# Patient Record
Sex: Male | Born: 1960 | Race: White | Hispanic: No | Marital: Single | Smoking: Never smoker
Health system: Southern US, Community
[De-identification: ages and names within clinical notes are randomized; demographics above are authoritative.]

## PROBLEM LIST (undated history)

## (undated) DIAGNOSIS — F79 Unspecified intellectual disabilities: Secondary | ICD-10-CM

## (undated) DIAGNOSIS — K219 Gastro-esophageal reflux disease without esophagitis: Secondary | ICD-10-CM

## (undated) DIAGNOSIS — Q909 Down syndrome, unspecified: Secondary | ICD-10-CM

---

## 2006-08-20 ENCOUNTER — Observation Stay (HOSPITAL_COMMUNITY): Admission: EM | Admit: 2006-08-20 | Discharge: 2006-08-21 | Payer: Self-pay | Admitting: Emergency Medicine

## 2009-01-17 ENCOUNTER — Encounter: Admission: RE | Admit: 2009-01-17 | Discharge: 2009-01-17 | Payer: Self-pay | Admitting: Family Medicine

## 2009-07-19 ENCOUNTER — Emergency Department (HOSPITAL_COMMUNITY): Admission: EM | Admit: 2009-07-19 | Discharge: 2009-07-19 | Payer: Self-pay | Admitting: Emergency Medicine

## 2010-11-03 ENCOUNTER — Encounter: Payer: Self-pay | Admitting: Family Medicine

## 2011-01-16 LAB — LIPASE, BLOOD: Lipase: 18 U/L (ref 11–59)

## 2011-01-16 LAB — DIFFERENTIAL
Lymphocytes Relative: 5 % — ABNORMAL LOW (ref 12–46)
Monocytes Absolute: 0.6 10*3/uL (ref 0.1–1.0)
Neutrophils Relative %: 91 % — ABNORMAL HIGH (ref 43–77)

## 2011-01-16 LAB — CBC
MCHC: 34.4 g/dL (ref 30.0–36.0)
MCV: 97.1 fL (ref 78.0–100.0)
Platelets: 271 10*3/uL (ref 150–400)
WBC: 17.3 10*3/uL — ABNORMAL HIGH (ref 4.0–10.5)

## 2011-01-16 LAB — COMPREHENSIVE METABOLIC PANEL
AST: 24 U/L (ref 0–37)
BUN: 14 mg/dL (ref 6–23)
CO2: 27 mEq/L (ref 19–32)
Chloride: 107 mEq/L (ref 96–112)
GFR calc Af Amer: >60 mL/min (ref >60)
GFR calc non Af Amer: >60 mL/min (ref >60)
Glucose, Bld: 118 mg/dL — ABNORMAL HIGH (ref 70–99)
Sodium: 142 mEq/L (ref 135–145)

## 2011-02-28 NOTE — H&P (Signed)
NAME:  Brian Stanton, Brian Stanton NO.:  [id number]   MEDICAL RECORD NO.:  [id number]          PATIENT TYPE:  EMS   LOCATION:  MAJO                         FACILITY:  [facility]   PHYSICIAN:  Marcellus [name], MD     DATE OF BIRTH:  [birth year]   DATE OF ADMISSION:  08/20/2006  DATE OF DISCHARGE:                                HISTORY & PHYSICAL   PRIMARY CARE PHYSICIAN:  Dr. Arlyce Dice   The patient is a resident of [facility].   Historians: patient's mother and the supervisor of the day program, first  shift.   CHIEF COMPLAINT:  Intractable Vomiting and clenching of the chest.   HISTORY OF PRESENT ILLNESS:  Brian Stanton, a 50 year old patient with history of Down syndrome, resident of  the [facility], is unable to provide any history because of his Down  syndrome and history of speaking minimally.  History obtained from the  historians. Patient was apparently in usual state of health until 6 pm last  night when multiple episodes of vomiting bilious material which went on  until this am. However there was no history of blood or coffee-ground  material. Unsure if the patient had any history of nausea, but there was no  history of any fevers, chills, or rigors.  No history of diarrhea or  constipation and nothing to indicate abdominal pain.  It is not clear  exactly how many times the patient vomited and the timing of the last  vomitus.  However, per the historians, the patient had breakfast after 7  a.m. this morning and has not vomited since.  However, about 9 a.m. the  patient is said to have been clenching his chest in discomfort of unclear  duration, but no associated dyspnea.  The patient was taken to the primary  medical doctor's office and then subsequently transferred to the emergency  room for evaluation.  Currently, the patient is quite comfortable and in no  obvious pain and has not vomited since being in the emergency room.  There  is no history that patient  consumed any food that is unusual to him, and  there is no history of any similar symptoms in the other residents of the  group home.   PAST MEDICAL HISTORY:  1. Down syndrome.  2. Upper GI bleed more than 20 years ago and admitted to [facility].   PAST SURGICAL HISTORY:  Herniorrhaphy as a child.   ALLERGIES:  No known allergies.   MEDICATIONS:  Nil.   FAMILY HISTORY:  Mom with history of hypertension and hyperlipidemia.  Dad  demised 10 years ago with history of congestive heart failure, diabetes, and  kidney failure.   SOCIAL HISTORY:  The patient is a resident of a group home, speaks  minimally, 1-2 words, but is said to understand what is spoken to him and  follows commands.  He is ambulatory without any assistance.  There is no  history of smoking, alcohol intake, or drugs.   REVIEW OF SYSTEMS:  Difficult to obtain from the patient  because of the  history indicated above.  GENERAL:  No history of fever, chills, rigors, and  pains anywhere apart from the episode clenching of the chest indicated  above.  HEENT:  There is nothing to suggest headache or sore throat.  RESPIRATORY SYSTEM:  No history of dyspnea, cough.  CARDIOVASCULAR:  Per  history of presenting illness, history of clenching of the chest suggestive  of chest discomfort.  We are unable to elicit history of palpitations.  GI:  Per the history of presenting illness.  GENITOURINARY:  Unable to obtain any  details of dysuria, frequency, urgency, but there is no incontinence.  MUSCULOSKELETAL:  No history of pains anywhere else or history of joint  swelling or pain.  SKIN:  There are no rashes.  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   PHYSICAL EXAMINATION:  Overall slightly difficult exam because patient not  fully cooperative.  GENERAL:  The patient is a young, moderately built and nourished male  patient and quite comfortable with no obvious painful or  cardiopulmonary  distress.  VITAL SIGNS:  Temperature 98.1 degrees F.  Pulse 90/min regular,  respirations 22 per minute.  Blood pressure 137/87 and saturation of 97%.  HEENT:  The patient with Down's facies, missing multiple teeth with  questionable caries.  The patient uncooperative to examination of the  throat.  Head is atraumatic and questionable microcephalic.  Pupils round  and equally reactive to light and accommodation.  Extraocular muscle  movements intact. Anicteric. Mucosa mildly dry.  RESPIRATORY SYSTEM:  Poor effort but normal vesicular breath sounds  bilaterally and clear to auscultation.  CARDIOVASCULAR:  First and second heart sounds heard, soft, regular rate and  rhythm.  There are no murmurs, rubs, gallops, or clicks.  ABDOMEN:  It is nondistended, soft, nontender, with no obvious organomegaly  or mass, and bowel sounds are present, questionable hypoactive.  CNS:  The patient awake, alert, makes a few incomprehensible sounds but  otherwise nonverbal, does not obey any commands.  There is no obvious focal  neurological deficits.  EXTREMITIES:  There is no cyanosis, clubbing, or edema.  Peripheral pulses  are present.  External genitalia: examination deferred because of lack of  patient cooperation.  RECTAL EXAMINATION:  Deferred because of lack of patient cooperation.   LABORATORY DATA:  EKG with normal sinus rhythm at 82 beats per minute with  left axis deviation, incomplete right bundle-branch block, no obvious  ischemic changes noted.  Blood work and other lab data are requested and  pending.   ASSESSMENT/PLAN:  Brian Stanton, a 50 year old pleasant patient with history of  Down syndrome now presenting with:   1. Intractable vomiting, possibly acute gastroenteritis, but to rule out      ileus/bowel obstruction.  Plan is admit the patient to telemetry, IV      fluids, diet as tolerated after review of the abdominal x-rays, anti-     nausea and anti-emetic  medications.  We will check urinalysis to rule      out occult urinary tract infection.  2. Chest discomfort - may be due to 1).  Admit to telemetry.  We will      check cardiac enzymes, D-dimer, and monitor on telemetry.  Rule out      acute coronary syndrome.  3. Gastrointestinal prophylaxis.  Protonix.  4. Deep venous thrombosis prophylaxis.  Early ambulation.   Addendum:  CBC, CMET, DDimer, POC Cardiac Markers, Urine analysis were normal.  CXR:  mild bibasilar atelectasis.  Abdominal XRay: Minimal nonspecific ileus.  Patient downgraded to Medical floor. Diet changed to clear liquids and to  monitor. Followup clinically and with repeat Xray Abdomen in am and if OK  for discharge home. Admitted for observation.      Marcellus [name], MD  Electronically Signed     AH/MEDQ  D:  08/20/2006  T:  08/20/2006  Job:  [id number]   cc:   Teena Irani. Arlyce Dice, M.D.

## 2011-11-08 ENCOUNTER — Encounter (HOSPITAL_COMMUNITY): Payer: Self-pay

## 2011-11-08 ENCOUNTER — Emergency Department (HOSPITAL_COMMUNITY): Payer: Medicare Other

## 2011-11-08 ENCOUNTER — Emergency Department (HOSPITAL_COMMUNITY)
Admission: EM | Admit: 2011-11-08 | Discharge: 2011-11-08 | Disposition: A | Discharge status: Home / Self Care | Payer: Medicare Other | Attending: Emergency Medicine | Admitting: Emergency Medicine

## 2011-11-08 DIAGNOSIS — F79 Unspecified intellectual disabilities: Secondary | ICD-10-CM | POA: Insufficient documentation

## 2011-11-08 DIAGNOSIS — L03019 Cellulitis of unspecified finger: Secondary | ICD-10-CM | POA: Insufficient documentation

## 2011-11-08 DIAGNOSIS — Z79899 Other long term (current) drug therapy: Secondary | ICD-10-CM | POA: Insufficient documentation

## 2011-11-08 DIAGNOSIS — IMO0002 Reserved for concepts with insufficient information to code with codable children: Secondary | ICD-10-CM

## 2011-11-08 DIAGNOSIS — L02519 Cutaneous abscess of unspecified hand: Secondary | ICD-10-CM | POA: Insufficient documentation

## 2011-11-08 DIAGNOSIS — L03113 Cellulitis of right upper limb: Secondary | ICD-10-CM

## 2011-11-08 DIAGNOSIS — R609 Edema, unspecified: Secondary | ICD-10-CM | POA: Insufficient documentation

## 2011-11-08 DIAGNOSIS — M7989 Other specified soft tissue disorders: Secondary | ICD-10-CM | POA: Insufficient documentation

## 2011-11-08 HISTORY — DX: Unspecified intellectual disabilities: F79

## 2011-11-08 HISTORY — DX: Down syndrome, unspecified: Q90.9

## 2011-11-08 HISTORY — DX: Gastro-esophageal reflux disease without esophagitis: K21.9

## 2011-11-08 MED ORDER — ACETAMINOPHEN 325 MG PO TABS
650.0000 mg | ORAL_TABLET | Freq: Once | ORAL | Status: AC
  Administered 2011-11-08: 650 mg via ORAL
  Filled 2011-11-08: qty 2

## 2011-11-08 MED ORDER — LIDOCAINE HCL (PF) 1 % IJ SOLN
INTRAMUSCULAR | Status: AC
  Administered 2011-11-08: 13:00:00
  Filled 2011-11-08: qty 5

## 2011-11-08 MED ORDER — CEFTRIAXONE SODIUM 1 G IJ SOLR
1.0000 g | Freq: Once | INTRAMUSCULAR | Status: AC
  Administered 2011-11-08: 1 g via INTRAMUSCULAR
  Filled 2011-11-08: qty 10

## 2011-11-08 MED ORDER — AMOXICILLIN-POT CLAVULANATE 875-125 MG PO TABS: 1.0000 | ORAL_TABLET | Freq: Two times a day (BID) | ORAL | Status: AC

## 2011-11-08 NOTE — ED Provider Notes (Signed)
History     CSN: [id number]  Arrival date & time 11/08/11  1009   First MD Initiated Contact with Patient 11/08/11 1020      Chief Complaint  Patient presents with  . Finger Injury    (Consider location/radiation/quality/duration/timing/severity/associated sxs/prior treatment) HPI History provided by a caretaker.  Pt has h/o mental retardation and comes from a residential living facility.  He is non-verbal.  Nursing staff noticed edema and erythema of right middle finger yesterday evening.  There was drainage from the nail this morning.  No associated fever.  No known trauma.  Pt is behaving normally and does not appear to be uncomfortable.    No past medical history on file.  No past surgical history on file.  No family history on file.  History  Substance Use Topics  . Smoking status: Not on file  . Smokeless tobacco: Not on file  . Alcohol Use: Not on file      Review of Systems  All other systems reviewed and are negative.    Allergies  Review of patient's allergies indicates no known allergies.  Home Medications   Current Outpatient Rx  Name Route Sig Dispense Refill  . HYDROCORTISONE ACETATE 25 MG RE SUPP Rectal Place 25 mg rectally 2 (two) times daily as needed. For hemorrhoids    . OMEPRAZOLE 20 MG PO CPDR Oral Take 20 mg by mouth daily.    Marland Kitchen ONDANSETRON HCL 8 MG PO TABS Oral Take 8 mg by mouth every 6 (six) hours as needed. For nausea    . ZINC OXIDE 11.3 % EX CREA Topical Apply 1 application topically 3 (three) times daily.      BP 150/85  Pulse 80  Temp(Src) 98.3 F (36.8 C) (Oral)  Resp 16  SpO2 96%  Physical Exam  Nursing note and vitals reviewed. Constitutional: He is oriented to person, place, and time. He appears well-developed and well-nourished. No distress.  HENT:  Head: Normocephalic and atraumatic.  Eyes:       Normal appearance  Neck: Normal range of motion.  Musculoskeletal:       Right middle finger uniformly edematous and  diffuse erythema on dorsal surface that spreads into hand. Dried blood and increased edema (appears to be a hematoma) at cuticle. Guarding w/ palpation though pt is still using his right hand.  Full ROM but pt pulls away w/ full flexion.    Neurological: He is alert and oriented to person, place, and time.  Psychiatric: He has a normal mood and affect. His behavior is normal.    ED Course  Procedures (including critical care time)  Labs Reviewed - No data to display Dg Hand Complete Right  11/08/2011  *RADIOLOGY REPORT*  Clinical Data: Right middle finger swelling  RIGHT HAND - COMPLETE 3+ VIEW  Comparison: None  Findings: There is diffuse soft tissue swelling involving the middle finger.  No fractures or subluxations identified.   No radiopaque foreign bodies or soft tissue calcification.  No significant arthropathy.  IMPRESSION:  1.  Soft tissue swelling. 2.  No bony abnormality noted.  Original Report Authenticated By: Rosealee Albee, M.D.     1. Paronychia   2. Cellulitis of right hand       MDM  50yo non-verbal M w/ MR presents w/ edema/erythema and pain of right middle finger.  Appears to have a paronychia w/ cellulitis; had drainage from finger this morning. No known trauma but d/t communication barrier, xray ordered and is pending.  Tylenol ordered for pain.  10:42 AM   Xray shows soft-tissue swelling; no fx.  Paronychia suspected but cuticle lifted and no drainage.  Dr. Orlan Leavens consulted and says that we can remove the nail or part of the nail that affected but is otherwise in agreement w/ plan to d/c home w/ abx and f/u.  Dr. Denton Lank agrees that there is no reason to remove nail.  Pt received 1g IM rocephin in ED and d/c'd home w/ augmentin and referral to Hand.  Margins of cellulitis drawn by nursing staff.  Return precautions discussed.         Arie Sabina [place], [place] 11/08/11 1551

## 2011-11-08 NOTE — ED Notes (Signed)
Pt here for injury to right middle finger. Unknown what occurred, first noticed at group home this am.swelling and redness noted.

## 2011-11-09 NOTE — ED Provider Notes (Signed)
Medical screening examination/treatment/procedure(s) were performed by non-physician practitioner and as supervising physician I was immediately available for consultation/collaboration.   Laray Anger, DO 11/09/11 [phone]

## 2012-01-01 ENCOUNTER — Other Ambulatory Visit: Payer: Self-pay | Admitting: *Deleted

## 2012-01-01 DIAGNOSIS — R3129 Other microscopic hematuria: Secondary | ICD-10-CM

## 2012-01-05 ENCOUNTER — Ambulatory Visit
Admission: RE | Admit: 2012-01-05 | Discharge: 2012-01-05 | Disposition: A | Discharge status: Home / Self Care | Payer: Medicare Other | Source: Ambulatory Visit | Attending: *Deleted | Admitting: *Deleted

## 2012-01-05 DIAGNOSIS — R3129 Other microscopic hematuria: Secondary | ICD-10-CM

## 2012-01-07 ENCOUNTER — Other Ambulatory Visit (HOSPITAL_COMMUNITY): Payer: Self-pay | Admitting: Urology

## 2012-01-07 DIAGNOSIS — R310 Gross hematuria: Secondary | ICD-10-CM

## 2012-01-16 ENCOUNTER — Ambulatory Visit (HOSPITAL_COMMUNITY)
Admission: RE | Admit: 2012-01-16 | Discharge: 2012-01-16 | Disposition: A | Discharge status: Home / Self Care | Payer: Medicare Other | Source: Ambulatory Visit | Attending: Urology | Admitting: Urology

## 2012-01-16 ENCOUNTER — Encounter (HOSPITAL_COMMUNITY): Payer: Self-pay

## 2012-01-16 DIAGNOSIS — N2889 Other specified disorders of kidney and ureter: Secondary | ICD-10-CM | POA: Insufficient documentation

## 2012-01-16 DIAGNOSIS — Q539 Undescended testicle, unspecified: Secondary | ICD-10-CM | POA: Insufficient documentation

## 2012-01-16 DIAGNOSIS — R310 Gross hematuria: Secondary | ICD-10-CM

## 2012-01-16 MED ORDER — IOHEXOL 300 MG/ML  SOLN
125.0000 mL | Freq: Once | INTRAMUSCULAR | Status: AC | PRN
  Administered 2012-01-16: 125 mL via INTRAVENOUS

## 2015-07-23 ENCOUNTER — Other Ambulatory Visit (HOSPITAL_COMMUNITY): Payer: Self-pay | Admitting: Family Medicine

## 2015-07-23 DIAGNOSIS — K922 Gastrointestinal hemorrhage, unspecified: Secondary | ICD-10-CM

## 2015-07-23 DIAGNOSIS — Q909 Down syndrome, unspecified: Secondary | ICD-10-CM

## 2015-07-23 DIAGNOSIS — R1310 Dysphagia, unspecified: Secondary | ICD-10-CM

## 2015-07-27 ENCOUNTER — Ambulatory Visit (HOSPITAL_COMMUNITY)
Admission: RE | Admit: 2015-07-27 | Discharge: 2015-07-27 | Disposition: A | Discharge status: Home / Self Care | Payer: Medicare Other | Source: Ambulatory Visit | Attending: Family Medicine | Admitting: Family Medicine

## 2015-07-27 DIAGNOSIS — R05 Cough: Secondary | ICD-10-CM | POA: Diagnosis not present

## 2015-07-27 DIAGNOSIS — R1310 Dysphagia, unspecified: Secondary | ICD-10-CM

## 2015-07-27 DIAGNOSIS — Q909 Down syndrome, unspecified: Secondary | ICD-10-CM

## 2015-07-27 DIAGNOSIS — K922 Gastrointestinal hemorrhage, unspecified: Secondary | ICD-10-CM

## 2019-03-29 ENCOUNTER — Emergency Department (HOSPITAL_COMMUNITY)
Admission: EM | Admit: 2019-03-29 | Discharge: 2019-03-29 | Disposition: A | Discharge status: Skilled Nursing Facility | Payer: Medicare Other | Attending: Emergency Medicine | Admitting: Emergency Medicine

## 2019-03-29 ENCOUNTER — Encounter (HOSPITAL_COMMUNITY): Payer: Self-pay

## 2019-03-29 DIAGNOSIS — F0390 Unspecified dementia without behavioral disturbance: Secondary | ICD-10-CM | POA: Diagnosis not present

## 2019-03-29 DIAGNOSIS — Q909 Down syndrome, unspecified: Secondary | ICD-10-CM | POA: Insufficient documentation

## 2019-03-29 DIAGNOSIS — Z20828 Contact with and (suspected) exposure to other viral communicable diseases: Secondary | ICD-10-CM | POA: Diagnosis not present

## 2019-03-29 DIAGNOSIS — Z79899 Other long term (current) drug therapy: Secondary | ICD-10-CM | POA: Insufficient documentation

## 2019-03-29 DIAGNOSIS — E860 Dehydration: Secondary | ICD-10-CM | POA: Diagnosis not present

## 2019-03-29 DIAGNOSIS — R6251 Failure to thrive (child): Secondary | ICD-10-CM

## 2019-03-29 DIAGNOSIS — R627 Adult failure to thrive: Secondary | ICD-10-CM | POA: Diagnosis not present

## 2019-03-29 DIAGNOSIS — R630 Anorexia: Secondary | ICD-10-CM | POA: Diagnosis present

## 2019-03-29 LAB — CBC WITH DIFFERENTIAL/PLATELET
Abs Immature Granulocytes: 0.02 10*3/uL (ref 0.00–0.07)
Basophils Absolute: 0.1 10*3/uL (ref 0.0–0.1)
Basophils Relative: 1 %
Eosinophils Absolute: 0.1 10*3/uL (ref 0.0–0.5)
Eosinophils Relative: 1 %
HCT: 50.1 % (ref 39.0–52.0)
Hemoglobin: 16.4 g/dL (ref 13.0–17.0)
Immature Granulocytes: 0 %
Lymphocytes Relative: 15 %
Lymphs Abs: 1.1 10*3/uL (ref 0.7–4.0)
MCH: 33.3 pg (ref 26.0–34.0)
MCHC: 32.7 g/dL (ref 30.0–36.0)
MCV: 101.6 fL — ABNORMAL HIGH (ref 80.0–100.0)
Monocytes Absolute: 0.9 10*3/uL (ref 0.1–1.0)
Monocytes Relative: 12 %
Neutro Abs: 5.3 10*3/uL (ref 1.7–7.7)
Neutrophils Relative %: 71 %
Platelets: 323 10*3/uL (ref 150–400)
RBC: 4.93 MIL/uL (ref 4.22–5.81)
RDW: 13.2 % (ref 11.5–15.5)
WBC: 7.4 10*3/uL (ref 4.0–10.5)
nRBC: 0 % (ref 0.0–0.2)

## 2019-03-29 LAB — COMPREHENSIVE METABOLIC PANEL
ALT: 14 U/L (ref 0–44)
AST: 18 U/L (ref 15–41)
Albumin: 3.3 g/dL — ABNORMAL LOW (ref 3.5–5.0)
Alkaline Phosphatase: 68 U/L (ref 38–126)
Anion gap: 11 (ref 5–15)
BUN: 17 mg/dL (ref 6–20)
CO2: 33 mmol/L — ABNORMAL HIGH (ref 22–32)
Calcium: 9.3 mg/dL (ref 8.9–10.3)
Chloride: 99 mmol/L (ref 98–111)
Creatinine, Ser: 1.05 mg/dL (ref 0.61–1.24)
GFR calc Af Amer: >60 mL/min (ref >60)
GFR calc non Af Amer: >60 mL/min (ref >60)
Glucose, Bld: 130 mg/dL — ABNORMAL HIGH (ref 70–99)
Potassium: 3.8 mmol/L (ref 3.5–5.1)
Sodium: 143 mmol/L (ref 135–145)
Total Bilirubin: 0.4 mg/dL (ref 0.3–1.2)
Total Protein: 7.8 g/dL (ref 6.5–8.1)

## 2019-03-29 LAB — URINALYSIS, ROUTINE W REFLEX MICROSCOPIC
Bilirubin Urine: NEGATIVE
Glucose, UA: NEGATIVE mg/dL
Hgb urine dipstick: NEGATIVE
Ketones, ur: NEGATIVE mg/dL
Leukocytes,Ua: NEGATIVE
Nitrite: NEGATIVE
Protein, ur: NEGATIVE mg/dL
Specific Gravity, Urine: 1.012 (ref 1.005–1.030)
pH: 7 (ref 5.0–8.0)

## 2019-03-29 LAB — SARS CORONAVIRUS 2 BY RT PCR (HOSPITAL ORDER, PERFORMED IN ~~LOC~~ HOSPITAL LAB): SARS Coronavirus 2: NEGATIVE

## 2019-03-29 MED ORDER — SODIUM CHLORIDE 0.9 % IV BOLUS
1000.0000 mL | Freq: Once | INTRAVENOUS | Status: AC
  Administered 2019-03-29: 21:00:00 1000 mL via INTRAVENOUS

## 2019-03-29 MED ORDER — LORAZEPAM 2 MG/ML IJ SOLN
1.0000 mg | Freq: Once | INTRAMUSCULAR | Status: AC
  Administered 2019-03-29: 1 mg via INTRAVENOUS
  Filled 2019-03-29: qty 1

## 2019-03-29 MED ORDER — SODIUM CHLORIDE 0.9 % IV BOLUS
1000.0000 mL | Freq: Once | INTRAVENOUS | Status: AC
  Administered 2019-03-29: 18:00:00 1000 mL via INTRAVENOUS

## 2019-03-29 MED ORDER — SODIUM CHLORIDE 0.9 % IV SOLN
INTRAVENOUS | Status: DC
Start: 2019-03-29 — End: 2019-03-30
  Administered 2019-03-29: 21:00:00 via INTRAVENOUS

## 2019-03-29 NOTE — ED Notes (Addendum)
Limited assessment d/t pt not cooperative with assessment. Pt not allowing this nurse to put bp cuff on arm, pt grabs cuff off his arm, pt grabbing at this nurses hands. Pt non verbal

## 2019-03-29 NOTE — ED Notes (Signed)
Bed: WA09 Expected date:  Expected time:  Means of arrival:  Comments: EMS-downsyndrome

## 2019-03-29 NOTE — ED Triage Notes (Signed)
Pt to ED via EMS from [facility]. Pt has hx of Downs Syndrome and Dementia, facility states pt is not eating over the past 3 days. Hx of aggression. Pt orientation at baseline per EMS.

## 2019-03-29 NOTE — Discharge Instructions (Signed)
Patient may be suffering from depression or worsening dementia related to the quarantine.  Does not appear to have any infectious findings today.  No sign of urinary tract infection and oxygen saturation is within normal limits.

## 2019-03-29 NOTE — ED Provider Notes (Addendum)
[facility] DEPT Provider Note   CSN: [id number] Arrival date & time: 03/29/19  1701     History   Chief Complaint Chief Complaint  Patient presents with   not eating    HPI Brian Stanton is a 58 y.o. male.     Patient is a 58 year old male with a history of Down syndrome, dementia and GERD who is presenting from his group home because for the last 3 days he is refused to eat or drink.  Speaking with the nurse who takes care of him she states that he normally is up throughout the day and reading his books.  At baseline he is nonverbal but he usually is a good eater.  For the last 3 days he is refused to eat or drink.  He is also laying in bed constantly.  He has had no nausea, vomiting, cough or fever that they are aware of.  He has had no recent medication changes.  He does have a history of aggression but has not had overt aggression in the last 3 days.  The history is provided by a caregiver and the EMS personnel. The history is limited by a developmental delay.    Past Medical History:  Diagnosis Date   Down's syndrome    GERD (gastroesophageal reflux disease)    Mental retardation     There are no active problems to display for this patient.   History reviewed. No pertinent surgical history.      Home Medications    Prior to Admission medications   Medication Sig Start Date End Date Taking? Authorizing Provider  hydrocortisone (ANUSOL-HC) 25 MG suppository Place 25 mg rectally 2 (two) times daily as needed. For hemorrhoids    [provider]  omeprazole (PRILOSEC) 20 MG capsule Take 20 mg by mouth daily.    [provider]  ondansetron (ZOFRAN) 8 MG tablet Take 8 mg by mouth every 6 (six) hours as needed. For nausea    [provider]  zinc oxide (BALMEX) 11.3 % CREA cream Apply 1 application topically 3 (three) times daily.    [provider]    Family History History reviewed. No pertinent  family history.  Social History Social History   Tobacco Use   Smoking status: Never Smoker  Substance Use Topics   Alcohol use: No   Drug use: No     Allergies   Patient has no known allergies.   Review of Systems Review of Systems  Unable to perform ROS: Dementia     Physical Exam Updated Vital Signs BP (!) 124/100 (BP Location: Right Arm)    Pulse 87    Temp 97.6 F (36.4 C) (Oral)    Resp 20    SpO2 97%   Physical Exam Vitals signs and nursing note reviewed.  Constitutional:      General: He is not in acute distress.    Appearance: He is well-developed.  HENT:     Head: Normocephalic and atraumatic.     Nose: Nose normal.     Mouth/Throat:     Mouth: Mucous membranes are dry.  Eyes:     Conjunctiva/sclera: Conjunctivae normal.     Pupils: Pupils are equal, round, and reactive to light.  Neck:     Musculoskeletal: Normal range of motion and neck supple.  Cardiovascular:     Rate and Rhythm: Normal rate and regular rhythm.     Heart sounds: No murmur.  Pulmonary:  Effort: Pulmonary effort is normal. No respiratory distress.     Breath sounds: Normal breath sounds. No wheezing or rales.  Abdominal:     General: There is no distension.     Palpations: Abdomen is soft.     Tenderness: There is no abdominal tenderness. There is no guarding or rebound.  Musculoskeletal: Normal range of motion.        General: No tenderness.     Comments: Spoon shaped nails  Skin:    General: Skin is warm and dry.     Capillary Refill: Capillary refill takes 2 to 3 seconds.     Findings: No erythema or rash.     Comments: Poor skin turgor  Neurological:     Mental Status: He is alert.     Comments: Pt is not cooperative and trying to get out of bed.  Will not speak or follow commands.  Moving all ext and seems to have normal strength and coordination.  Psychiatric:     Comments: Will grab my hand but will not squeeze.  Not combative at this time but does not what to  be touched.  Non-verbal      ED Treatments / Results  Labs (all labs ordered are listed, but only abnormal results are displayed) Labs Reviewed  CBC WITH DIFFERENTIAL/PLATELET - Abnormal; Notable for the following components:      Result Value   MCV 101.6 (*)    All other components within normal limits  COMPREHENSIVE METABOLIC PANEL - Abnormal; Notable for the following components:   CO2 33 (*)    Glucose, Bld 130 (*)    Albumin 3.3 (*)    All other components within normal limits  URINALYSIS, ROUTINE W REFLEX MICROSCOPIC - Abnormal; Notable for the following components:   APPearance HAZY (*)    All other components within normal limits  SARS CORONAVIRUS 2 (HOSPITAL ORDER, PERFORMED IN [facility])    EKG    Radiology No results found.  Procedures Procedures (including critical care time)  Medications Ordered in ED Medications  sodium chloride 0.9 % bolus 1,000 mL (has no administration in time range)  0.9 %  sodium chloride infusion (has no administration in time range)  LORazepam (ATIVAN) injection 1 mg (has no administration in time range)     Initial Impression / Assessment and Plan / ED Course  I have reviewed the triage vital signs and the nursing notes.  Pertinent labs & imaging results that were available during my care of the patient were reviewed by me and considered in my medical decision making (see chart for details).        58 year old male presenting today with refusing to eat and drink for the last 3 days.  Patient does have a history of Down syndrome and dementia and is nonverbal at baseline.  On exam patient appears dehydrated with significant dry mouth but has a normal blood pressure and heart rate.  Patient has not had fever per group home but states he normally eats without difficulty.  There is been no recent medication changes and he is still sleeping at night.  Concern for possible underlying infection or cause for his symptoms.   Facility gave permission to get patient's blood and give IV fluids as he does not want us to touch him.  9:01 PM Labs without significant findings.  No sign of AKI or leukocytosis.  Patient is receiving IV fluids.  He is calm her after having Ativan.  He  is still refusing to drink.  Patient has had no people at his facility positive for COVID and will test to make sure this is not an underlying source.  10:17 PM Urine testing is within normal limits.  Patient's vital signs are otherwise normal.  Spoke with the facility nurse and reported patient's results.  There is someone to take the patient home.  May be related to his dementia and may be possible depression as he has been quarantined for quite some time.  Brian Stanton was evaluated in Emergency Department on 03/29/2019 for the symptoms described in the history of present illness. He was evaluated in the context of the global COVID-19 pandemic, which necessitated consideration that the patient might be at risk for infection with the SARS-CoV-2 virus that causes COVID-19. Institutional protocols and algorithms that pertain to the evaluation of patients at risk for COVID-19 are in a state of rapid change based on information released by regulatory bodies including the CDC and federal and state organizations. These policies and algorithms were followed during the patient's care in the ED.   Final Clinical Impressions(s) / ED Diagnoses   Final diagnoses:  Failure to thrive (0-17)  Dehydration    ED Discharge Orders    None       Gwyneth SproutPlunkett, Willia Genrich, MD 03/29/19 2220    Gwyneth SproutPlunkett, Rudy Luhmann, MD 03/29/19 2227

## 2019-03-29 NOTE — ED Notes (Addendum)
Pt continues to try and pull out IV site, safety mitts applied, EDP Plunkett aware.

## 2019-03-29 NOTE — ED Notes (Signed)
Safety sitter at bedside 

## 2019-04-08 ENCOUNTER — Other Ambulatory Visit: Payer: Self-pay | Admitting: Internal Medicine

## 2019-04-08 ENCOUNTER — Other Ambulatory Visit: Payer: Medicare Other

## 2019-04-08 DIAGNOSIS — Z20822 Contact with and (suspected) exposure to covid-19: Secondary | ICD-10-CM

## 2019-04-08 DIAGNOSIS — Z20828 Contact with and (suspected) exposure to other viral communicable diseases: Secondary | ICD-10-CM

## 2019-04-14 ENCOUNTER — Telehealth: Payer: Self-pay | Admitting: *Deleted

## 2019-04-14 LAB — NOVEL CORONAVIRUS, NAA: SARS-CoV-2, NAA: NOT DETECTED

## 2019-04-14 NOTE — Telephone Encounter (Signed)
Brian Stanton, program manager with [facility] notified of negative COVID-19 results. Understanding verbalized.  

## 2019-07-05 ENCOUNTER — Encounter (HOSPITAL_COMMUNITY): Payer: Self-pay

## 2019-07-05 ENCOUNTER — Inpatient Hospital Stay (HOSPITAL_COMMUNITY)
Admission: EM | Admit: 2019-07-05 | Discharge: 2019-07-13 | DRG: 469 | Disposition: A | Discharge status: Skilled Nursing Facility | Payer: Medicare Other | Attending: Internal Medicine | Admitting: Internal Medicine

## 2019-07-05 ENCOUNTER — Emergency Department (HOSPITAL_COMMUNITY): Payer: Medicare Other

## 2019-07-05 ENCOUNTER — Other Ambulatory Visit: Payer: Self-pay

## 2019-07-05 DIAGNOSIS — Z79899 Other long term (current) drug therapy: Secondary | ICD-10-CM

## 2019-07-05 DIAGNOSIS — D62 Acute posthemorrhagic anemia: Secondary | ICD-10-CM

## 2019-07-05 DIAGNOSIS — R740 Nonspecific elevation of levels of transaminase and lactic acid dehydrogenase [LDH]: Secondary | ICD-10-CM | POA: Diagnosis present

## 2019-07-05 DIAGNOSIS — Q909 Down syndrome, unspecified: Secondary | ICD-10-CM

## 2019-07-05 DIAGNOSIS — N131 Hydronephrosis with ureteral stricture, not elsewhere classified: Secondary | ICD-10-CM | POA: Diagnosis present

## 2019-07-05 DIAGNOSIS — Z419 Encounter for procedure for purposes other than remedying health state, unspecified: Secondary | ICD-10-CM

## 2019-07-05 DIAGNOSIS — D696 Thrombocytopenia, unspecified: Secondary | ICD-10-CM | POA: Diagnosis not present

## 2019-07-05 DIAGNOSIS — S72001A Fracture of unspecified part of neck of right femur, initial encounter for closed fracture: Secondary | ICD-10-CM | POA: Diagnosis not present

## 2019-07-05 DIAGNOSIS — M8580 Other specified disorders of bone density and structure, unspecified site: Secondary | ICD-10-CM | POA: Diagnosis present

## 2019-07-05 DIAGNOSIS — Z20828 Contact with and (suspected) exposure to other viral communicable diseases: Secondary | ICD-10-CM | POA: Diagnosis present

## 2019-07-05 DIAGNOSIS — S72145A Nondisplaced intertrochanteric fracture of left femur, initial encounter for closed fracture: Principal | ICD-10-CM

## 2019-07-05 DIAGNOSIS — E870 Hyperosmolality and hypernatremia: Secondary | ICD-10-CM | POA: Diagnosis not present

## 2019-07-05 DIAGNOSIS — S72009A Fracture of unspecified part of neck of unspecified femur, initial encounter for closed fracture: Secondary | ICD-10-CM | POA: Diagnosis present

## 2019-07-05 DIAGNOSIS — E878 Other disorders of electrolyte and fluid balance, not elsewhere classified: Secondary | ICD-10-CM | POA: Diagnosis not present

## 2019-07-05 DIAGNOSIS — R338 Other retention of urine: Secondary | ICD-10-CM

## 2019-07-05 DIAGNOSIS — F0390 Unspecified dementia without behavioral disturbance: Secondary | ICD-10-CM | POA: Diagnosis present

## 2019-07-05 DIAGNOSIS — Z79890 Hormone replacement therapy (postmenopausal): Secondary | ICD-10-CM | POA: Diagnosis not present

## 2019-07-05 DIAGNOSIS — E876 Hypokalemia: Secondary | ICD-10-CM | POA: Diagnosis not present

## 2019-07-05 DIAGNOSIS — S72011A Unspecified intracapsular fracture of right femur, initial encounter for closed fracture: Secondary | ICD-10-CM | POA: Diagnosis present

## 2019-07-05 DIAGNOSIS — E039 Hypothyroidism, unspecified: Secondary | ICD-10-CM | POA: Diagnosis present

## 2019-07-05 DIAGNOSIS — D72829 Elevated white blood cell count, unspecified: Secondary | ICD-10-CM | POA: Diagnosis not present

## 2019-07-05 DIAGNOSIS — K219 Gastro-esophageal reflux disease without esophagitis: Secondary | ICD-10-CM | POA: Diagnosis present

## 2019-07-05 DIAGNOSIS — R0602 Shortness of breath: Secondary | ICD-10-CM

## 2019-07-05 DIAGNOSIS — Z9889 Other specified postprocedural states: Secondary | ICD-10-CM

## 2019-07-05 DIAGNOSIS — R52 Pain, unspecified: Secondary | ICD-10-CM

## 2019-07-05 DIAGNOSIS — R339 Retention of urine, unspecified: Secondary | ICD-10-CM | POA: Diagnosis present

## 2019-07-05 DIAGNOSIS — S72002A Fracture of unspecified part of neck of left femur, initial encounter for closed fracture: Secondary | ICD-10-CM | POA: Diagnosis not present

## 2019-07-05 DIAGNOSIS — J189 Pneumonia, unspecified organism: Secondary | ICD-10-CM | POA: Diagnosis not present

## 2019-07-05 DIAGNOSIS — S72142A Displaced intertrochanteric fracture of left femur, initial encounter for closed fracture: Secondary | ICD-10-CM | POA: Diagnosis present

## 2019-07-05 DIAGNOSIS — X58XXXA Exposure to other specified factors, initial encounter: Secondary | ICD-10-CM | POA: Diagnosis present

## 2019-07-05 LAB — TROPONIN I (HIGH SENSITIVITY)
Troponin I (High Sensitivity): 17 ng/L (ref <18)
Troponin I (High Sensitivity): 19 ng/L — ABNORMAL HIGH (ref <18)

## 2019-07-05 LAB — CBC WITH DIFFERENTIAL/PLATELET
Abs Immature Granulocytes: 0.07 10*3/uL (ref 0.00–0.07)
Basophils Absolute: 0.1 10*3/uL (ref 0.0–0.1)
Basophils Relative: 0 %
Eosinophils Absolute: 0 10*3/uL (ref 0.0–0.5)
Eosinophils Relative: 0 %
HCT: 46 % (ref 39.0–52.0)
Hemoglobin: 15.2 g/dL (ref 13.0–17.0)
Immature Granulocytes: 1 %
Lymphocytes Relative: 4 %
Lymphs Abs: 0.5 10*3/uL — ABNORMAL LOW (ref 0.7–4.0)
MCH: 34.3 pg — ABNORMAL HIGH (ref 26.0–34.0)
MCHC: 33 g/dL (ref 30.0–36.0)
MCV: 103.8 fL — ABNORMAL HIGH (ref 80.0–100.0)
Monocytes Absolute: 0.7 10*3/uL (ref 0.1–1.0)
Monocytes Relative: 5 %
Neutro Abs: 12.5 10*3/uL — ABNORMAL HIGH (ref 1.7–7.7)
Neutrophils Relative %: 90 %
Platelets: 270 10*3/uL (ref 150–400)
RBC: 4.43 MIL/uL (ref 4.22–5.81)
RDW: 13.9 % (ref 11.5–15.5)
WBC: 13.8 10*3/uL — ABNORMAL HIGH (ref 4.0–10.5)
nRBC: 0 % (ref 0.0–0.2)

## 2019-07-05 LAB — URINALYSIS, ROUTINE W REFLEX MICROSCOPIC
Bilirubin Urine: NEGATIVE
Glucose, UA: NEGATIVE mg/dL
Hgb urine dipstick: NEGATIVE
Ketones, ur: NEGATIVE mg/dL
Leukocytes,Ua: NEGATIVE
Nitrite: NEGATIVE
Protein, ur: NEGATIVE mg/dL
Specific Gravity, Urine: 1.014 (ref 1.005–1.030)
pH: 7 (ref 5.0–8.0)

## 2019-07-05 LAB — CBG MONITORING, ED: Glucose-Capillary: 119 mg/dL — ABNORMAL HIGH (ref 70–99)

## 2019-07-05 LAB — COMPREHENSIVE METABOLIC PANEL
ALT: 26 U/L (ref 0–44)
AST: 45 U/L — ABNORMAL HIGH (ref 15–41)
Albumin: 3.6 g/dL (ref 3.5–5.0)
Alkaline Phosphatase: 62 U/L (ref 38–126)
Anion gap: 12 (ref 5–15)
BUN: 22 mg/dL — ABNORMAL HIGH (ref 6–20)
CO2: 25 mmol/L (ref 22–32)
Calcium: 8.9 mg/dL (ref 8.9–10.3)
Chloride: 103 mmol/L (ref 98–111)
Creatinine, Ser: 1.18 mg/dL (ref 0.61–1.24)
GFR calc Af Amer: >60 mL/min (ref >60)
GFR calc non Af Amer: >60 mL/min (ref >60)
Glucose, Bld: 146 mg/dL — ABNORMAL HIGH (ref 70–99)
Potassium: 3.6 mmol/L (ref 3.5–5.1)
Sodium: 140 mmol/L (ref 135–145)
Total Bilirubin: 1 mg/dL (ref 0.3–1.2)
Total Protein: 7.7 g/dL (ref 6.5–8.1)

## 2019-07-05 LAB — LACTIC ACID, PLASMA
Lactic Acid, Venous: 4.3 mmol/L (ref 0.5–1.9)
Lactic Acid, Venous: 3.2 mmol/L (ref 0.5–1.9)

## 2019-07-05 LAB — T4, FREE: Free T4: 1.1 ng/dL (ref 0.61–1.12)

## 2019-07-05 LAB — PROCALCITONIN: Procalcitonin: 5.18 ng/mL

## 2019-07-05 LAB — SARS CORONAVIRUS 2 BY RT PCR (HOSPITAL ORDER, PERFORMED IN ~~LOC~~ HOSPITAL LAB): SARS Coronavirus 2: NEGATIVE

## 2019-07-05 LAB — CK: Total CK: 791 U/L — ABNORMAL HIGH (ref 49–397)

## 2019-07-05 LAB — TSH: TSH: 4.592 u[IU]/mL — ABNORMAL HIGH (ref 0.350–4.500)

## 2019-07-05 MED ORDER — MELATONIN 3 MG PO TABS
3.0000 mg | ORAL_TABLET | Freq: Every day | ORAL | Status: DC
  Administered 2019-07-07 – 2019-07-12 (×6): 3 mg via ORAL
  Filled 2019-07-05 (×8): qty 1

## 2019-07-05 MED ORDER — MORPHINE SULFATE (PF) 2 MG/ML IV SOLN
0.5000 mg | INTRAVENOUS | Status: DC | PRN
  Administered 2019-07-06: 0.5 mg via INTRAVENOUS
  Filled 2019-07-05: qty 1

## 2019-07-05 MED ORDER — TRAZODONE HCL 50 MG PO TABS
25.0000 mg | ORAL_TABLET | Freq: Every day | ORAL | Status: DC
  Administered 2019-07-05 – 2019-07-12 (×7): 25 mg via ORAL
  Filled 2019-07-05 (×7): qty 1

## 2019-07-05 MED ORDER — SODIUM CHLORIDE 0.9 % IV SOLN
500.0000 mg | INTRAVENOUS | Status: DC
  Administered 2019-07-06 – 2019-07-07 (×2): 500 mg via INTRAVENOUS
  Filled 2019-07-05 (×4): qty 500

## 2019-07-05 MED ORDER — LUBIPROSTONE 24 MCG PO CAPS
24.0000 ug | ORAL_CAPSULE | Freq: Two times a day (BID) | ORAL | Status: DC
  Administered 2019-07-06 – 2019-07-12 (×14): 24 ug via ORAL
  Filled 2019-07-05 (×16): qty 1

## 2019-07-05 MED ORDER — IOHEXOL 300 MG/ML  SOLN
100.0000 mL | Freq: Once | INTRAMUSCULAR | Status: AC | PRN
  Administered 2019-07-05: 100 mL via INTRAVENOUS

## 2019-07-05 MED ORDER — DONEPEZIL HCL 5 MG PO TABS
5.0000 mg | ORAL_TABLET | Freq: Every day | ORAL | Status: DC
  Administered 2019-07-06 – 2019-07-12 (×7): 5 mg via ORAL
  Filled 2019-07-05 (×8): qty 1

## 2019-07-05 MED ORDER — LORAZEPAM 2 MG/ML IJ SOLN
2.0000 mg | Freq: Once | INTRAMUSCULAR | Status: DC
  Filled 2019-07-05: qty 1

## 2019-07-05 MED ORDER — SODIUM CHLORIDE (PF) 0.9 % IJ SOLN
INTRAMUSCULAR | Status: AC
  Filled 2019-07-05: qty 50

## 2019-07-05 MED ORDER — MEMANTINE HCL ER 28 MG PO CP24
28.0000 mg | ORAL_CAPSULE | Freq: Every day | ORAL | Status: DC
  Administered 2019-07-06 – 2019-07-12 (×7): 28 mg via ORAL
  Filled 2019-07-05 (×8): qty 1

## 2019-07-05 MED ORDER — SODIUM CHLORIDE 0.9 % IV SOLN
2.0000 g | INTRAVENOUS | Status: DC
  Administered 2019-07-05 – 2019-07-07 (×2): 2 g via INTRAVENOUS
  Filled 2019-07-05 (×2): qty 20
  Filled 2019-07-05: qty 2
  Filled 2019-07-05: qty 20

## 2019-07-05 MED ORDER — GUAIFENESIN ER 600 MG PO TB12
600.0000 mg | ORAL_TABLET | Freq: Two times a day (BID) | ORAL | Status: DC
  Administered 2019-07-07 – 2019-07-12 (×11): 600 mg via ORAL
  Filled 2019-07-05 (×14): qty 1

## 2019-07-05 MED ORDER — DOCUSATE SODIUM 100 MG PO CAPS
100.0000 mg | ORAL_CAPSULE | Freq: Two times a day (BID) | ORAL | Status: DC
  Filled 2019-07-05: qty 1

## 2019-07-05 MED ORDER — LEVOTHYROXINE SODIUM 88 MCG PO TABS
88.0000 ug | ORAL_TABLET | Freq: Every day | ORAL | Status: DC
  Filled 2019-07-05: qty 1

## 2019-07-05 MED ORDER — HALOPERIDOL LACTATE 5 MG/ML IJ SOLN
5.0000 mg | Freq: Four times a day (QID) | INTRAMUSCULAR | Status: DC | PRN
  Administered 2019-07-05 (×2): 5 mg via INTRAMUSCULAR
  Filled 2019-07-05 (×2): qty 1

## 2019-07-05 MED ORDER — MORPHINE SULFATE (PF) 4 MG/ML IV SOLN
4.0000 mg | Freq: Once | INTRAVENOUS | Status: AC
  Administered 2019-07-05: 4 mg via INTRAVENOUS

## 2019-07-05 MED ORDER — MIRABEGRON ER 25 MG PO TB24
25.0000 mg | ORAL_TABLET | ORAL | Status: DC
  Administered 2019-07-06 – 2019-07-12 (×7): 25 mg via ORAL
  Filled 2019-07-05 (×8): qty 1

## 2019-07-05 MED ORDER — MORPHINE SULFATE (PF) 4 MG/ML IV SOLN
4.0000 mg | Freq: Once | INTRAVENOUS | Status: DC
  Filled 2019-07-05: qty 1

## 2019-07-05 NOTE — ED Notes (Addendum)
While RN Ali Lowe, RN Patty and Nurse Daphine Deutscher have cleaned patient. Patient had dry feces stuck to scrotum and buttocks. Patient also appears to have either dirt or most likely what appears to be dried feces on and under fingernails. Patient dental hygiene is extremely poor and appears to be not well maintained along with physical hygiene. RN has informed ED Provider.

## 2019-07-05 NOTE — ED Notes (Signed)
Pt fighting staff and will not allow staff to place EKG leads or touch him.

## 2019-07-05 NOTE — ED Notes (Signed)
Patient's care giver has been updated-please let facility know if he is to be admitted

## 2019-07-05 NOTE — Progress Notes (Signed)
CSW called pt's mother Shmiel Morton at ph: [phone] and it is apparent that pt's mother is very, very hard of hearing.  [name] called her son Nicki Reaper and repeatedly asked what was wrong with, "[name]" and could not seem to understand anything the CSW was saying even though the CSW resorted to yelling into the phone using the, "speaker" mode.  [name] stated she was unaware of a relative being in the hospital.  CSW was finally able to, after much effort, obtain pt's mother's permission to speak to pt's daughter Vaughan Basta regarding pt's case.  CSW called pt's daughter Sevon Rotert at ph: [phone] who stated she is co-guardian with the pt's mother and stated that she was called by the pt's group home who  Per pt's sister stated her mother was probably without her hearing aides  Per pt's sister, pt has been with [facility] in a facility across from [employer] in what is usually an [place] facility and then went to the facility he is at now.  [name] stated they were suspicious for some time that the facility would not let them see the pt or vica versa and that she feels that the facility heavily sedates pt's and that she has signed papers allowing the facility to utilize interventions to be able to provide dentistry services to the pt.  [name] states the group stated pt has stated pt has dementia and has stated that the pt has been given meds for that.  [name] stated she has no plans if allegations are true and that she hasn't seen the pt since the pt's group home was shut down and that this is something she can't see preventing or finding a solution to due to the pandemic.  [name] stated she would like [facility]PS to follow up and investigate these allegations looked into and that the right, caring individual can keep the pt calm and that if they are not able to do that then this "is a problem"  CSW will continue to follow for D/C needs.  Alphonse Guild. Pavneet Markwood,  LCSW, LCAS, CSI Transitions of Care Clinical Social Worker Care Coordination Department Ph: [phone]

## 2019-07-05 NOTE — ED Notes (Signed)
I&O urine specimen per MD order. Assistance received from Patty/Royce RN to complete procedure. Prior to performing procedure, pt provided peri care (hospital approved skin cleanser, skin barrier cream, water) d/t soiled diaper and dried feces in/around penis, hips and buttocks area. Pt tolerated well. [employer]

## 2019-07-05 NOTE — Progress Notes (Addendum)
APS report filed.    APS worker Ernest Pine asked that CSW confirm mother's guardianship status and ask that CSW find out who handles the pt's money and also what the mother's reaction to the allegations and what mother's plan" is as a result of the suspicions/allegations/report.  8:04 PM CSW called pt's sister who is the court-appointed co-legal guardian with the pt's mother, but who states pt's mother is older (63) somewhat confused easily and usually does not have her hearing aids in and should not be called and updated her.  [name] would like the group home to be investigated and the workers interviewed and states that group home has not allowed anyone in or out since the [place] pandemic has started, was seemingly heavily sedating pt's fellow group home members prior to that and that she was told by the group home pt "has dementia and needs dementia meds as a result and also to perform dental treatment" although pt has never officially received a dementia diagnosis.  Per picture provided by the EDP, apparently pt has not received dental [place] either.  8:15 PM CSW spoke again to [place] worker Ernest Pine, updated her and emailed her pictures in a [place] email of pt's teeth which present as neglected and with cavities, pt's eyes with evidence of conjunctivitis and fingers,  and fingernails which have been uploaded to show the evidence of desiccated feces on fingers and underneath the fingernails.  CSW will continue to follow for D/C needs.  Alphonse Guild. Susette Seminara, LCSW, LCAS, CSI Transitions of Care Clinical Social Worker Care Coordination Department Ph: [phone]

## 2019-07-05 NOTE — ED Notes (Signed)
Called report to [facility] on 4W

## 2019-07-05 NOTE — ED Notes (Signed)
Spoke with Diamantina Monks pts mother and obtained telephone consent for surgery tomorrow, second witness Dola Argyle RN.

## 2019-07-05 NOTE — TOC Initial Note (Signed)
Transition of Care [facility]) - Initial/Assessment Note    Patient Details  Name: Brian Stanton MRN: [id number] Date of Birth: 1961-05-06  Transition of Care [facility]) CM/SW Contact:    Claudine Mouton, LCSW Phone Number: 07/05/2019, 10:01 PM  Clinical Narrative:             CSW called pt's mother Brian Stanton at ph: [phone] and it is apparent that pt's mother is very, very hard of hearing.  [name] called her son Brian Stanton and repeatedly asked what was wrong with, "Brian Stanton" and could not seem to understand anything the CSW was saying even though the CSW resorted to yelling into the phone using the, "speaker" mode.  [name] stated she was unaware of a relative being in the hospital.  CSW was finally able to, after much effort, obtain pt's mother's permission to speak to pt's daughter Brian Stanton regarding pt's case.  CSW called pt's daughter Brian Stanton at ph: [phone] who stated she is co-guardian with the pt's mother and stated that she was called by the pt's group home who  Per pt's sister stated her mother was probably without her hearing aides  Per pt's sister, pt has been with [facility] in a facility across from [employer] in what is usually an [place] facility and then went to the facility he is at now.  [name] stated they were suspicious for some time that the facility would not let them see the pt or vica versa and that she feels that the facility heavily sedates pt's and that she has signed papers allowing the facility to utilize interventions to be able to provide dentistry services to the pt.  [name] states the group stated pt has stated pt has dementia and has stated that the pt has been given meds for that.  [name] stated she has no plans if allegations are true and that she hasn't seen the pt since the pt's group home was shut down and that this is something she can't see preventing or finding a solution to due to the pandemic.  [name]  stated she would like APS to follow up and investigate these allegations looked into and that the right, caring individual can keep the pt calm and that if they are not able to do that then this "is a problem"     APS report filed.    APS worker Brian Stanton asked that CSW confirm mother's guardianship status and ask that CSW find out who handles the pt's money and also what the mother's reaction to the allegations and what mother's plan" is as a result of the suspicions/allegations/report.  8:04 PM CSW called pt's sister who is the court-appointed co-legal guardian with the pt's mother, but who states pt's mother is older (87) somewhat confused easily and usually does not have her hearing aids in and should not be called and updated her.  [name] would like the group home to be investigated and the workers interviewed and states that group home has not allowed anyone in or out since the [place] pandemic has started, was seemingly heavily sedating pt's fellow group home members prior to that and that she was told by the group home pt "has dementia and needs dementia meds as a result and also to perform dental treatment" although pt has never officially received a dementia diagnosis.  Per picture provided by the EDP, apparently pt has not received dental [place] either.  8:15 PM CSW spoke again to APS  worker Brian Stanton, updated her and emailed her pictures in a [employer] email of pt's teethwhich present as neglected and with cavities,pt'seyeswith evidence of conjunctivitisand fingers,andfingernailswhichhave been uploadedto show the evidence of desiccated feces on fingers and underneath the fingernails.   Expected Discharge Plan: Skilled Nursing Facility Barriers to Discharge: Continued Medical Work up   Patient Goals and CMS Choice Patient states their goals for this hospitalization and ongoing recovery are:: Per sister who is primary contact and Legal Guardian pt is to get better  and to return to a group home situation      Expected Discharge Plan and Services Expected Discharge Plan: Skilled Nursing Facility                                            Prior Living Arrangements/Services   Lives with:: Facility Resident(Group Home) Patient language and need for interpreter reviewed:: No Do you feel safe going back to the place where you live?: No      Need for Family Participation in Patient Care: Yes (Comment) Care giver support system in place?: No (comment)      Activities of Daily Living      Permission Sought/Granted Permission sought to share information with : Facility Industrial/product designer granted to share information with : No              Emotional Assessment       Orientation: : Fluctuating Orientation (Suspected and/or reported [employer])      Admission diagnosis:  knee pain Patient Active Problem List   Diagnosis Date Noted  . Closed intertrochanteric fracture of hip, left, initial encounter (HCC) 07/05/2019  . Closed displaced fracture of right femoral neck (HCC) 07/05/2019  . Hip fracture (HCC) 07/05/2019  . Hypothyroidism 07/05/2019  . Closed left hip fracture, initial encounter (HCC) 07/05/2019   PCP:  Default, Provider, MD Pharmacy:  No Pharmacies Listed    Social Determinants of Health (SDOH) Interventions    Readmission Risk Interventions No flowsheet data found.

## 2019-07-05 NOTE — Plan of Care (Signed)
Pt arrived to floor in stable condition though remained confused and combative with staff at times. He did allow staff to placed heart monitor on, get vs, and change into gown. He still pulls at tubes and wires and gets fearful with touch of staff. He will also not allow staff to straighten out his legs that are in a folded position bilaterally.

## 2019-07-05 NOTE — ED Provider Notes (Signed)
I received this patient in signout from Dr. Billy Fischer.  At time of signout, he had been diagnosed with bilateral hip fractures, awaiting CT of head through pelvis to rule out any other acute injuries.  CTs were reassuring for acute injury.  He has an area of lungs that appears infectious or inflammatory.  He has had no hypoxia here therefore held off on any antibiotics.  Discussed admission with [facility] hospitalist, Dr. Hal Hope, and pt admitted for further care. Dr. Lorin Mercy, orthopedics, has been consulted and is involved in patient's care.   Yoshito Gaza, Wenda Overland, MD 07/05/19 2118

## 2019-07-05 NOTE — Consult Note (Signed)
Reason for Consult:left closed IT hip Fx, right femoral neck Fx Referring Physician: Rachel Little MD  Brian Stanton is an 58 y.o. male.  HPI: 58-year-old 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, dementia lives in a group home [facility] in a been in bed unable to walk had a bowel movement in the bed.  He was brought and were x-rays demonstrated bilateral hip fractures with left intertrochanteric hip fracture that looks acute and right femoral neck fracture which is likely subacute.  Patient does not talk his mother has power of attorney.  Patient had been in the ER in June with GERD not able to eat or drink for 3 days and admit laying in bed constantly which may be appropriate timing for his right femoral neck fracture possibly.  Past Medical History:  Diagnosis Date  . Down's syndrome   . GERD (gastroesophageal reflux disease)   . Mental retardation     History reviewed. No pertinent surgical history.  No family history on file.  Social History:  reports that he has never smoked. He does not have any smokeless tobacco history on file. He reports that he does not drink alcohol or use drugs.  Allergies: No Known Allergies  Medications: I have reviewed the patient's current medications.  Results for orders placed or performed during the hospital encounter of 07/05/19 (from the past 48 hour(s))  Urinalysis, Routine w reflex microscopic     Status: Abnormal   Collection Time: 07/05/19  1:38 PM  Result Value Ref Range   Color, Urine YELLOW YELLOW   APPearance HAZY (A) CLEAR   Specific Gravity, Urine 1.014 1.005 - 1.030   pH 7.0 5.0 - 8.0   Glucose, UA NEGATIVE NEGATIVE mg/dL   Hgb urine dipstick NEGATIVE NEGATIVE   Bilirubin Urine NEGATIVE NEGATIVE   Ketones, ur NEGATIVE NEGATIVE mg/dL   Protein, ur NEGATIVE NEGATIVE mg/dL   Nitrite NEGATIVE NEGATIVE   Leukocytes,Ua NEGATIVE NEGATIVE    Comment: Performed at [facility], [address] [place], [place] [address]  SARS  Coronavirus 2 (Hospital order, Performed in [place] hospital lab) Nasopharyngeal Urine, Catheterized     Status: None   Collection Time: 07/05/19  1:41 PM   Specimen: Urine, Catheterized; Nasopharyngeal  Result Value Ref Range   SARS Coronavirus 2 NEGATIVE NEGATIVE    Comment: (NOTE) If result is NEGATIVE SARS-CoV-2 target nucleic acids are NOT DETECTED. The SARS-CoV-2 RNA is generally detectable in upper and lower  respiratory specimens during the acute phase of infection. The lowest  concentration of SARS-CoV-2 viral copies this assay can detect is 250  copies / mL. A negative result does not preclude SARS-CoV-2 infection  and should not be used as the sole basis for treatment or other  patient management decisions.  A negative result may occur with  improper specimen collection / handling, submission of specimen other  than nasopharyngeal swab, presence of viral mutation(s) within the  areas targeted by this assay, and inadequate number of viral copies  (<250 copies / mL). A negative result must be combined with clinical  observations, patient history, and epidemiological information. If result is POSITIVE SARS-CoV-2 target nucleic acids are DETECTED. The SARS-CoV-2 RNA is generally detectable in upper and lower  respiratory specimens dur ing the acute phase of infection.  Positive  results are indicative of active infection with SARS-CoV-2.  Clinical  correlation with patient history and other diagnostic information is  necessary to determine patient infection status.  Positive results do  not   rule out bacterial infection or co-infection with other viruses. If result is PRESUMPTIVE POSTIVE SARS-CoV-2 nucleic acids MAY BE PRESENT.   A presumptive positive result was obtained on the submitted specimen  and confirmed on repeat testing.  While 2019 novel coronavirus  (SARS-CoV-2) nucleic acids may be present in the submitted sample  additional confirmatory testing may be  necessary for epidemiological  and / or clinical management purposes  to differentiate between  SARS-CoV-2 and other Sarbecovirus currently known to infect humans.  If clinically indicated additional testing with an alternate test  methodology ([id number]) is advised. The SARS-CoV-2 RNA is generally  detectable in upper and lower respiratory sp ecimens during the acute  phase of infection. The expected result is Negative. Fact Sheet for Patients:  https://www.fda.gov/media/136312/download Fact Sheet for Healthcare Providers: https://www.fda.gov/media/136313/download This test is not yet approved or cleared by the [place] FDA and has been authorized for detection and/or diagnosis of SARS-CoV-2 by FDA under an Emergency Use Authorization (EUA).  This EUA will remain in effect (meaning this test can be used) for the duration of the COVID-19 declaration under Section 564(b)(1) of the Act, 21 U.S.C. section 360bbb-3(b)(1), unless the authorization is terminated or revoked sooner. Performed at [facility], [address] [place], [place] [address]   Lactic acid, plasma     Status: Abnormal   Collection Time: 07/05/19  1:42 PM  Result Value Ref Range   Lactic Acid, Venous 4.3 (HH) 0.5 - 1.9 mmol/L    Comment: CRITICAL RESULT CALLED TO, READ BACK BY AND VERIFIED WITH: A.BULLOCK AT 1710 ON 07/05/19 BY N.THOMPSON Performed at [facility], [address] [place], [place] [address]   TSH     Status: Abnormal   Collection Time: 07/05/19  1:42 PM  Result Value Ref Range   TSH 4.592 (H) 0.350 - 4.500 uIU/mL    Comment: Performed by a 3rd Generation assay with a functional sensitivity of <=0.01 uIU/mL. Performed at [facility], [address] [place], [place] [address]   CBC with Differential     Status: Abnormal   Collection Time: 07/05/19  1:53 PM  Result Value Ref Range   WBC 13.8 (H) 4.0 - 10.5 K/uL   RBC 4.43 4.22 - 5.81  MIL/uL   Hemoglobin 15.2 13.0 - 17.0 g/dL   HCT 46.0 39.0 - 52.0 %   MCV 103.8 (H) 80.0 - 100.0 fL   MCH 34.3 (H) 26.0 - 34.0 pg   MCHC 33.0 30.0 - 36.0 g/dL   RDW 13.9 11.5 - 15.5 %   Platelets 270 150 - 400 K/uL   nRBC 0.0 0.0 - 0.2 %   Neutrophils Relative % 90 %   Neutro Abs 12.5 (H) 1.7 - 7.7 K/uL   Lymphocytes Relative 4 %   Lymphs Abs 0.5 (L) 0.7 - 4.0 K/uL   Monocytes Relative 5 %   Monocytes Absolute 0.7 0.1 - 1.0 K/uL   Eosinophils Relative 0 %   Eosinophils Absolute 0.0 0.0 - 0.5 K/uL   Basophils Relative 0 %   Basophils Absolute 0.1 0.0 - 0.1 K/uL   Immature Granulocytes 1 %   Abs Immature Granulocytes 0.07 0.00 - 0.07 K/uL    Comment: Performed at [facility], [address] [place], [place] [address]  Comprehensive metabolic panel     Status: Abnormal   Collection Time: 07/05/19  1:53 PM  Result Value Ref Range   Sodium 140 135 - 145 mmol/L     Potassium 3.6 3.5 - 5.1 mmol/L   Chloride 103 98 - 111 mmol/L   CO2 25 22 - 32 mmol/L   Glucose, Bld 146 (H) 70 - 99 mg/dL   BUN 22 (H) 6 - 20 mg/dL   Creatinine, Ser 1.18 0.61 - 1.24 mg/dL   Calcium 8.9 8.9 - 10.3 mg/dL   Total Protein 7.7 6.5 - 8.1 g/dL   Albumin 3.6 3.5 - 5.0 g/dL   AST 45 (H) 15 - 41 U/L   ALT 26 0 - 44 U/L   Alkaline Phosphatase 62 38 - 126 U/L   Total Bilirubin 1.0 0.3 - 1.2 mg/dL   GFR calc non Af Amer >60 >60 mL/min   GFR calc Af Amer >60 >60 mL/min   Anion gap 12 5 - 15    Comment: Performed at [facility], [address] [place], [place] [address]  Troponin I (High Sensitivity)     Status: None   Collection Time: 07/05/19  1:53 PM  Result Value Ref Range   Troponin I (High Sensitivity) 17 <18 ng/L    Comment: (NOTE) Elevated high sensitivity troponin I (hsTnI) values and significant  changes across serial measurements may suggest ACS but many other  chronic and acute conditions are known to elevate hsTnI results.  Refer to the "Links" section for  chest pain algorithms and additional  guidance. Performed at [facility], [address] [place], [place] [address]   CK     Status: Abnormal   Collection Time: 07/05/19  1:53 PM  Result Value Ref Range   Total CK 791 (H) 49 - 397 U/L    Comment: Performed at [facility], [address] , [place] [address]    Dg Chest 1 View  Result Date: 07/05/2019 CLINICAL DATA:  58-year-old male with dementia and altered level of consciousness/activity level today. EXAM: CHEST  1 VIEW COMPARISON:  Prior chest x-ray 07/19/2009 FINDINGS: The lungs are clear and negative for focal airspace consolidation, pulmonary edema or suspicious pulmonary nodule. No pleural effusion or pneumothorax. Cardiac and mediastinal contours are within normal limits. Atherosclerotic calcifications visualized along the aorta. No acute fracture or lytic or blastic osseous lesions. The visualized upper abdominal bowel gas pattern is unremarkable. IMPRESSION: No active disease. Electronically Signed   By: Heath  McCullough M.D.   On: 07/05/2019 15:28   Dg Knee 2 Views Left  Result Date: 07/05/2019 CLINICAL DATA:  Pain and redness. EXAM: LEFT KNEE - 1-2 VIEW COMPARISON:  No recent. FINDINGS: No acute bony or joint abnormality identified. No evidence of fracture dislocation. IMPRESSION: No acute abnormality. Electronically Signed   By: [name]  Register   On: 07/05/2019 13:19   Ct Abdomen Pelvis W Contrast  Result Date: 07/05/2019 CLINICAL DATA:  had a BM in bed. According to nursing home personnel he usually walks and moves around as he wants to but today is not wanting to walk or stand. He is guarding his knee (side is questionable). No reported mechanism of injury, fall, or trauma. Patient has a history of down syndrome and is non verbal and combative. EXAM: CT ABDOMEN AND PELVIS WITH CONTRAST TECHNIQUE: Multidetector CT imaging of the abdomen and pelvis was performed using the standard  protocol following bolus administration of intravenous contrast. CONTRAST:  100mL OMNIPAQUE IOHEXOL 300 MG/ML  SOLN COMPARISON:  01/16/2012 FINDINGS: Lower chest: No pleural or pericardial effusion. Patchy airspace opacities in the medial basal segment right lower lobe, new since previous.   Hepatobiliary: No focal liver abnormality is seen. No gallstones, gallbladder wall thickening, or biliary dilatation. Pancreas: Unremarkable. No pancreatic ductal dilatation or surrounding inflammatory changes. Spleen: Normal in size. Scattered punctate calcified granulomas. Adrenals/Urinary Tract: Normal adrenals. Right kidney unremarkable. Chronic left UPJ obstruction. No renal mass. Urinary bladder incompletely distended, thick-walled. Stomach/Bowel: Small hiatal hernia. Stomach is nondilated. Small bowel decompressed. Normal appendix. The colon is nondilated. Scattered sigmoid diverticula without significant adjacent inflammatory/edematous change or abscess. Moderate rectal fecal material. Vascular/Lymphatic: No significant vascular findings are present. No enlarged abdominal or pelvic lymph nodes. Reproductive: Prostate normal in size with central coarse calcifications. Other: No ascites. No free air. Musculoskeletal: Comminuted left intertrochanteric femur fracture with mild varus deformity. Regional soft tissue swelling. Chronic appearing subcapital or midcervical right femur fracture, new since 01/16/2012, with resorption along the fracture lines. Bony pelvis intact. Degenerative disc disease L1-2. 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 IMPRESSION: 1. Comminuted left intertrochanteric femur fracture with mild varus deformity. 2. 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 3. Patchy airspace opacities in the medial basal segment right lower lobe, new since previous study, possibly infectious/inflammatory. 4.  Sigmoid diverticulosis. 5. Small hiatal hernia. 6. Chronic  left UPJ obstruction. 7. Chronic diffuse thickening of the wall of the urinary bladder. Electronically Signed   By: D  Hassell M.D.   On: 07/05/2019 18:31   Dg Knee Complete 4 Views Right  Result Date: 07/05/2019 CLINICAL DATA:  Right knee pain EXAM: RIGHT KNEE - COMPLETE 4+ VIEW COMPARISON:  None. FINDINGS: No evidence of fracture, dislocation, or joint effusion. No evidence of arthropathy or other focal bone abnormality. Soft tissues are unremarkable. IMPRESSION: Negative. Electronically Signed   By: Hetal  Patel   On: 07/05/2019 15:24   Dg Hips Bilat W Or Wo Pelvis 3-4 Views  Result Date: 07/05/2019 CLINICAL DATA:  Patient unable to walk today. EXAM: DG HIP (WITH OR WITHOUT PELVIS) 3-4V BILAT COMPARISON:  None. FINDINGS: Displaced right subcapital femoral neck fracture. Slight sclerosis along the fracture cleft which may reflect a subacute injury. Comminuted left intertrochanteric fracture with apex lateral angulation. No other acute fracture or dislocation. No aggressive osseous lesion. IMPRESSION: Displaced right subcapital femoral neck fracture. Slight sclerosis along the fracture cleft which may reflect a subacute injury. Comminuted left intertrochanteric fracture with apex lateral angulation. Electronically Signed   By: Hetal  Patel   On: 07/05/2019 15:26    ROS review of systems unobtainable due to patient's dementia, Down syndrome and nonverbal.  Patient likes to look at books per chart.  His mother relates that he will verbalize to her but not others that he does not know. Blood pressure 109/68, pulse 89, temperature 99.5 F (37.5 C), temperature source Rectal, resp. rate 18, SpO2 97 %. Physical Exam  HENT:  Head: Atraumatic.  Eyes: EOM are normal.  Neck: No tracheal deviation present. No thyromegaly present.  Cardiovascular: Normal rate.  Respiratory: Effort normal.  Musculoskeletal:     Comments: Both hips shortened and maximal external rotation with his ankles crossed.   Neurological:  Patient nonverbal.  Skin: Skin is warm.  Scrotum and buttocks covered with stool.  Poor hygiene.    Assessment/Plan: Patient with bilateral hip fractures.  Left intertrochanteric hip fracture is comminuted and appears acute.  Opposite right hip femoral neck appears chronic and may be weeks versus months old.  Discussed with patient's mother by telephone outline plan for treatment with trochanteric nail fixation of his comminuted intertrochanteric hip fracture   on the left.  P stable for the first after the procedure then we will proceed with hemiarthroplasty for his opposite right hip which is more chronic and there is less swelling around the right hip.  Surgery scheduled at around 5:30 PM on Wednesday. My cell [phone]  Everlene Cunning C Chibuikem Thang 07/05/2019, 6:35 PM     

## 2019-07-05 NOTE — ED Notes (Addendum)
ED TO INPATIENT HANDOFF REPORT  ED Nurse Name and Phone #:  Shirl Harris [phone]  S Name/Age/Gender Brian Stanton 58 y.o. male Room/Bed: WA17/WA17  Code Status   Code Status: Not on file  Home/SNF/Other Boarding Home Patient oriented to: self Is this baseline? Yes   Triage Complete: Triage complete  Chief Complaint knee pain  Triage Note Pt BIBA from group home. Pt is refusing to walk this morning, which may be knee pain. Pt combative and refused vitals with EMS.   Pt also refused vital signs with staff here.   Allergies No Known Allergies  Level of Care/Admitting Diagnosis ED Disposition    ED Disposition Condition Comment   Admit  Hospital Area: [facility] [[id number]]  Level of Care: Telemetry [5]  Admit to tele based on following criteria: Monitor for Ischemic changes  Covid Evaluation: N/A  Diagnosis: Closed left hip fracture, initial encounter [facility]) [[id number]]  Admitting Physician: Eduard Clos [phone]  Attending Physician: Eduard Clos [phone]  Estimated length of stay: past midnight tomorrow  Certification:: I certify this patient will need inpatient services for at least 2 midnights  PT Class (Do Not Modify): Inpatient [101]  PT Acc Code (Do Not Modify): Private [1]       B Medical/Surgery History Past Medical History:  Diagnosis Date  . Down's syndrome   . GERD (gastroesophageal reflux disease)   . Mental retardation    History reviewed. No pertinent surgical history.   A IV Location/Drains/Wounds Patient Lines/Drains/Airways Status   Active Line/Drains/Airways    Name:   Placement date:   Placement time:   Site:   Days:   Peripheral IV 07/05/19 Right;Distal Forearm   07/05/19    1627    Forearm   less than 1   Peripheral IV 07/05/19 Right Antecubital   07/05/19    2120    Antecubital   less than 1          Intake/Output Last 24 hours No intake or output data in the 24 hours ending 07/05/19  2138  Labs/Imaging Results for orders placed or performed during the hospital encounter of 07/05/19 (from the past 48 hour(s))  Urinalysis, Routine w reflex microscopic     Status: Abnormal   Collection Time: 07/05/19  1:38 PM  Result Value Ref Range   Color, Urine YELLOW YELLOW   APPearance HAZY (A) CLEAR   Specific Gravity, Urine 1.014 1.005 - 1.030   pH 7.0 5.0 - 8.0   Glucose, UA NEGATIVE NEGATIVE mg/dL   Hgb urine dipstick NEGATIVE NEGATIVE   Bilirubin Urine NEGATIVE NEGATIVE   Ketones, ur NEGATIVE NEGATIVE mg/dL   Protein, ur NEGATIVE NEGATIVE mg/dL   Nitrite NEGATIVE NEGATIVE   Leukocytes,Ua NEGATIVE NEGATIVE    Comment: Performed at [facility], [address] [place], [place] [address]  SARS Coronavirus 2 [facility] order, Performed in [facility] hospital lab) Nasopharyngeal Urine, Catheterized     Status: None   Collection Time: 07/05/19  1:41 PM   Specimen: Urine, Catheterized; Nasopharyngeal  Result Value Ref Range   SARS Coronavirus 2 NEGATIVE NEGATIVE    Comment: (NOTE) If result is NEGATIVE SARS-CoV-2 target nucleic acids are NOT DETECTED. The SARS-CoV-2 RNA is generally detectable in upper and lower  respiratory specimens during the acute phase of infection. The lowest  concentration of SARS-CoV-2 viral copies this assay can detect is 250  copies / mL. A negative result does not preclude SARS-CoV-2 infection  and should not  be used as the sole basis for treatment or other  patient management decisions.  A negative result may occur with  improper specimen collection / handling, submission of specimen other  than nasopharyngeal swab, presence of viral mutation(s) within the  areas targeted by this assay, and inadequate number of viral copies  (<250 copies / mL). A negative result must be combined with clinical  observations, patient history, and epidemiological information. If result is POSITIVE SARS-CoV-2 target nucleic acids are  DETECTED. The SARS-CoV-2 RNA is generally detectable in upper and lower  respiratory specimens dur ing the acute phase of infection.  Positive  results are indicative of active infection with SARS-CoV-2.  Clinical  correlation with patient history and other diagnostic information is  necessary to determine patient infection status.  Positive results do  not rule out bacterial infection or co-infection with other viruses. If result is PRESUMPTIVE POSTIVE SARS-CoV-2 nucleic acids MAY BE PRESENT.   A presumptive positive result was obtained on the submitted specimen  and confirmed on repeat testing.  While 2019 novel coronavirus  (SARS-CoV-2) nucleic acids may be present in the submitted sample  additional confirmatory testing may be necessary for epidemiological  and / or clinical management purposes  to differentiate between  SARS-CoV-2 and other Sarbecovirus currently known to infect humans.  If clinically indicated additional testing with an alternate test  methodology [phone]) is advised. The SARS-CoV-2 RNA is generally  detectable in upper and lower respiratory sp ecimens during the acute  phase of infection. The expected result is Negative. Fact Sheet for Patients:  BoilerBrush.com.cy Fact Sheet for Healthcare Providers: https://pope.com/ This test is not yet approved or cleared by the [place] FDA and has been authorized for detection and/or diagnosis of SARS-CoV-2 by FDA under an Emergency Use Authorization (EUA).  This EUA will remain in effect (meaning this test can be used) for the duration of the COVID-19 declaration under Section 564(b)(1) of the Act, 21 U.S.C. section 360bbb-3(b)(1), unless the authorization is terminated or revoked sooner. Performed at [facility] [place], [address] [place], [place] [address]   Lactic acid, plasma     Status: Abnormal   Collection Time: 07/05/19  1:42 PM  Result  Value Ref Range   Lactic Acid, Venous 4.3 (HH) 0.5 - 1.9 mmol/L    Comment: CRITICAL RESULT CALLED TO, READ BACK BY AND VERIFIED WITH: A.BULLOCK AT 1710 ON 07/05/19 BY N.THOMPSON Performed at [facility], [address] [place], [place] [address]   TSH     Status: Abnormal   Collection Time: 07/05/19  1:42 PM  Result Value Ref Range   TSH 4.592 (H) 0.350 - 4.500 uIU/mL    Comment: Performed by a 3rd Generation assay with a functional sensitivity of <=0.01 uIU/mL. Performed at [facility], [address], [place], [place] [address]   T4, free     Status: None   Collection Time: 07/05/19  1:42 PM  Result Value Ref Range   Free T4 1.10 0.61 - 1.12 ng/dL    Comment: (NOTE) Biotin ingestion may interfere with free T4 tests. If the results are inconsistent with the TSH level, previous test results, or the clinical presentation, then consider biotin interference. If needed, order repeat testing after stopping biotin. Performed at [facility], [address] [place], [place] [address]   CBC with Differential     Status: Abnormal   Collection Time: 07/05/19  1:53 PM  Result Value Ref Range  WBC 13.8 (H) 4.0 - 10.5 K/uL   RBC 4.43 4.22 - 5.81 MIL/uL   Hemoglobin 15.2 13.0 - 17.0 g/dL   HCT 24.5 80.9 - 98.3 %   MCV 103.8 (H) 80.0 - 100.0 fL   MCH 34.3 (H) 26.0 - 34.0 pg   MCHC 33.0 30.0 - 36.0 g/dL   RDW 38.2 50.5 - 39.7 %   Platelets 270 150 - 400 K/uL   nRBC 0.0 0.0 - 0.2 %   Neutrophils Relative % 90 %   Neutro Abs 12.5 (H) 1.7 - 7.7 K/uL   Lymphocytes Relative 4 %   Lymphs Abs 0.5 (L) 0.7 - 4.0 K/uL   Monocytes Relative 5 %   Monocytes Absolute 0.7 0.1 - 1.0 K/uL   Eosinophils Relative 0 %   Eosinophils Absolute 0.0 0.0 - 0.5 K/uL   Basophils Relative 0 %   Basophils Absolute 0.1 0.0 - 0.1 K/uL   Immature Granulocytes 1 %   Abs Immature Granulocytes 0.07 0.00 - 0.07 K/uL    Comment: Performed at [facility], [address], [place], [place] [address]  Comprehensive metabolic panel     Status: Abnormal   Collection Time: 07/05/19  1:53 PM  Result Value Ref Range   Sodium 140 135 - 145 mmol/L   Potassium 3.6 3.5 - 5.1 mmol/L   Chloride 103 98 - 111 mmol/L   CO2 25 22 - 32 mmol/L   Glucose, Bld 146 (H) 70 - 99 mg/dL   BUN 22 (H) 6 - 20 mg/dL   Creatinine, Ser 9.37 0.61 - 1.24 mg/dL   Calcium 8.9 8.9 - 90.2 mg/dL   Total Protein 7.7 6.5 - 8.1 g/dL   Albumin 3.6 3.5 - 5.0 g/dL   AST 45 (H) 15 - 41 U/L   ALT 26 0 - 44 U/L   Alkaline Phosphatase 62 38 - 126 U/L   Total Bilirubin 1.0 0.3 - 1.2 mg/dL   GFR calc non Af Amer >60 >60 mL/min   GFR calc Af Amer >60 >60 mL/min   Anion gap 12 5 - 15    Comment: Performed at [facility], [address] [place], [place] [address]  Troponin I (High Sensitivity)     Status: None   Collection Time: 07/05/19  1:53 PM  Result Value Ref Range   Troponin I (High Sensitivity) 17 <18 ng/L    Comment: (NOTE) Elevated high sensitivity troponin I (hsTnI) values and significant  changes across serial measurements may suggest ACS but many other  chronic and acute conditions are known to elevate hsTnI results.  Refer to the "Links" section for chest pain algorithms and additional  guidance. Performed at [facility], [address] [place], [place] [address]   CK     Status: Abnormal   Collection Time: 07/05/19  1:53 PM  Result Value Ref Range   Total CK 791 (H) 49 - 397 U/L    Comment: Performed at [facility], [address] [place], [place] [address]  CBG monitoring, ED     Status: Abnormal   Collection Time: 07/05/19  9:33 PM  Result Value Ref Range   Glucose-Capillary 119 (H) 70 - 99 mg/dL   Dg Chest 1 View  Result Date: 07/05/2019 CLINICAL DATA:  58 year old male with dementia and altered level of consciousness/activity level today. EXAM: CHEST  1 VIEW COMPARISON:  Prior chest  x-ray 07/19/2009 FINDINGS: The lungs are clear and negative  for focal airspace consolidation, pulmonary edema or suspicious pulmonary nodule. No pleural effusion or pneumothorax. Cardiac and mediastinal contours are within normal limits. Atherosclerotic calcifications visualized along the aorta. No acute fracture or lytic or blastic osseous lesions. The visualized upper abdominal bowel gas pattern is unremarkable. IMPRESSION: No active disease. Electronically Signed   By: Malachy MoanHeath  McCullough M.D.   On: 07/05/2019 15:28   Dg Knee 2 Views Left  Result Date: 07/05/2019 CLINICAL DATA:  Pain and redness. EXAM: LEFT KNEE - 1-2 VIEW COMPARISON:  No recent. FINDINGS: No acute bony or joint abnormality identified. No evidence of fracture dislocation. IMPRESSION: No acute abnormality. Electronically Signed   By: Maisie Fushomas  Register   On: 07/05/2019 13:19   Ct Head Wo Contrast  Result Date: 07/05/2019 CLINICAL DATA:  Altered mental status.  No known injury EXAM: CT HEAD WITHOUT CONTRAST CT CERVICAL SPINE WITHOUT CONTRAST TECHNIQUE: Multidetector CT imaging of the head and cervical spine was performed following the standard protocol without intravenous contrast. Multiplanar CT image reconstructions of the cervical spine were also generated. COMPARISON:  None. FINDINGS: CT HEAD FINDINGS Brain: Motion degraded study.  Multiple repeats Moderate atrophy without hydrocephalus. No acute infarct. Negative for acute hemorrhage or mass. Benign-appearing calcification basal ganglia bilaterally. Vascular: Negative for hyperdense vessel Skull: Negative Sinuses/Orbits: Mild mucosal edema left maxillary sinus likely odontogenic with periapical abscess left upper teeth. Other: None CT CERVICAL SPINE FINDINGS Alignment: 2 mm anterolisthesis C3-4 and C7-T1. Mild retrolisthesis C4-5 and C5-6. Mild anterolisthesis C6-7. Skull base and vertebrae: Negative for cervical spine fracture Soft tissues and spinal canal: Fat containing mass in the  subcutaneous tissues of the posterior neck measures approximately 6.6 x 3.1 x 8.3 cm. There is stranding within the fatty mass as well as stranding in the surrounding tissues. Disc levels: Multilevel disc and facet degeneration throughout the cervical spine. Multilevel foraminal stenosis due to spurring. Upper chest: Negative Other: Motion degraded study.  Multiple repeat images. IMPRESSION: 1. Motion degraded CT head and cervical spine 2. No acute intracranial abnormality.  Moderate atrophy. 3. Cervical spondylosis.  Negative for fracture. 4. Large fat containing mass in the subcutaneous tissues posterior neck. This may represent a lipoma or liposarcoma. Physical examination and close follow-up recommended. Electronically Signed   By: Marlan Palauharles  Clark M.D.   On: 07/05/2019 18:44   Ct Cervical Spine Wo Contrast  Result Date: 07/05/2019 CLINICAL DATA:  Altered mental status.  No known injury EXAM: CT HEAD WITHOUT CONTRAST CT CERVICAL SPINE WITHOUT CONTRAST TECHNIQUE: Multidetector CT imaging of the head and cervical spine was performed following the standard protocol without intravenous contrast. Multiplanar CT image reconstructions of the cervical spine were also generated. COMPARISON:  None. FINDINGS: CT HEAD FINDINGS Brain: Motion degraded study.  Multiple repeats Moderate atrophy without hydrocephalus. No acute infarct. Negative for acute hemorrhage or mass. Benign-appearing calcification basal ganglia bilaterally. Vascular: Negative for hyperdense vessel Skull: Negative Sinuses/Orbits: Mild mucosal edema left maxillary sinus likely odontogenic with periapical abscess left upper teeth. Other: None CT CERVICAL SPINE FINDINGS Alignment: 2 mm anterolisthesis C3-4 and C7-T1. Mild retrolisthesis C4-5 and C5-6. Mild anterolisthesis C6-7. Skull base and vertebrae: Negative for cervical spine fracture Soft tissues and spinal canal: Fat containing mass in the subcutaneous tissues of the posterior neck measures  approximately 6.6 x 3.1 x 8.3 cm. There is stranding within the fatty mass as well as stranding in the surrounding tissues. Disc levels: Multilevel disc and facet degeneration throughout the cervical spine. Multilevel foraminal stenosis due to spurring. Upper chest:  Negative Other: Motion degraded study.  Multiple repeat images. IMPRESSION: 1. Motion degraded CT head and cervical spine 2. No acute intracranial abnormality.  Moderate atrophy. 3. Cervical spondylosis.  Negative for fracture. 4. Large fat containing mass in the subcutaneous tissues posterior neck. This may represent a lipoma or liposarcoma. Physical examination and close follow-up recommended. Electronically Signed   By: Franchot Gallo M.D.   On: 07/05/2019 18:44   Ct Abdomen Pelvis W Contrast  Result Date: 07/05/2019 CLINICAL DATA:  had a BM in bed. According to nursing home personnel he usually walks and moves around as he wants to but today is not wanting to walk or stand. He is guarding his knee (side is questionable). No reported mechanism of injury, fall, or trauma. Patient has a history of down syndrome and is non verbal and combative. EXAM: CT ABDOMEN AND PELVIS WITH CONTRAST TECHNIQUE: Multidetector CT imaging of the abdomen and pelvis was performed using the standard protocol following bolus administration of intravenous contrast. CONTRAST:  135mL OMNIPAQUE IOHEXOL 300 MG/ML  SOLN COMPARISON:  01/16/2012 FINDINGS: Lower chest: No pleural or pericardial effusion. Patchy airspace opacities in the medial basal segment right lower lobe, new since previous. Hepatobiliary: No focal liver abnormality is seen. No gallstones, gallbladder wall thickening, or biliary dilatation. Pancreas: Unremarkable. No pancreatic ductal dilatation or surrounding inflammatory changes. Spleen: Normal in size. Scattered punctate calcified granulomas. Adrenals/Urinary Tract: Normal adrenals. Right kidney unremarkable. Chronic left UPJ obstruction. No renal mass.  Urinary bladder incompletely distended, thick-walled. Stomach/Bowel: Small hiatal hernia. Stomach is nondilated. Small bowel decompressed. Normal appendix. The colon is nondilated. Scattered sigmoid diverticula without significant adjacent inflammatory/edematous change or abscess. Moderate rectal fecal material. Vascular/Lymphatic: No significant vascular findings are present. No enlarged abdominal or pelvic lymph nodes. Reproductive: Prostate normal in size with central coarse calcifications. Other: No ascites. No free air. Musculoskeletal: Comminuted left intertrochanteric femur fracture with mild varus deformity. Regional soft tissue swelling. Chronic appearing subcapital or midcervical right femur fracture, new since 01/16/2012, with resorption along the fracture lines. Bony pelvis intact. Degenerative disc disease L1-2. 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 IMPRESSION: 1. Comminuted left intertrochanteric femur fracture with mild varus deformity. 2. 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 3. Patchy airspace opacities in the medial basal segment right lower lobe, new since previous study, possibly infectious/inflammatory. 4.  Sigmoid diverticulosis. 5. Small hiatal hernia. 6. Chronic left UPJ obstruction. 7. Chronic diffuse thickening of the wall of the urinary bladder. Electronically Signed   By: Lucrezia Europe M.D.   On: 07/05/2019 18:31   Dg Knee Complete 4 Views Right  Result Date: 07/05/2019 CLINICAL DATA:  Right knee pain EXAM: RIGHT KNEE - COMPLETE 4+ VIEW COMPARISON:  None. FINDINGS: No evidence of fracture, dislocation, or joint effusion. No evidence of arthropathy or other focal bone abnormality. Soft tissues are unremarkable. IMPRESSION: Negative. Electronically Signed   By: Kathreen Devoid   On: 07/05/2019 15:24   Dg Hips Bilat W Or Wo Pelvis 3-4 Views  Result Date: 07/05/2019 CLINICAL DATA:  Patient unable to walk today. EXAM: DG HIP  (WITH OR WITHOUT PELVIS) 3-4V BILAT COMPARISON:  None. FINDINGS: Displaced right subcapital femoral neck fracture. Slight sclerosis along the fracture cleft which may reflect a subacute injury. Comminuted left intertrochanteric fracture with apex lateral angulation. No other acute fracture or dislocation. No aggressive osseous lesion. IMPRESSION: Displaced right subcapital femoral neck fracture. Slight sclerosis along the fracture cleft which may reflect a subacute injury.  Comminuted left intertrochanteric fracture with apex lateral angulation. Electronically Signed   By: Elige Ko   On: 07/05/2019 15:26    Pending Labs Unresulted Labs (From admission, onward)    Start     Ordered   07/05/19 2101  HIV antibody (Routine Screening)  Once,   STAT     07/05/19 2100   07/05/19 2100  Culture, blood (routine x 2)  BLOOD CULTURE X 2,   R (with STAT occurrences)     07/05/19 2059   07/05/19 2100  Procalcitonin - Baseline  ONCE - STAT,   STAT     07/05/19 2059   07/05/19 2100  Lactic acid, plasma  STAT Now then every 3 hours,   R (with STAT occurrences)     07/05/19 2059   07/05/19 1342  Lactic acid, plasma  Now then every 2 hours,   STAT     07/05/19 1342   07/05/19 1338  Urine culture  ONCE - STAT,   STAT     07/05/19 1342   Signed and Held  CBC  Tomorrow morning,   R     Signed and Held   Signed and Held  Basic metabolic panel  Tomorrow morning,   R     Signed and Held          Vitals/Pain Today's Vitals   07/05/19 1610 07/05/19 1900 07/05/19 1934 07/05/19 2022  BP: 109/68   140/72  Pulse: 89   84  Resp: 18   20  Temp: 99.5 F (37.5 C)     TempSrc: Rectal     SpO2: 97%   98%  PainSc:  Asleep Asleep     Isolation Precautions No active isolations  Medications Medications  haloperidol lactate (HALDOL) injection 5 mg (5 mg Intramuscular Given 07/05/19 2119)  LORazepam (ATIVAN) injection 2 mg (0 mg Intravenous Hold 07/05/19 1631)  sodium chloride (PF) 0.9 % injection (0 mLs  Hold  07/05/19 1858)  cefTRIAXone (ROCEPHIN) 2 g in sodium chloride 0.9 % 100 mL IVPB (2 g Intravenous New Bag/Given 07/05/19 2136)  azithromycin (ZITHROMAX) 500 mg in sodium chloride 0.9 % 250 mL IVPB (has no administration in time range)  traZODone (DESYREL) tablet 25 mg (has no administration in time range)  lubiprostone (AMITIZA) capsule 24 mcg (has no administration in time range)  Melatonin TABS 3 mg (has no administration in time range)  morphine 2 MG/ML injection 0.5 mg (has no administration in time range)  morphine 4 MG/ML injection 4 mg (4 mg Intravenous Given 07/05/19 1625)  iohexol (OMNIPAQUE) 300 MG/ML solution 100 mL (100 mLs Intravenous Contrast Given 07/05/19 1658)    Mobility non-ambulatory High fall risk    R Recommendations: See Admitting Provider Note  Report given to: Lupita Leash

## 2019-07-05 NOTE — Progress Notes (Addendum)
CSW received a call from pt's RN stating pictures of the pt of the pt's teeth which present as neglected and with cavities, pt's eyes with evidence of conjunctivitis and fingers,  And fingernails which have been uploaded to show the evidence of desiccated feces on fingers and underneath the fingernails to pt's chart.  Per pt's RN,  there is also confirmation of bi-lateral fractured hips of which there is no good explanation for.   CSW also spoke to Dr. Rex Kras at ph: [phone]   Per EPD, one of the hip fractures is old, with evidence that pt has been "walking on it" for quite some time and one of which is new and acute which is of itself concerning due to two possible separate incidents.   Per EPD, more imaging is underway to assess whether there is more to be seen with the hip fractures and to seek out and assess any more injuries previously unseen or assessed.  Per notes pt is from [place] at:  [facility] [id number]  Per online review pt's group home is AKA:   [facility]  There is no known legal guardian listed under FYI's in the chart.  CSW will continue to follow for D/C needs.  Alphonse Guild. Zehra Rucci, LCSW, LCAS, CSI Transitions of Care Clinical Social Worker Care Coordination Department Ph: [phone]

## 2019-07-05 NOTE — ED Provider Notes (Signed)
[facility]-EMERGENCY DEPT Provider Note   CSN: [id number] Arrival date & time: 07/05/19  1050   History   Chief Complaint Chief Complaint  Patient presents with  . Knee Pain    HPI Brian Stanton is a 58 y.o. male with history of Down Syndrome and Dementia presenting from his group home ([facility]) this morning. He was found in his bed where he had had a BM in bed. According to nursing home personnel he usually walks and moves around as he wants to but today is not wanting to walk or stand. He is guarding his knee (side is questionable).  No reported mechanism of injury, fall, or trauma.  No other associated symptoms such as fevers, shortness of breath, cough, vomiting, stool changes, or rashes.  The facility wanted him to be evaluated for pain and have Xrays done.  Nurse on call #[phone]   Past Medical History:  Diagnosis Date  . Down's syndrome   . GERD (gastroesophageal reflux disease)   . Mental retardation     There are no active problems to display for this patient.  History reviewed. No pertinent surgical history.    Home Medications    Prior to Admission medications   Medication Sig Start Date End Date Taking? Authorizing Provider  cholecalciferol (VITAMIN D3) 25 MCG (1000 UT) tablet Take 1,000 Units by mouth daily.    [provider]  donepezil (ARICEPT) 5 MG tablet Take 5 mg by mouth daily.    [provider]  hydrocortisone (ANUSOL-HC) 25 MG suppository Place 25 mg rectally 3 (three) times daily as needed for hemorrhoids.     [provider]  Infant Care Products (BABY SHAMPOO EX) Apply 1 application topically daily. Use to wash eyes and face daily    [provider]  levothyroxine (SYNTHROID) 88 MCG tablet Take 88 mcg by mouth daily before breakfast.    [provider]  lubiprostone (AMITIZA) 24 MCG capsule Take 24 mcg by mouth 2 (two) times a day.    [provider]  memantine  (NAMENDA XR) 28 MG CP24 24 hr capsule Take 28 mg by mouth daily.    [provider]  mirabegron ER (MYRBETRIQ) 25 MG TB24 tablet Take 25 mg by mouth daily. At 4 pm    [provider]  Probiotic Product (ALIGN PO) Take 1 capsule by mouth every evening.    [provider]  promethazine (PHENERGAN) 25 MG suppository Place 25 mg rectally every 4 (four) hours as needed for nausea or vomiting.    [provider]  Sunscreens (BLISTEX LIP BALM EX) Apply 1 application topically 3 (three) times daily.    [provider]   Family History No family history on file.  Social History Social History   Tobacco Use  . Smoking status: Never Smoker  Substance Use Topics  . Alcohol use: No  . Drug use: No    Allergies   Patient has no known allergies.   Review of Systems Review of Systems - see HPI   Physical Exam Updated Vital Signs BP 120/82 (BP Location: Left Arm)   Pulse 83   Temp 98 F (36.7 C) (Axillary)   Resp 17   SpO2 95%   Physical Exam Constitutional:      Appearance: He is not ill-appearing.  Neck:     Musculoskeletal: Normal range of motion.  Cardiovascular:     Rate and Rhythm: Normal rate and regular rhythm.  Pulses: Normal pulses.     Heart sounds: Normal heart sounds. No murmur.  Pulmonary:     Effort: Pulmonary effort is normal. No respiratory distress.     Breath sounds: Normal breath sounds.  Abdominal:     Palpations: Abdomen is soft.  Musculoskeletal:        General: Tenderness (TTP everywhere, nonspecific) present. No swelling, deformity or signs of injury.     Right lower leg: No edema.     Left lower leg: No edema.  Lymphadenopathy:     Cervical: No cervical adenopathy.  Skin:    General: Skin is warm and dry.     Capillary Refill: Capillary refill takes less than 2 seconds.     Findings: No bruising, lesion or rash.  Neurological:     General: No focal deficit present.     Mental Status: He is alert.      Comments: Patient nonverbal with dementia; unable to assess strength, sensation, and coordination; patient refusing to walk today    ED Treatments / Results  Labs (all labs ordered are listed, but only abnormal results are displayed) Labs Reviewed - No data to display  EKG None  Radiology No results found.  Procedures Procedures (including critical care time)  Medications Ordered in ED Medications - No data to display   Initial Impression / Assessment and Plan / ED Course  I have reviewed the triage vital signs and the nursing notes.  Pertinent labs & imaging results that were available during my care of the patient were reviewed by me and considered in my medical decision making (see chart for details).  Difficulty with ambulation: No concerning physical exam findings appreciated including injury or deformity to extremities, no swelling or particular erythema appreciated.  History does not align with mechanism of injury or trauma preventing patient from walking. Unable to assess patient mentation, whether or not he is altered. -Ordered urinalysis with urine culture -COVID swab  -Lactic acid, TSH, T4, CK -CBC, CMP -Troponin -Imaging: CT head, cervical spine, abdomen and pelvis, x-ray chest, hips and bilateral knees  Final Clinical Impressions(s) / ED Diagnoses   Final diagnoses:  None    ED Discharge Orders    None     Milus Banister, [place], PGY-2 07/05/2019 12:36 PM    Daisy Floro, DO 07/05/19 1410    Gareth Morgan, MD 07/07/19 1209

## 2019-07-05 NOTE — H&P (Addendum)
History and Physical    Brian Stanton CWC:[id number] DOB: [birth year] DOA: 07/05/2019  PCP: Default, Provider, MD  Patient coming from: Group home.  Chief Complaint: Knee pain.  History obtained from ER physician patient's mother previous records.  Patient cannot provide history as patient has his intellectual disability Down syndrome and dementia.  HPI: Brian Stanton is a 58 y.o. male with history of Down syndrome, intellectual disability, dementia, hypothyroidism who as per the report he is usually ambulatory was found to be not wanting to walk this morning and was complaining of knee pain.  The exact site of the right knee pain was not sure.  Given the symptoms patient was brought to the ER.  Patient's mother was unable to provide any more history at this time.  ED Course: In the ER patient had CT scan of the head and neck and abdomen.  Shows bilateral hip fracture the right one looks chronic in the left on those acute.  On-call orthopedic surgeon Dr. Lorin Mercy was consulted and is planning to have surgery tomorrow.  CAT scan of the abdomen also shows features concerning for pneumonia.  And also shows chronic left UPJ obstruction and bladder wall thickening.  UA was unremarkable.  CT of the head and C-spine shows large fat-containing soft tissue in the posterior part of the neck which will need further work-up differentials include lipoma versus liposarcoma.  Patient admitted for further management of hip fracture.  Review of Systems: As per HPI, rest all negative.   Past Medical History:  Diagnosis Date  . Down's syndrome   . GERD (gastroesophageal reflux disease)   . Mental retardation     History reviewed. No pertinent surgical history.   reports that he has never smoked. He has never used smokeless tobacco. He reports that he does not drink alcohol or use drugs.  No Known Allergies  Family History  Problem Relation Age of Onset  . Diabetes Mellitus II Neg Hx     Prior to Admission  medications   Medication Sig Start Date End Date Taking? Authorizing Provider  cholecalciferol (VITAMIN D3) 25 MCG (1000 UT) tablet Take 1,000 Units by mouth daily.   Yes [provider]  docusate sodium (COLACE) 100 MG capsule Take 100 mg by mouth 2 (two) times daily.   Yes [provider]  donepezil (ARICEPT) 5 MG tablet Take 5 mg by mouth daily.   Yes [provider]  guaiFENesin (MUCINEX) 600 MG 12 hr tablet Take 600 mg by mouth 2 (two) times daily.   Yes [provider]  hydrocortisone (ANUSOL-HC) 25 MG suppository Place 25 mg rectally 3 (three) times daily as needed for hemorrhoids.    Yes [provider]  Infant Care Products (BABY SHAMPOO EX) Apply 1 application topically daily. Use to wash eyes and face daily   Yes [provider]  levothyroxine (SYNTHROID) 88 MCG tablet Take 88 mcg by mouth daily before breakfast.   Yes [provider]  lubiprostone (AMITIZA) 24 MCG capsule Take 24 mcg by mouth 2 (two) times a day.   Yes [provider]  Melatonin 3 MG TABS Take 3 mg by mouth at bedtime.   Yes [provider]  memantine (NAMENDA XR) 28 MG CP24 24 hr capsule Take 28 mg by mouth daily.   Yes [provider]  mirabegron ER (MYRBETRIQ) 25 MG TB24 tablet Take 25 mg by mouth daily. At 4 pm   Yes [provider]  Probiotic Product (ALIGN PO)  Take 1 capsule by mouth every evening.   Yes [provider]  promethazine (PHENERGAN) 25 MG suppository Place 25 mg rectally every 4 (four) hours as needed for nausea or vomiting.   Yes [provider]  Sunscreens (BLISTEX LIP BALM EX) Apply 1 application topically 3 (three) times daily.   Yes [provider]  traZODone (DESYREL) 50 MG tablet Take 25 mg by mouth at bedtime.   Yes [provider]    Physical Exam: Constitutional: Moderately built and nourished. Vitals:   07/05/19 1157 07/05/19 1610 07/05/19 2022  BP:  120/82 109/68 140/72  Pulse: 83 89 84  Resp: 17 18 20   Temp: 98 F (36.7 C) 99.5 F (37.5 C)   TempSrc: Axillary Rectal   SpO2: 95% 97% 98%   Eyes: Anicteric no pallor. ENMT: No discharge from the ears eyes nose or mouth. Neck: Soft tissue mass palpable in the posterior upper aspect of the neck. Respiratory: No rhonchi or crepitations. Cardiovascular: S1-S2 heard. Abdomen: Soft nontender bowel sounds present. Musculoskeletal: No edema.  Some contractures seen. Skin: No rash. Neurologic: At the time of my exam patient was drowsy. Psychiatric: The time of my exam patient was drowsy.   Labs on Admission: I have personally reviewed following labs and imaging studies  CBC: Recent Labs  Lab 07/05/19 1353  WBC 13.8*  NEUTROABS 12.5*  HGB 15.2  HCT 46.0  MCV 103.8*  PLT 270   Basic Metabolic Panel: Recent Labs  Lab 07/05/19 1353  NA 140  K 3.6  CL 103  CO2 25  GLUCOSE 146*  BUN 22*  CREATININE 1.18  CALCIUM 8.9   GFR: CrCl cannot be calculated (Unknown ideal weight.). Liver Function Tests: Recent Labs  Lab 07/05/19 1353  AST 45*  ALT 26  ALKPHOS 62  BILITOT 1.0  PROT 7.7  ALBUMIN 3.6   No results for input(s): LIPASE, AMYLASE in the last 168 hours. No results for input(s): AMMONIA in the last 168 hours. Coagulation Profile: No results for input(s): INR, PROTIME in the last 168 hours. Cardiac Enzymes: Recent Labs  Lab 07/05/19 1353  CKTOTAL 791*   BNP (last 3 results) No results for input(s): PROBNP in the last 8760 hours. HbA1C: No results for input(s): HGBA1C in the last 72 hours. CBG: No results for input(s): GLUCAP in the last 168 hours. Lipid Profile: No results for input(s): CHOL, HDL, LDLCALC, TRIG, CHOLHDL, LDLDIRECT in the last 72 hours. Thyroid Function Tests: Recent Labs    07/05/19 1342  TSH 4.592*  FREET4 1.10   Anemia Panel: No results for input(s): VITAMINB12, FOLATE, FERRITIN, TIBC, IRON, RETICCTPCT in the last 72 hours.  Urine analysis:    Component Value Date/Time   COLORURINE YELLOW 07/05/2019 1338   APPEARANCEUR HAZY (A) 07/05/2019 1338   LABSPEC 1.014 07/05/2019 1338   PHURINE 7.0 07/05/2019 1338   GLUCOSEU NEGATIVE 07/05/2019 1338   HGBUR NEGATIVE 07/05/2019 1338   BILIRUBINUR NEGATIVE 07/05/2019 1338   KETONESUR NEGATIVE 07/05/2019 1338   PROTEINUR NEGATIVE 07/05/2019 1338   NITRITE NEGATIVE 07/05/2019 1338   LEUKOCYTESUR NEGATIVE 07/05/2019 1338   Sepsis Labs: @LABRCNTIP (procalcitonin:4,lacticidven:4) ) Recent Results (from the past 240 hour(s))  SARS Coronavirus 2 [facility] order, Performed in [facility] hospital lab) Nasopharyngeal Urine, Catheterized     Status: None   Collection Time: 07/05/19  1:41 PM   Specimen: Urine, Catheterized; Nasopharyngeal  Result Value Ref Range Status   SARS Coronavirus 2 NEGATIVE NEGATIVE Final    Comment: (NOTE) If result  is NEGATIVE SARS-CoV-2 target nucleic acids are NOT DETECTED. The SARS-CoV-2 RNA is generally detectable in upper and lower  respiratory specimens during the acute phase of infection. The lowest  concentration of SARS-CoV-2 viral copies this assay can detect is 250  copies / mL. A negative result does not preclude SARS-CoV-2 infection  and should not be used as the sole basis for treatment or other  patient management decisions.  A negative result may occur with  improper specimen collection / handling, submission of specimen other  than nasopharyngeal swab, presence of viral mutation(s) within the  areas targeted by this assay, and inadequate number of viral copies  (<250 copies / mL). A negative result must be combined with clinical  observations, patient history, and epidemiological information. If result is POSITIVE SARS-CoV-2 target nucleic acids are DETECTED. The SARS-CoV-2 RNA is generally detectable in upper and lower  respiratory specimens dur ing the acute phase of infection.  Positive  results are indicative of active  infection with SARS-CoV-2.  Clinical  correlation with patient history and other diagnostic information is  necessary to determine patient infection status.  Positive results do  not rule out bacterial infection or co-infection with other viruses. If result is PRESUMPTIVE POSTIVE SARS-CoV-2 nucleic acids MAY BE PRESENT.   A presumptive positive result was obtained on the submitted specimen  and confirmed on repeat testing.  While 2019 novel coronavirus  (SARS-CoV-2) nucleic acids may be present in the submitted sample  additional confirmatory testing may be necessary for epidemiological  and / or clinical management purposes  to differentiate between  SARS-CoV-2 and other Sarbecovirus currently known to infect humans.  If clinically indicated additional testing with an alternate test  methodology [phone]) is advised. The SARS-CoV-2 RNA is generally  detectable in upper and lower respiratory sp ecimens during the acute  phase of infection. The expected result is Negative. Fact Sheet for Patients:  BoilerBrush.com.cy Fact Sheet for Healthcare Providers: https://pope.com/ This test is not yet approved or cleared by the [place] FDA and has been authorized for detection and/or diagnosis of SARS-CoV-2 by FDA under an Emergency Use Authorization (EUA).  This EUA will remain in effect (meaning this test can be used) for the duration of the COVID-19 declaration under Section 564(b)(1) of the Act, 21 U.S.C. section 360bbb-3(b)(1), unless the authorization is terminated or revoked sooner. Performed at [facility], [address] [place], [place] [address]      Radiological Exams on Admission: Dg Chest 1 View  Result Date: 07/05/2019 CLINICAL DATA:  58 year old male with dementia and altered level of consciousness/activity level today. EXAM: CHEST  1 VIEW COMPARISON:  Prior chest x-ray 07/19/2009 FINDINGS: The lungs  are clear and negative for focal airspace consolidation, pulmonary edema or suspicious pulmonary nodule. No pleural effusion or pneumothorax. Cardiac and mediastinal contours are within normal limits. Atherosclerotic calcifications visualized along the aorta. No acute fracture or lytic or blastic osseous lesions. The visualized upper abdominal bowel gas pattern is unremarkable. IMPRESSION: No active disease. Electronically Signed   By: Malachy Moan M.D.   On: 07/05/2019 15:28   Dg Knee 2 Views Left  Result Date: 07/05/2019 CLINICAL DATA:  Pain and redness. EXAM: LEFT KNEE - 1-2 VIEW COMPARISON:  No recent. FINDINGS: No acute bony or joint abnormality identified. No evidence of fracture dislocation. IMPRESSION: No acute abnormality. Electronically Signed   By: Maisie Fus  Register   On: 07/05/2019 13:19   Ct Head Wo Contrast  Result Date: 07/05/2019 CLINICAL  DATA:  Altered mental status.  No known injury EXAM: CT HEAD WITHOUT CONTRAST CT CERVICAL SPINE WITHOUT CONTRAST TECHNIQUE: Multidetector CT imaging of the head and cervical spine was performed following the standard protocol without intravenous contrast. Multiplanar CT image reconstructions of the cervical spine were also generated. COMPARISON:  None. FINDINGS: CT HEAD FINDINGS Brain: Motion degraded study.  Multiple repeats Moderate atrophy without hydrocephalus. No acute infarct. Negative for acute hemorrhage or mass. Benign-appearing calcification basal ganglia bilaterally. Vascular: Negative for hyperdense vessel Skull: Negative Sinuses/Orbits: Mild mucosal edema left maxillary sinus likely odontogenic with periapical abscess left upper teeth. Other: None CT CERVICAL SPINE FINDINGS Alignment: 2 mm anterolisthesis C3-4 and C7-T1. Mild retrolisthesis C4-5 and C5-6. Mild anterolisthesis C6-7. Skull base and vertebrae: Negative for cervical spine fracture Soft tissues and spinal canal: Fat containing mass in the subcutaneous tissues of the posterior  neck measures approximately 6.6 x 3.1 x 8.3 cm. There is stranding within the fatty mass as well as stranding in the surrounding tissues. Disc levels: Multilevel disc and facet degeneration throughout the cervical spine. Multilevel foraminal stenosis due to spurring. Upper chest: Negative Other: Motion degraded study.  Multiple repeat images. IMPRESSION: 1. Motion degraded CT head and cervical spine 2. No acute intracranial abnormality.  Moderate atrophy. 3. Cervical spondylosis.  Negative for fracture. 4. Large fat containing mass in the subcutaneous tissues posterior neck. This may represent a lipoma or liposarcoma. Physical examination and close follow-up recommended. Electronically Signed   By: Marlan Palau M.D.   On: 07/05/2019 18:44   Ct Cervical Spine Wo Contrast  Result Date: 07/05/2019 CLINICAL DATA:  Altered mental status.  No known injury EXAM: CT HEAD WITHOUT CONTRAST CT CERVICAL SPINE WITHOUT CONTRAST TECHNIQUE: Multidetector CT imaging of the head and cervical spine was performed following the standard protocol without intravenous contrast. Multiplanar CT image reconstructions of the cervical spine were also generated. COMPARISON:  None. FINDINGS: CT HEAD FINDINGS Brain: Motion degraded study.  Multiple repeats Moderate atrophy without hydrocephalus. No acute infarct. Negative for acute hemorrhage or mass. Benign-appearing calcification basal ganglia bilaterally. Vascular: Negative for hyperdense vessel Skull: Negative Sinuses/Orbits: Mild mucosal edema left maxillary sinus likely odontogenic with periapical abscess left upper teeth. Other: None CT CERVICAL SPINE FINDINGS Alignment: 2 mm anterolisthesis C3-4 and C7-T1. Mild retrolisthesis C4-5 and C5-6. Mild anterolisthesis C6-7. Skull base and vertebrae: Negative for cervical spine fracture Soft tissues and spinal canal: Fat containing mass in the subcutaneous tissues of the posterior neck measures approximately 6.6 x 3.1 x 8.3 cm. There is  stranding within the fatty mass as well as stranding in the surrounding tissues. Disc levels: Multilevel disc and facet degeneration throughout the cervical spine. Multilevel foraminal stenosis due to spurring. Upper chest: Negative Other: Motion degraded study.  Multiple repeat images. IMPRESSION: 1. Motion degraded CT head and cervical spine 2. No acute intracranial abnormality.  Moderate atrophy. 3. Cervical spondylosis.  Negative for fracture. 4. Large fat containing mass in the subcutaneous tissues posterior neck. This may represent a lipoma or liposarcoma. Physical examination and close follow-up recommended. Electronically Signed   By: Marlan Palau M.D.   On: 07/05/2019 18:44   Ct Abdomen Pelvis W Contrast  Result Date: 07/05/2019 CLINICAL DATA:  had a BM in bed. According to nursing home personnel he usually walks and moves around as he wants to but today is not wanting to walk or stand. He is guarding his knee (side is questionable). No reported mechanism of injury, fall, or trauma. Patient has a  history of down syndrome and is non verbal and combative. EXAM: CT ABDOMEN AND PELVIS WITH CONTRAST TECHNIQUE: Multidetector CT imaging of the abdomen and pelvis was performed using the standard protocol following bolus administration of intravenous contrast. CONTRAST:  OMNIPAQUE IOHEXOL 300 MG/ML  SOLN COMPARISON:  01/16/2012 FINDINGS: Lower chest: No pleural or pericardial effusion. Patchy airspace opacities in the medial basal segment right lower lobe, new since previous. Hepatobiliary: No focal liver abnormality is seen. No gallstones, gallbladder wall thickening, or biliary dilatation. Pancreas: Unremarkable. No pancreatic ductal dilatation or surrounding inflammatory changes. Spleen: Normal in size. Scattered punctate calcified granulomas. Adrenals/Urinary Tract: Normal adrenals. Right kidney unremarkable. Chronic left UPJ obstruction. No renal mass. Urinary bladder incompletely distended,  thick-walled. Stomach/Bowel: Small hiatal hernia. Stomach is nondilated. Small bowel decompressed. Normal appendix. The colon is nondilated. Scattered sigmoid diverticula without significant adjacent inflammatory/edematous change or abscess. Moderate rectal fecal material. Vascular/Lymphatic: No significant vascular findings are present. No enlarged abdominal or pelvic lymph nodes. Reproductive: Prostate normal in size with central coarse calcifications. Other: No ascites. No free air. Musculoskeletal: Comminuted left intertrochanteric femur fracture with mild varus deformity. Regional soft tissue swelling. Chronic appearing subcapital or midcervical right femur fracture, new since 01/16/2012, with resorption along the fracture lines. Bony pelvis intact. Degenerative disc disease L1-2. 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 IMPRESSION: 1. Comminuted left intertrochanteric femur fracture with mild varus deformity. 2. 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 3. Patchy airspace opacities in the medial basal segment right lower lobe, new since previous study, possibly infectious/inflammatory. 4.  Sigmoid diverticulosis. 5. Small hiatal hernia. 6. Chronic left UPJ obstruction. 7. Chronic diffuse thickening of the wall of the urinary bladder. Electronically Signed   By: Corlis Leak M.D.   On: 07/05/2019 18:31   Dg Knee Complete 4 Views Right  Result Date: 07/05/2019 CLINICAL DATA:  Right knee pain EXAM: RIGHT KNEE - COMPLETE 4+ VIEW COMPARISON:  None. FINDINGS: No evidence of fracture, dislocation, or joint effusion. No evidence of arthropathy or other focal bone abnormality. Soft tissues are unremarkable. IMPRESSION: Negative. Electronically Signed   By: Elige Ko   On: 07/05/2019 15:24   Dg Hips Bilat W Or Wo Pelvis 3-4 Views  Result Date: 07/05/2019 CLINICAL DATA:  Patient unable to walk today. EXAM: DG HIP (WITH OR WITHOUT PELVIS) 3-4V BILAT  COMPARISON:  None. FINDINGS: Displaced right subcapital femoral neck fracture. Slight sclerosis along the fracture cleft which may reflect a subacute injury. Comminuted left intertrochanteric fracture with apex lateral angulation. No other acute fracture or dislocation. No aggressive osseous lesion. IMPRESSION: Displaced right subcapital femoral neck fracture. Slight sclerosis along the fracture cleft which may reflect a subacute injury. Comminuted left intertrochanteric fracture with apex lateral angulation. Electronically Signed   By: Elige Ko   On: 07/05/2019 15:26    EKG: Independently reviewed.  Normal sinus rhythm though computer is reading as A. fib.  Assessment/Plan Active Problems:   Closed intertrochanteric fracture of hip, left, initial encounter (HCC)   Closed displaced fracture of right femoral neck (HCC)   Hip fracture (HCC)   Hypothyroidism   Closed left hip fracture, initial encounter (HCC)    1. Bilateral hip fracture the left one appears to be acute as per the orthopedic surgeon and the right one looks chronic plan is to get surgery done on the left hip at this time.  Patient will be kept n.p.o. in the morning and anticipation of surgery.  IV  fluids pain relief medications.  Patient will be at moderate risk for intermediate risk procedure. 2. Possible pneumonia with possible sepsis given the elevated lactate though patient is presently hemodynamically stable.  I have kept patient on IV fluids antibiotics will get blood cultures procalcitonin.  If procalcitonin is negative may discontinue antibiotics.  Follow lactate. 3. Hypothyroidism we will dose IV Synthroid for now until patient can take orally. 4. History of dementia and intellect disability with history of Down syndrome on Aricept trazodone and Namenda. 5. Soft tissue swelling in the posterior aspect of the neck concerning for lipoma versus liposarcoma will need further work-up as outpatient. 6. Chronic left UPJ  obstruction UA unremarkable.  Closely observe.  Given the patient will need surgery and will also need close follow-up for any further deterioration given the possibility of pneumonia patient will need more than 2 night stay and inpatient status.   DVT prophylaxis: SCDs in anticipation of possible surgery. Code Status: Full code confirmed with patient's mother. Family Communication: Patient's mother. Disposition Plan: May need rehab. Consults called: Orthopedics. Admission status: Inpatient.   Eduard ClosArshad N Joanna Hall MD [facility] Pager [phone].  If 7PM-7AM, please contact night-coverage [email or web address] Password [email or web address]  07/05/2019, 9:09 PM

## 2019-07-05 NOTE — ED Notes (Signed)
Brian Stanton [phone] healthcare nurse at his facility. She needs nurse to call back to give update.
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
## 2019-07-05 NOTE — ED Notes (Signed)
Date and time results received: 07/05/19 5:12 PM  (use smartphrase ".now" to insert current time)  Test: Lactic Acid Critical Value: 4.3  Name of Provider Notified: Milus Banister  Orders Received? Or Actions Taken?: notified MD

## 2019-07-05 NOTE — ED Notes (Signed)
Pt presents non verbally, has cognitive deficits, is alert and responsive to verbal stimulation. Pt has left knee  Pain and redness, pt yells "ouch" when area is touched and is guarding of area.

## 2019-07-05 NOTE — ED Notes (Addendum)
Nita from group home called to get update on pt. Stated she can not leave until he is officially admitted to the hospital. Would like a call back so she can leave.  Call back number [phone]
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
## 2019-07-05 NOTE — ED Notes (Signed)
Delay on EKG.

## 2019-07-05 NOTE — H&P (View-Only) (Signed)
Reason for Consult:left closed IT hip Fx, right femoral neck Fx Referring Physician: Theotis Burrow MD  Brian Stanton is an 58 y.o. male.  HPI: 58 year old 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, dementia lives in a group home [place] in a been in bed unable to walk had a bowel movement in the bed.  He was brought and were x-rays demonstrated bilateral hip fractures with left intertrochanteric hip fracture that looks acute and right femoral neck fracture which is likely subacute.  Patient does not talk his mother has power of attorney.  Patient had been in the ER in June with GERD not able to eat or drink for 3 days and admit laying in bed constantly which may be appropriate timing for his right femoral neck fracture possibly.  Past Medical History:  Diagnosis Date  . Down's syndrome   . GERD (gastroesophageal reflux disease)   . Mental retardation     History reviewed. No pertinent surgical history.  No family history on file.  Social History:  reports that he has never smoked. He does not have any smokeless tobacco history on file. He reports that he does not drink alcohol or use drugs.  Allergies: No Known Allergies  Medications: I have reviewed the patient's current medications.  Results for orders placed or performed during the hospital encounter of 07/05/19 (from the past 48 hour(s))  Urinalysis, Routine w reflex microscopic     Status: Abnormal   Collection Time: 07/05/19  1:38 PM  Result Value Ref Range   Color, Urine YELLOW YELLOW   APPearance HAZY (A) CLEAR   Specific Gravity, Urine 1.014 1.005 - 1.030   pH 7.0 5.0 - 8.0   Glucose, UA NEGATIVE NEGATIVE mg/dL   Hgb urine dipstick NEGATIVE NEGATIVE   Bilirubin Urine NEGATIVE NEGATIVE   Ketones, ur NEGATIVE NEGATIVE mg/dL   Protein, ur NEGATIVE NEGATIVE mg/dL   Nitrite NEGATIVE NEGATIVE   Leukocytes,Ua NEGATIVE NEGATIVE    Comment: Performed at [facility] [address], [place], [place] [address]  SARS  Coronavirus 2 [facility] order, Performed in [facility] hospital lab) Nasopharyngeal Urine, Catheterized     Status: None   Collection Time: 07/05/19  1:41 PM   Specimen: Urine, Catheterized; Nasopharyngeal  Result Value Ref Range   SARS Coronavirus 2 NEGATIVE NEGATIVE    Comment: (NOTE) If result is NEGATIVE SARS-CoV-2 target nucleic acids are NOT DETECTED. The SARS-CoV-2 RNA is generally detectable in upper and lower  respiratory specimens during the acute phase of infection. The lowest  concentration of SARS-CoV-2 viral copies this assay can detect is 250  copies / mL. A negative result does not preclude SARS-CoV-2 infection  and should not be used as the sole basis for treatment or other  patient management decisions.  A negative result may occur with  improper specimen collection / handling, submission of specimen other  than nasopharyngeal swab, presence of viral mutation(s) within the  areas targeted by this assay, and inadequate number of viral copies  (<250 copies / mL). A negative result must be combined with clinical  observations, patient history, and epidemiological information. If result is POSITIVE SARS-CoV-2 target nucleic acids are DETECTED. The SARS-CoV-2 RNA is generally detectable in upper and lower  respiratory specimens dur ing the acute phase of infection.  Positive  results are indicative of active infection with SARS-CoV-2.  Clinical  correlation with patient history and other diagnostic information is  necessary to determine patient infection status.  Positive results do  not  rule out bacterial infection or co-infection with other viruses. If result is PRESUMPTIVE POSTIVE SARS-CoV-2 nucleic acids MAY BE PRESENT.   A presumptive positive result was obtained on the submitted specimen  and confirmed on repeat testing.  While 2019 novel coronavirus  (SARS-CoV-2) nucleic acids may be present in the submitted sample  additional confirmatory testing may be  necessary for epidemiological  and / or clinical management purposes  to differentiate between  SARS-CoV-2 and other Sarbecovirus currently known to infect humans.  If clinically indicated additional testing with an alternate test  methodology [phone] is advised. The SARS-CoV-2 RNA is generally  detectable in upper and lower respiratory sp ecimens during the acute  phase of infection. The expected result is Negative. Fact Sheet for Patients:  BoilerBrush.com.cyhttps://www.fda.gov/media/136312/download Fact Sheet for Healthcare Providers: https://pope.com/https://www.fda.gov/media/136313/download This test is not yet approved or cleared by the [place] FDA and has been authorized for detection and/or diagnosis of SARS-CoV-2 by FDA under an Emergency Use Authorization (EUA).  This EUA will remain in effect (meaning this test can be used) for the duration of the COVID-19 declaration under Section 564(b)(1) of the Act, 21 U.S.C. section 360bbb-3(b)(1), unless the authorization is terminated or revoked sooner. Performed at [facility], [address] [place], [place] [address]   Lactic acid, plasma     Status: Abnormal   Collection Time: 07/05/19  1:42 PM  Result Value Ref Range   Lactic Acid, Venous 4.3 (HH) 0.5 - 1.9 mmol/L    Comment: CRITICAL RESULT CALLED TO, READ BACK BY AND VERIFIED WITH: A.BULLOCK AT 1710 ON 07/05/19 BY N.THOMPSON Performed at [facility], [address] [place], [place] [address]   TSH     Status: Abnormal   Collection Time: 07/05/19  1:42 PM  Result Value Ref Range   TSH 4.592 (H) 0.350 - 4.500 uIU/mL    Comment: Performed by a 3rd Generation assay with a functional sensitivity of <=0.01 uIU/mL. Performed at [facility], [address] [place], [place] [address]   CBC with Differential     Status: Abnormal   Collection Time: 07/05/19  1:53 PM  Result Value Ref Range   WBC 13.8 (H) 4.0 - 10.5 K/uL   RBC 4.43 4.22 - 5.81  MIL/uL   Hemoglobin 15.2 13.0 - 17.0 g/dL   HCT 08.646.0 57.839.0 - 46.952.0 %   MCV 103.8 (H) 80.0 - 100.0 fL   MCH 34.3 (H) 26.0 - 34.0 pg   MCHC 33.0 30.0 - 36.0 g/dL   RDW 62.913.9 52.811.5 - 41.315.5 %   Platelets 270 150 - 400 K/uL   nRBC 0.0 0.0 - 0.2 %   Neutrophils Relative % 90 %   Neutro Abs 12.5 (H) 1.7 - 7.7 K/uL   Lymphocytes Relative 4 %   Lymphs Abs 0.5 (L) 0.7 - 4.0 K/uL   Monocytes Relative 5 %   Monocytes Absolute 0.7 0.1 - 1.0 K/uL   Eosinophils Relative 0 %   Eosinophils Absolute 0.0 0.0 - 0.5 K/uL   Basophils Relative 0 %   Basophils Absolute 0.1 0.0 - 0.1 K/uL   Immature Granulocytes 1 %   Abs Immature Granulocytes 0.07 0.00 - 0.07 K/uL    Comment: Performed at [facility], [address] [place], [place] [address]  Comprehensive metabolic panel     Status: Abnormal   Collection Time: 07/05/19  1:53 PM  Result Value Ref Range   Sodium 140 135 - 145 mmol/L  Potassium 3.6 3.5 - 5.1 mmol/L   Chloride 103 98 - 111 mmol/L   CO2 25 22 - 32 mmol/L   Glucose, Bld 146 (H) 70 - 99 mg/dL   BUN 22 (H) 6 - 20 mg/dL   Creatinine, Ser 6.96 0.61 - 1.24 mg/dL   Calcium 8.9 8.9 - 29.5 mg/dL   Total Protein 7.7 6.5 - 8.1 g/dL   Albumin 3.6 3.5 - 5.0 g/dL   AST 45 (H) 15 - 41 U/L   ALT 26 0 - 44 U/L   Alkaline Phosphatase 62 38 - 126 U/L   Total Bilirubin 1.0 0.3 - 1.2 mg/dL   GFR calc non Af Amer >60 >60 mL/min   GFR calc Af Amer >60 >60 mL/min   Anion gap 12 5 - 15    Comment: Performed at [facility], [address] [place], [place] [address]  Troponin I (High Sensitivity)     Status: None   Collection Time: 07/05/19  1:53 PM  Result Value Ref Range   Troponin I (High Sensitivity) 17 <18 ng/L    Comment: (NOTE) Elevated high sensitivity troponin I (hsTnI) values and significant  changes across serial measurements may suggest ACS but many other  chronic and acute conditions are known to elevate hsTnI results.  Refer to the "Links" section for  chest pain algorithms and additional  guidance. Performed at [facility], [address] [place], [place] [address]   CK     Status: Abnormal   Collection Time: 07/05/19  1:53 PM  Result Value Ref Range   Total CK 791 (H) 49 - 397 U/L    Comment: Performed at [facility], [address], [place], [place] [address]    Dg Chest 1 View  Result Date: 07/05/2019 CLINICAL DATA:  58 year old male with dementia and altered level of consciousness/activity level today. EXAM: CHEST  1 VIEW COMPARISON:  Prior chest x-ray 07/19/2009 FINDINGS: The lungs are clear and negative for focal airspace consolidation, pulmonary edema or suspicious pulmonary nodule. No pleural effusion or pneumothorax. Cardiac and mediastinal contours are within normal limits. Atherosclerotic calcifications visualized along the aorta. No acute fracture or lytic or blastic osseous lesions. The visualized upper abdominal bowel gas pattern is unremarkable. IMPRESSION: No active disease. Electronically Signed   By: Malachy Moan M.D.   On: 07/05/2019 15:28   Dg Knee 2 Views Left  Result Date: 07/05/2019 CLINICAL DATA:  Pain and redness. EXAM: LEFT KNEE - 1-2 VIEW COMPARISON:  No recent. FINDINGS: No acute bony or joint abnormality identified. No evidence of fracture dislocation. IMPRESSION: No acute abnormality. Electronically Signed   By: Maisie Fus  Register   On: 07/05/2019 13:19   Ct Abdomen Pelvis W Contrast  Result Date: 07/05/2019 CLINICAL DATA:  had a BM in bed. According to nursing home personnel he usually walks and moves around as he wants to but today is not wanting to walk or stand. He is guarding his knee (side is questionable). No reported mechanism of injury, fall, or trauma. Patient has a history of down syndrome and is non verbal and combative. EXAM: CT ABDOMEN AND PELVIS WITH CONTRAST TECHNIQUE: Multidetector CT imaging of the abdomen and pelvis was performed using the standard  protocol following bolus administration of intravenous contrast. CONTRAST:  OMNIPAQUE IOHEXOL 300 MG/ML  SOLN COMPARISON:  01/16/2012 FINDINGS: Lower chest: No pleural or pericardial effusion. Patchy airspace opacities in the medial basal segment right lower lobe, new since previous.  Hepatobiliary: No focal liver abnormality is seen. No gallstones, gallbladder wall thickening, or biliary dilatation. Pancreas: Unremarkable. No pancreatic ductal dilatation or surrounding inflammatory changes. Spleen: Normal in size. Scattered punctate calcified granulomas. Adrenals/Urinary Tract: Normal adrenals. Right kidney unremarkable. Chronic left UPJ obstruction. No renal mass. Urinary bladder incompletely distended, thick-walled. Stomach/Bowel: Small hiatal hernia. Stomach is nondilated. Small bowel decompressed. Normal appendix. The colon is nondilated. Scattered sigmoid diverticula without significant adjacent inflammatory/edematous change or abscess. Moderate rectal fecal material. Vascular/Lymphatic: No significant vascular findings are present. No enlarged abdominal or pelvic lymph nodes. Reproductive: Prostate normal in size with central coarse calcifications. Other: No ascites. No free air. Musculoskeletal: Comminuted left intertrochanteric femur fracture with mild varus deformity. Regional soft tissue swelling. Chronic appearing subcapital or midcervical right femur fracture, new since 01/16/2012, with resorption along the fracture lines. Bony pelvis intact. Degenerative disc disease L1-2. 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 IMPRESSION: 1. Comminuted left intertrochanteric femur fracture with mild varus deformity. 2. 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 3. Patchy airspace opacities in the medial basal segment right lower lobe, new since previous study, possibly infectious/inflammatory. 4.  Sigmoid diverticulosis. 5. Small hiatal hernia. 6. Chronic  left UPJ obstruction. 7. Chronic diffuse thickening of the wall of the urinary bladder. Electronically Signed   By: Corlis Leak M.D.   On: 07/05/2019 18:31   Dg Knee Complete 4 Views Right  Result Date: 07/05/2019 CLINICAL DATA:  Right knee pain EXAM: RIGHT KNEE - COMPLETE 4+ VIEW COMPARISON:  None. FINDINGS: No evidence of fracture, dislocation, or joint effusion. No evidence of arthropathy or other focal bone abnormality. Soft tissues are unremarkable. IMPRESSION: Negative. Electronically Signed   By: Elige Ko   On: 07/05/2019 15:24   Dg Hips Bilat W Or Wo Pelvis 3-4 Views  Result Date: 07/05/2019 CLINICAL DATA:  Patient unable to walk today. EXAM: DG HIP (WITH OR WITHOUT PELVIS) 3-4V BILAT COMPARISON:  None. FINDINGS: Displaced right subcapital femoral neck fracture. Slight sclerosis along the fracture cleft which may reflect a subacute injury. Comminuted left intertrochanteric fracture with apex lateral angulation. No other acute fracture or dislocation. No aggressive osseous lesion. IMPRESSION: Displaced right subcapital femoral neck fracture. Slight sclerosis along the fracture cleft which may reflect a subacute injury. Comminuted left intertrochanteric fracture with apex lateral angulation. Electronically Signed   By: Elige Ko   On: 07/05/2019 15:26    ROS review of systems unobtainable due to patient's dementia, Down syndrome and nonverbal.  Patient likes to look at books per chart.  His mother relates that he will verbalize to her but not others that he does not know. Blood pressure 109/68, pulse 89, temperature 99.5 F (37.5 C), temperature source Rectal, resp. rate 18, SpO2 97 %. Physical Exam  HENT:  Head: Atraumatic.  Eyes: EOM are normal.  Neck: No tracheal deviation present. No thyromegaly present.  Cardiovascular: Normal rate.  Respiratory: Effort normal.  Musculoskeletal:     Comments: Both hips shortened and maximal external rotation with his ankles crossed.   Neurological:  Patient nonverbal.  Skin: Skin is warm.  Scrotum and buttocks covered with stool.  Poor hygiene.    Assessment/Plan: Patient with bilateral hip fractures.  Left intertrochanteric hip fracture is comminuted and appears acute.  Opposite right hip femoral neck appears chronic and may be weeks versus months old.  Discussed with patient's mother by telephone outline plan for treatment with trochanteric nail fixation of his comminuted intertrochanteric hip fracture  on the left.  P stable for the first after the procedure then we will proceed with hemiarthroplasty for his opposite right hip which is more chronic and there is less swelling around the right hip.  Surgery scheduled at around 5:30 PM on Wednesday. My cell [phone]  [name] 07/05/2019, 6:35 PM

## 2019-07-05 NOTE — ED Triage Notes (Signed)
Pt BIBA from group home. Pt is refusing to walk this morning, which may be knee pain. Pt combative and refused vitals with EMS.   Pt also refused vital signs with staff here.

## 2019-07-05 NOTE — ED Notes (Addendum)
Tamika Watson-RN called from facility that cares for pt, wondering when pt will be admitted and what his diagnosis is.

## 2019-07-06 ENCOUNTER — Inpatient Hospital Stay (HOSPITAL_COMMUNITY): Payer: Medicare Other

## 2019-07-06 ENCOUNTER — Encounter (HOSPITAL_COMMUNITY)
Admission: EM | Disposition: A | Discharge status: Skilled Nursing Facility | Payer: Self-pay | Source: Home / Self Care | Attending: Internal Medicine

## 2019-07-06 ENCOUNTER — Encounter (HOSPITAL_COMMUNITY): Payer: Self-pay | Admitting: General Practice

## 2019-07-06 ENCOUNTER — Inpatient Hospital Stay (HOSPITAL_COMMUNITY): Payer: Medicare Other | Admitting: Certified Registered Nurse Anesthetist

## 2019-07-06 DIAGNOSIS — D72829 Elevated white blood cell count, unspecified: Secondary | ICD-10-CM

## 2019-07-06 DIAGNOSIS — J189 Pneumonia, unspecified organism: Secondary | ICD-10-CM

## 2019-07-06 DIAGNOSIS — S72145A Nondisplaced intertrochanteric fracture of left femur, initial encounter for closed fracture: Principal | ICD-10-CM

## 2019-07-06 DIAGNOSIS — E039 Hypothyroidism, unspecified: Secondary | ICD-10-CM

## 2019-07-06 DIAGNOSIS — S72002A Fracture of unspecified part of neck of left femur, initial encounter for closed fracture: Secondary | ICD-10-CM

## 2019-07-06 HISTORY — PX: HIP ARTHROPLASTY: SHX981

## 2019-07-06 HISTORY — PX: INTRAMEDULLARY (IM) NAIL INTERTROCHANTERIC: SHX5875

## 2019-07-06 LAB — BASIC METABOLIC PANEL
Anion gap: 9 (ref 5–15)
BUN: 21 mg/dL — ABNORMAL HIGH (ref 6–20)
CO2: 26 mmol/L (ref 22–32)
Calcium: 8.6 mg/dL — ABNORMAL LOW (ref 8.9–10.3)
Chloride: 105 mmol/L (ref 98–111)
Creatinine, Ser: 1.21 mg/dL (ref 0.61–1.24)
GFR calc Af Amer: >60 mL/min (ref >60)
GFR calc non Af Amer: >60 mL/min (ref >60)
Glucose, Bld: 143 mg/dL — ABNORMAL HIGH (ref 70–99)
Potassium: 4.5 mmol/L (ref 3.5–5.1)
Sodium: 140 mmol/L (ref 135–145)

## 2019-07-06 LAB — URINALYSIS, ROUTINE W REFLEX MICROSCOPIC
Bilirubin Urine: NEGATIVE
Glucose, UA: NEGATIVE mg/dL
Hgb urine dipstick: NEGATIVE
Ketones, ur: NEGATIVE mg/dL
Leukocytes,Ua: NEGATIVE
Nitrite: NEGATIVE
Protein, ur: NEGATIVE mg/dL
Specific Gravity, Urine: >1.046 — ABNORMAL HIGH (ref 1.005–1.030)
pH: 6 (ref 5.0–8.0)

## 2019-07-06 LAB — CBC
HCT: 41.6 % (ref 39.0–52.0)
Hemoglobin: 13.7 g/dL (ref 13.0–17.0)
MCH: 34.3 pg — ABNORMAL HIGH (ref 26.0–34.0)
MCHC: 32.9 g/dL (ref 30.0–36.0)
MCV: 104 fL — ABNORMAL HIGH (ref 80.0–100.0)
Platelets: 232 10*3/uL (ref 150–400)
RBC: 4 MIL/uL — ABNORMAL LOW (ref 4.22–5.81)
RDW: 14 % (ref 11.5–15.5)
WBC: 11.8 10*3/uL — ABNORMAL HIGH (ref 4.0–10.5)
nRBC: 0 % (ref 0.0–0.2)

## 2019-07-06 LAB — GLUCOSE, CAPILLARY
Glucose-Capillary: 122 mg/dL — ABNORMAL HIGH (ref 70–99)
Glucose-Capillary: 132 mg/dL — ABNORMAL HIGH (ref 70–99)
Glucose-Capillary: 143 mg/dL — ABNORMAL HIGH (ref 70–99)

## 2019-07-06 LAB — POCT I-STAT EG7
Acid-Base Excess: 2 mmol/L (ref 0.0–2.0)
Bicarbonate: 26.5 mmol/L (ref 20.0–28.0)
Calcium, Ion: 1.13 mmol/L — ABNORMAL LOW (ref 1.15–1.40)
HCT: 26 % — ABNORMAL LOW (ref 39.0–52.0)
Hemoglobin: 8.8 g/dL — ABNORMAL LOW (ref 13.0–17.0)
O2 Saturation: 100 %
Potassium: 4.1 mmol/L (ref 3.5–5.1)
Sodium: 142 mmol/L (ref 135–145)
TCO2: 28 mmol/L (ref 22–32)
pCO2, Ven: 41.4 mmHg — ABNORMAL LOW (ref 44.0–60.0)
pH, Ven: 7.414 (ref 7.250–7.430)
pO2, Ven: 223 mmHg — ABNORMAL HIGH (ref 32.0–45.0)

## 2019-07-06 LAB — MRSA PCR SCREENING: MRSA by PCR: NEGATIVE

## 2019-07-06 LAB — URINE CULTURE: Culture: NO GROWTH

## 2019-07-06 LAB — TYPE AND SCREEN
ABO/RH(D): O POS
Antibody Screen: NEGATIVE

## 2019-07-06 LAB — HIV ANTIBODY (ROUTINE TESTING W REFLEX): HIV Screen 4th Generation wRfx: NONREACTIVE

## 2019-07-06 LAB — LACTIC ACID, PLASMA: Lactic Acid, Venous: 1.8 mmol/L (ref 0.5–1.9)

## 2019-07-06 LAB — ABO/RH: ABO/RH(D): O POS

## 2019-07-06 SURGERY — FIXATION, FRACTURE, INTERTROCHANTERIC, WITH INTRAMEDULLARY ROD
Anesthesia: General | Site: Hip | Laterality: Right

## 2019-07-06 MED ORDER — CEFAZOLIN SODIUM-DEXTROSE 2-4 GM/100ML-% IV SOLN
INTRAVENOUS | Status: AC
  Filled 2019-07-06: qty 100

## 2019-07-06 MED ORDER — LIDOCAINE 2% (20 MG/ML) 5 ML SYRINGE
INTRAMUSCULAR | Status: AC
  Filled 2019-07-06: qty 5

## 2019-07-06 MED ORDER — ONDANSETRON HCL 4 MG/2ML IJ SOLN: 4.0000 mg | Freq: Once | INTRAMUSCULAR | Status: DC | PRN

## 2019-07-06 MED ORDER — LIDOCAINE 2% (20 MG/ML) 5 ML SYRINGE
INTRAMUSCULAR | Status: DC | PRN
  Administered 2019-07-06: 75 mg via INTRAVENOUS

## 2019-07-06 MED ORDER — ALBUMIN HUMAN 5 % IV SOLN
INTRAVENOUS | Status: DC | PRN
  Administered 2019-07-06 (×2): via INTRAVENOUS

## 2019-07-06 MED ORDER — LEVOTHYROXINE SODIUM 100 MCG/5ML IV SOLN
44.0000 ug | Freq: Every day | INTRAVENOUS | Status: DC
  Administered 2019-07-06 – 2019-07-07 (×2): 44 ug via INTRAVENOUS
  Filled 2019-07-06 (×2): qty 5

## 2019-07-06 MED ORDER — STERILE WATER FOR IRRIGATION IR SOLN
Status: DC | PRN
  Administered 2019-07-06: 2000 mL

## 2019-07-06 MED ORDER — PHENOL 1.4 % MT LIQD
1.0000 | OROMUCOSAL | Status: DC | PRN
  Filled 2019-07-06: qty 177

## 2019-07-06 MED ORDER — METOCLOPRAMIDE HCL 5 MG/ML IJ SOLN: 5.0000 mg | Freq: Three times a day (TID) | INTRAMUSCULAR | Status: DC | PRN

## 2019-07-06 MED ORDER — MENTHOL 3 MG MT LOZG: 1.0000 | LOZENGE | OROMUCOSAL | Status: DC | PRN

## 2019-07-06 MED ORDER — DEXAMETHASONE SODIUM PHOSPHATE 10 MG/ML IJ SOLN
INTRAMUSCULAR | Status: DC | PRN
  Administered 2019-07-06: 10 mg via INTRAVENOUS

## 2019-07-06 MED ORDER — METOCLOPRAMIDE HCL 5 MG PO TABS: 5.0000 mg | ORAL_TABLET | Freq: Three times a day (TID) | ORAL | Status: DC | PRN

## 2019-07-06 MED ORDER — LACTATED RINGERS IV SOLN
INTRAVENOUS | Status: DC
  Administered 2019-07-06: 17:00:00 via INTRAVENOUS

## 2019-07-06 MED ORDER — FENTANYL CITRATE (PF) 250 MCG/5ML IJ SOLN
INTRAMUSCULAR | Status: AC
  Filled 2019-07-06: qty 5

## 2019-07-06 MED ORDER — BUPIVACAINE HCL (PF) 0.25 % IJ SOLN
INTRAMUSCULAR | Status: AC
  Filled 2019-07-06: qty 30

## 2019-07-06 MED ORDER — ASPIRIN EC 325 MG PO TBEC
325.0000 mg | DELAYED_RELEASE_TABLET | Freq: Every day | ORAL | Status: DC
  Administered 2019-07-07 – 2019-07-12 (×6): 325 mg via ORAL
  Filled 2019-07-06 (×7): qty 1

## 2019-07-06 MED ORDER — ENSURE PRE-SURGERY PO LIQD
296.0000 mL | Freq: Once | ORAL | Status: DC
  Filled 2019-07-06: qty 296

## 2019-07-06 MED ORDER — ONDANSETRON HCL 4 MG PO TABS: 4.0000 mg | ORAL_TABLET | Freq: Four times a day (QID) | ORAL | Status: DC | PRN

## 2019-07-06 MED ORDER — PROPOFOL 10 MG/ML IV BOLUS
INTRAVENOUS | Status: DC | PRN
  Administered 2019-07-06: 120 mg via INTRAVENOUS

## 2019-07-06 MED ORDER — 0.9 % SODIUM CHLORIDE (POUR BTL) OPTIME
TOPICAL | Status: DC | PRN
  Administered 2019-07-06 (×2): 1000 mL

## 2019-07-06 MED ORDER — LACTATED RINGERS IV SOLN
INTRAVENOUS | Status: DC | PRN
  Administered 2019-07-06 (×2): via INTRAVENOUS

## 2019-07-06 MED ORDER — CEFAZOLIN SODIUM-DEXTROSE 2-4 GM/100ML-% IV SOLN
2.0000 g | INTRAVENOUS | Status: AC
  Administered 2019-07-06: 18:00:00 2 g via INTRAVENOUS

## 2019-07-06 MED ORDER — BUPIVACAINE HCL (PF) 0.5 % IJ SOLN
INTRAMUSCULAR | Status: AC
Start: 2019-07-06 — End: ?
  Filled 2019-07-06: qty 30

## 2019-07-06 MED ORDER — FENTANYL CITRATE (PF) 100 MCG/2ML IJ SOLN
INTRAMUSCULAR | Status: AC
  Filled 2019-07-06: qty 2

## 2019-07-06 MED ORDER — FENTANYL CITRATE (PF) 250 MCG/5ML IJ SOLN
INTRAMUSCULAR | Status: DC | PRN
  Administered 2019-07-06 (×5): 50 ug via INTRAVENOUS

## 2019-07-06 MED ORDER — TRANEXAMIC ACID-NACL 1000-0.7 MG/100ML-% IV SOLN
INTRAVENOUS | Status: AC
Start: 2019-07-06 — End: 2019-07-07
  Filled 2019-07-06: qty 100

## 2019-07-06 MED ORDER — BUPIVACAINE HCL 0.25 % IJ SOLN
INTRAMUSCULAR | Status: DC | PRN
  Administered 2019-07-06: 12 mL
  Administered 2019-07-06: 10 mL

## 2019-07-06 MED ORDER — ONDANSETRON HCL 4 MG/2ML IJ SOLN: 4.0000 mg | Freq: Four times a day (QID) | INTRAMUSCULAR | Status: DC | PRN

## 2019-07-06 MED ORDER — SUCCINYLCHOLINE CHLORIDE 200 MG/10ML IV SOSY
PREFILLED_SYRINGE | INTRAVENOUS | Status: DC | PRN
  Administered 2019-07-06: 100 mg via INTRAVENOUS

## 2019-07-06 MED ORDER — TRANEXAMIC ACID-NACL 1000-0.7 MG/100ML-% IV SOLN
INTRAVENOUS | Status: AC
  Filled 2019-07-06: qty 100

## 2019-07-06 MED ORDER — FENTANYL CITRATE (PF) 100 MCG/2ML IJ SOLN: 25.0000 ug | INTRAMUSCULAR | Status: DC | PRN

## 2019-07-06 MED ORDER — DEXTROSE-NACL 5-0.9 % IV SOLN
INTRAVENOUS | Status: AC
  Administered 2019-07-06: 05:00:00 via INTRAVENOUS

## 2019-07-06 MED ORDER — TRANEXAMIC ACID-NACL 1000-0.7 MG/100ML-% IV SOLN
1000.0000 mg | INTRAVENOUS | Status: AC
  Administered 2019-07-06: 19:00:00 1000 mg via INTRAVENOUS

## 2019-07-06 MED ORDER — DOCUSATE SODIUM 100 MG PO CAPS
100.0000 mg | ORAL_CAPSULE | Freq: Two times a day (BID) | ORAL | Status: DC
  Administered 2019-07-07 – 2019-07-12 (×12): 100 mg via ORAL
  Filled 2019-07-06 (×14): qty 1

## 2019-07-06 MED ORDER — SODIUM CHLORIDE 0.9 % IR SOLN
Status: DC | PRN
  Administered 2019-07-06: 1000 mL

## 2019-07-06 MED ORDER — CHLORHEXIDINE GLUCONATE 4 % EX LIQD: 60.0000 mL | Freq: Once | CUTANEOUS | Status: DC

## 2019-07-06 MED ORDER — SODIUM CHLORIDE 0.9 % IV SOLN
INTRAVENOUS | Status: DC | PRN
  Administered 2019-07-06: 50 ug/min via INTRAVENOUS

## 2019-07-06 MED ORDER — POVIDONE-IODINE 10 % EX SWAB: 2.0000 "application " | Freq: Once | CUTANEOUS | Status: DC

## 2019-07-06 MED ORDER — PHENYLEPHRINE 40 MCG/ML (10ML) SYRINGE FOR IV PUSH (FOR BLOOD PRESSURE SUPPORT)
PREFILLED_SYRINGE | INTRAVENOUS | Status: DC | PRN
  Administered 2019-07-06: 120 ug via INTRAVENOUS
  Administered 2019-07-06: 200 ug via INTRAVENOUS
  Administered 2019-07-06: 80 ug via INTRAVENOUS
  Administered 2019-07-06: 200 ug via INTRAVENOUS

## 2019-07-06 MED ORDER — PHENYLEPHRINE HCL (PRESSORS) 10 MG/ML IV SOLN
INTRAVENOUS | Status: AC
  Filled 2019-07-06: qty 1

## 2019-07-06 SURGICAL SUPPLY — 94 items
APL PRP STRL LF DISP 70% ISPRP (MISCELLANEOUS) ×2
BIPOLAR PROS AML 41 (Hips) ×3 IMPLANT
BIPOLAR PROS AML 41MM (Hips) ×1 IMPLANT
BIT DRILL CANN LG 4.3MM (BIT) IMPLANT
BLADE SAW SAG 73X25 THK (BLADE) ×1
BLADE SAW SGTL 73X25 THK (BLADE) ×3 IMPLANT
BNDG COHESIVE 6X5 TAN STRL LF (GAUZE/BANDAGES/DRESSINGS) ×4 IMPLANT
BOWL SMART MIX CTS (DISPOSABLE) ×2 IMPLANT
BRUSH FEMORAL CANAL (MISCELLANEOUS) IMPLANT
CELLS DAT CNTRL 66122 CELL SVR (MISCELLANEOUS) ×2 IMPLANT
CEMENT BONE SIMPLEX SPEEDSET (Cement) ×2 IMPLANT
CHLORAPREP W/TINT 26 (MISCELLANEOUS) ×4 IMPLANT
COVER PERINEAL POST (MISCELLANEOUS) ×4 IMPLANT
COVER SURGICAL LIGHT HANDLE (MISCELLANEOUS) ×4 IMPLANT
COVER WAND RF STERILE (DRAPES) IMPLANT
DRAPE C-ARM 42X120 X-RAY (DRAPES) ×4 IMPLANT
DRAPE C-ARMOR (DRAPES) ×4 IMPLANT
DRAPE INCISE IOBAN 66X45 STRL (DRAPES) ×4 IMPLANT
DRAPE ORTHO SPLIT 77X108 STRL (DRAPES) ×8
DRAPE POUCH INSTRU U-SHP 10X18 (DRAPES) ×4 IMPLANT
DRAPE STERI IOBAN 125X83 (DRAPES) ×4 IMPLANT
DRAPE SURG ORHT 6 SPLT 77X108 (DRAPES) ×4 IMPLANT
DRAPE U-SHAPE 47X51 STRL (DRAPES) ×4 IMPLANT
DRAPE WARM FLUID 44X44 (DRAPES) ×4 IMPLANT
DRILL BIT CANN LG 4.3MM (BIT) ×4
DRSG PAD ABDOMINAL 8X10 ST (GAUZE/BANDAGES/DRESSINGS) ×4 IMPLANT
ELECT BLADE TIP CTD 4 INCH (ELECTRODE) ×4 IMPLANT
ELECT PENCIL ROCKER SW 15FT (MISCELLANEOUS) ×2 IMPLANT
ELECT REM PT RETURN 15FT ADLT (MISCELLANEOUS) ×4 IMPLANT
EVACUATOR 1/8 PVC DRAIN (DRAIN) ×4 IMPLANT
FACESHIELD WRAPAROUND (MASK) ×20 IMPLANT
FACESHIELD WRAPAROUND OR TEAM (MASK) ×10 IMPLANT
GAUZE SPONGE 4X4 12PLY STRL (GAUZE/BANDAGES/DRESSINGS) ×8 IMPLANT
GAUZE XEROFORM 5X9 LF (GAUZE/BANDAGES/DRESSINGS) ×4 IMPLANT
GLOVE BIOGEL PI IND STRL 8 (GLOVE) ×2 IMPLANT
GLOVE BIOGEL PI INDICATOR 8 (GLOVE) ×2
GLOVE INDICATOR 8.0 STRL GRN (GLOVE) ×4 IMPLANT
GLOVE ORTHO TXT STRL SZ7.5 (GLOVE) ×8 IMPLANT
GOWN STRL REUS W/TWL LRG LVL3 (GOWN DISPOSABLE) ×4 IMPLANT
GOWN STRL REUS W/TWL XL LVL3 (GOWN DISPOSABLE) ×10 IMPLANT
GUIDEPIN 3.2X17.5 THRD DISP (PIN) ×2 IMPLANT
GUIDEWIRE BALL NOSE 80CM (WIRE) ×2 IMPLANT
HANDPIECE INTERPULSE COAX TIP (DISPOSABLE) ×4
HEAD BIPOLAR PROS AML 41 (Hips) IMPLANT
HEAD FEM STD 28X+1.5 STRL (Hips) ×2 IMPLANT
HIP FRA NAIL LAG SCREW 10.5X90 (Orthopedic Implant) ×4 IMPLANT
IMMOBILIZER KNEE 20 (SOFTGOODS) ×4 IMPLANT
IMMOBILIZER KNEE 20 THIGH 36 (SOFTGOODS) ×2 IMPLANT
KIT BASIN OR (CUSTOM PROCEDURE TRAY) ×4 IMPLANT
KIT TURNOVER KIT A (KITS) IMPLANT
MANIFOLD NEPTUNE II (INSTRUMENTS) ×4 IMPLANT
NAIL HIP FRACT 130D 9X180 (Orthopedic Implant) ×2 IMPLANT
NDL MAYO CATGUT SZ4 TPR NDL (NEEDLE) ×2 IMPLANT
NEEDLE HYPO 22GX1.5 SAFETY (NEEDLE) ×4 IMPLANT
NEEDLE MAYO CATGUT SZ4 (NEEDLE) ×4 IMPLANT
NS IRRIG 1000ML POUR BTL (IV SOLUTION) ×8 IMPLANT
PACK GENERAL/GYN (CUSTOM PROCEDURE TRAY) ×4 IMPLANT
PACK TOTAL JOINT (CUSTOM PROCEDURE TRAY) ×4 IMPLANT
PAD CAST 4YDX4 CTTN HI CHSV (CAST SUPPLIES) ×2 IMPLANT
PADDING CAST COTTON 4X4 STRL (CAST SUPPLIES) ×4
PASSER SUT SWANSON 36MM LOOP (INSTRUMENTS) ×4 IMPLANT
PROTECTOR NERVE ULNAR (MISCELLANEOUS) ×8 IMPLANT
RETRACTOR WND ALEXIS 18 MED (MISCELLANEOUS) IMPLANT
RTRCTR WOUND ALEXIS 18CM MED (MISCELLANEOUS) ×4
SCREW BONE CORTICAL 5.0X32 (Screw) ×2 IMPLANT
SCREW LAG HIP FRA NAIL 10.5X90 (Orthopedic Implant) IMPLANT
SET HNDPC FAN SPRY TIP SCT (DISPOSABLE) IMPLANT
SPONGE LAP 18X18 RF (DISPOSABLE) ×2 IMPLANT
STAPLER VISISTAT 35W (STAPLE) ×4 IMPLANT
STEM CORAIL KA10 (Stem) ×2 IMPLANT
SUCTION FRAZIER HANDLE 12FR (TUBING) ×2
SUCTION TUBE FRAZIER 12FR DISP (TUBING) ×2 IMPLANT
SUT ETHIBOND NAB CT1 #1 30IN (SUTURE) ×16 IMPLANT
SUT VIC AB 0 CT1 27 (SUTURE) ×4
SUT VIC AB 0 CT1 27XBRD ANTBC (SUTURE) ×2 IMPLANT
SUT VIC AB 1 CT1 27 (SUTURE)
SUT VIC AB 1 CT1 27XBRD ANTBC (SUTURE) IMPLANT
SUT VIC AB 1 CT1 36 (SUTURE) ×8 IMPLANT
SUT VIC AB 1 CTX 36 (SUTURE)
SUT VIC AB 1 CTX36XBRD ANBCTR (SUTURE) IMPLANT
SUT VIC AB 2-0 CT1 27 (SUTURE) ×4
SUT VIC AB 2-0 CT1 36 (SUTURE) ×12 IMPLANT
SUT VIC AB 2-0 CT1 TAPERPNT 27 (SUTURE) ×2 IMPLANT
SUT VLOC 180 0 9IN  GS21 (SUTURE) ×2
SUT VLOC 180 0 9IN GS21 (SUTURE) IMPLANT
SYR 20ML LL LF (SYRINGE) ×4 IMPLANT
SYR 30ML LL (SYRINGE) IMPLANT
SYR BULB IRRIGATION 50ML (SYRINGE) ×2 IMPLANT
SYR CONTROL 10ML LL (SYRINGE) ×2 IMPLANT
TAPE CLOTH 4X10 WHT NS (GAUZE/BANDAGES/DRESSINGS) ×4 IMPLANT
TOWEL OR 17X26 10 PK STRL BLUE (TOWEL DISPOSABLE) ×8 IMPLANT
TOWER CARTRIDGE SMART MIX (DISPOSABLE) IMPLANT
TRAY FOLEY MTR SLVR 16FR STAT (SET/KITS/TRAYS/PACK) ×4 IMPLANT
WATER STERILE IRR 1000ML POUR (IV SOLUTION) ×4 IMPLANT

## 2019-07-06 NOTE — Anesthesia Procedure Notes (Signed)
Procedure Name: Intubation Date/Time: 07/06/2019 6:14 PM Performed by: Lollie Sails, CRNA Pre-anesthesia Checklist: Patient identified, Emergency Drugs available, Suction available, Patient being monitored and Timeout performed Patient Re-evaluated:Patient Re-evaluated prior to induction Oxygen Delivery Method: Circle system utilized Preoxygenation: Pre-oxygenation with 100% oxygen Induction Type: IV induction Ventilation: Mask ventilation without difficulty Laryngoscope Size: Glidescope and 3 Grade View: Grade I Tube type: Oral Tube size: 7.5 mm Number of attempts: 1 Airway Equipment and Method: Stylet Placement Confirmation: ETT inserted through vocal cords under direct vision,  positive ETCO2 and breath sounds checked- equal and bilateral Secured at: 23 cm Tube secured with: Tape Dental Injury: Teeth and Oropharynx as per pre-operative assessment

## 2019-07-06 NOTE — Progress Notes (Signed)
Md notified via amion of pt urinary retention. rn awaiting a call back or order to be placed.

## 2019-07-06 NOTE — Progress Notes (Signed)
Pt off the unit to OR for surgery

## 2019-07-06 NOTE — Anesthesia Procedure Notes (Signed)
Date/Time: 07/06/2019 9:33 PM Performed by: Cynda Familia, CRNA Oxygen Delivery Method: Simple face mask Placement Confirmation: positive ETCO2 and breath sounds checked- equal and bilateral Dental Injury: Teeth and Oropharynx as per pre-operative assessment

## 2019-07-06 NOTE — Interval H&P Note (Signed)
History and Physical Interval Note:  07/06/2019 6:03 PM  Brian Stanton  has presented today for surgery, with the diagnosis of LEFT INTERTROCHANTERIC FRACTURE RIGHT FEMORAL NECK FRACTURE.  The various methods of treatment have been discussed with the patient and family. After consideration of risks, benefits and other options for treatment, the patient has consented to  Procedure(s): INTRAMEDULLARY (IM) NAIL INTERTROCHANTRIC (Left) ARTHROPLASTY BIPOLAR HIP (HEMIARTHROPLASTY) (Right) as a surgical intervention.  The patient's history has been reviewed, patient examined, no change in status, stable for surgery.  I have reviewed the patient's chart and labs.  Questions were answered to the patient's satisfaction.     Marybelle Killings

## 2019-07-06 NOTE — Progress Notes (Signed)
PT WITH BLADDER SCAN SHOWING 542, PT TO ALSO HAVE SURGERY TODAY FOR BILAT HIP FRACTURES AND WILL BE IMMOBILIZED FOR SEVERAL DAYS.

## 2019-07-06 NOTE — Transfer of Care (Signed)
Immediate Anesthesia Transfer of Care Note  Patient: Brian Stanton  Procedure(s) Performed: INTRAMEDULLARY (IM) NAIL INTERTROCHANTRIC (Left ) ARTHROPLASTY BIPOLAR HIP (HEMIARTHROPLASTY) (Right Hip)  Patient Location: PACU  Anesthesia Type:General  Level of Consciousness: sedated  Airway & Oxygen Therapy: Patient Spontanous Breathing and Patient connected to face mask oxygen  Post-op Assessment: Report given to RN and Post -op Vital signs reviewed and stable  Post vital signs: Reviewed and stable  Last Vitals:  Vitals Value Taken Time  BP 102/53 07/06/19 2139  Temp    Pulse 88 07/06/19 2144  Resp 19 07/06/19 2144  SpO2 100 % 07/06/19 2144  Vitals shown include unvalidated device data.  Last Pain:  Vitals:   07/06/19 1331  TempSrc: Axillary  PainSc:          Complications: No apparent anesthesia complications

## 2019-07-06 NOTE — Progress Notes (Signed)
PROGRESS NOTE    Brian Stanton  ZOX:[id number] DOB: Feb 18, 1961 DOA: 07/05/2019 PCP: Default, Provider, MD   Brief Narrative:  HPI per Dr. Midge MiniumArshad Kakrakandy on 07/05/2019 Brian Stanton is a 58 y.o. male with history of Down syndrome, intellectual disability, dementia, hypothyroidism who as per the report he is usually ambulatory was found to be not wanting to walk this morning and was complaining of knee pain.  The exact site of the right knee pain was not sure.  Given the symptoms patient was brought to the ER.  Patient's mother was unable to provide any more history at this time.  ED Course: In the ER patient had CT scan of the head and neck and abdomen.  Shows bilateral hip fracture the right one looks chronic in the left on those acute.  On-call orthopedic surgeon Dr. Ophelia CharterYates was consulted and is planning to have surgery tomorrow.  CAT scan of the abdomen also shows features concerning for pneumonia.  And also shows chronic left UPJ obstruction and bladder wall thickening.  UA was unremarkable.  CT of the head and C-spine shows large fat-containing soft tissue in the posterior part of the neck which will need further work-up differentials include lipoma versus liposarcoma.  Patient admitted for further management of hip fracture.  **Interim History  Patient was little agitated and had to have a Foley catheter placed.  Still wearing safety mittens.  Patient is to go for surgical intervention later this afternoon.  Assessment & Plan:   Active Problems:   Closed intertrochanteric fracture of hip, left, initial encounter (HCC)   Closed displaced fracture of right femoral neck (HCC)   Hip fracture (HCC)   Hypothyroidism   Closed left hip fracture, initial encounter (HCC)  Bilateral hip fracture with Acute Left Inertrochanteric  -The left one appears to be acute as per the orthopedic surgeon and the right one looks chronic plan is to get surgery done on the left hip at this time. -Patient will be  kept n.p.o. this  the morning and anticipation of surgery.   -IV fluids as below with D5 normal saline at 50 mL's per hour  -Pain relief medications with IV morphine 0.5 mg every 2 as needed for severe pain and was given 1 dose of 4 mg once yesterday -Patient will be at moderate risk for intermediate risk procedure.  Suspected Pneumonia with Sepsis ruled out -Given the elevated lactate though patient is presently hemodynamically stable.  Did have an elevated procalcitonin level 5.18 -He was placed on the antibiotics with azithromycin and ceftriaxone as well as IV fluids with D5 normal saline at a rate of 50 mL's per hour for 24 hours -CT scan of the chest showed patchy airspace opacities in the medial basal segment of the right lower lobe which are new since prior study probably infectious versus inflammatory and questionable if patient has aspirated -Repeat chest x-ray in a.m. -Lactic acid level was elevated at 4.3 is now trended down to 1.8 -Procalcitonin level was 5.18 and not repeated this morning -WBC trending down from 13.8 and is now 11.8 -Does not appear to be septic as he is afebrile has no tachypnea or tachycardia,  and leukocytosis likely secondary to his hip fracture and trending down -Blood cultures x2 showed no growth to date less than 24 hours -Repeat CBC in the a.m. and repeat chest x-ray -Started the patient on guaifenesin 600 mg p.o. twice daily and will continue  Hypothyroidism  -TSH was 4.592 however free T4 was normal  at 1.10 -We will dose IV Synthroid 44 mcg for now until patient can take orally.  History of dementia and intellect disability with history of Down syndrome  -On Donepezil 5 mg po Daily, Trazodone 25 mg po qHS, and Memantine 28 mg po Daily.  Soft tissue swelling in the posterior aspect of the neck concerning for lipoma versus liposarcoma  -will need further work-up as outpatient.  Chronic left UPJ obstruction  -UA unremarkable.  -Urinalysis showed  clear appearance with yellow color urine and negative ketones negative leukocytes -CT Abdomen and Pelvis showed "Chronic left UPJ obstruction. Chronic diffuse thickening of the wall of the urinary bladder." -We will need to obtain a renal ultrasound today  Acute Urinary Retention  -Urinalysis was unremarkable and urine culture still pending -C/w Mirabergron ER 25 mg po q24h -Bladder scan showed 542 mL's and because patient was going for surgical intervention a Foley catheter was placed -We will need a renal ultrasound in 2 days and a trial of void once he is up and ambulatory after surgery  Elevated AST -Mild as AST was 45 -Continue to Monitor and Trend and repeat CMP in AM   DVT prophylaxis: SCDs with pharmacological prophylaxis per orthopedic surgery Code Status: FULL CODE Family Communication: No family present at bedside  Disposition Plan: Pending further surgical intervention and evaluation by PT and OT  Consultants:   Orthopedic Surgery Dr. [name]   Procedures: None   Antimicrobials:  Anti-infectives (From admission, onward)   Start     Dose/Rate Route Frequency Ordered Stop   07/05/19 2200  cefTRIAXone (ROCEPHIN) 2 g in sodium chloride 0.9 % 100 mL IVPB     2 g 200 mL/hr over 30 Minutes Intravenous Every 24 hours 07/05/19 2100 07/10/19 2159   07/05/19 2200  azithromycin (ZITHROMAX) 500 mg in sodium chloride 0.9 % 250 mL IVPB     500 mg 250 mL/hr over 60 Minutes Intravenous Every 24 hours 07/05/19 2100 07/10/19 2159     Subjective: Patient is a thin pleasantly demented Caucasian male with intellectual disability and Down syndrome in no acute distress who has wearing safety mittens and does not provide any subjective history.  Foley catheter has been placed by nursing staff and states that he was agitated this morning.  States the patient cries out in pain whenever his legs are attempted to be straightened.  Patient is to go for surgical intervention later this  afternoon.  Objective: Vitals:   07/05/19 1610 07/05/19 2022 07/05/19 2236 07/06/19 0625  BP: 109/68 140/72 122/73 104/66  Pulse: 89 84 87 87  Resp: 18 20 18 16   Temp: 99.5 F (37.5 C)  98 F (36.7 C) 98.3 F (36.8 C)  TempSrc: Rectal  Axillary Axillary  SpO2: 97% 98% 100% 97%    Intake/Output Summary (Last 24 hours) at 07/06/2019 0802 Last data filed at 07/06/2019 0610 Gross per 24 hour  Intake 860.71 ml  Output 1142 ml  Net -281.29 ml   There were no vitals filed for this visit.  Examination: Physical Exam:  Constitutional: Thin Caucasian Male with intellectual disability and Down syndrome inNAD and appears slightly agitated and uncomfortable Eyes:  Lids and conjunctivae normal, sclerae anicteric  ENMT: External Ears, Nose appear normal. Grossly normal hearing. Neck: Appears normal, supple, no cervical masses, normal ROM, no appreciable thyromegaly; no JVD Respiratory: Diminished to auscultation bilaterally, no wheezing, rales, rhonchi or crackles. Normal respiratory effort and patient is not tachypenic. No accessory muscle use.  Cardiovascular: RRR, no murmurs /  rubs / gallops. S1 and S2 auscultated. Abdomen: Soft, non-tender, non-distended. No masses palpated. No appreciable hepatosplenomegaly. Bowel sounds positive x4.  GU: Deferred. Foley catheter is now in place Musculoskeletal: Legs are bent and patient will not straighten them Skin: No rashes, lesions, ulcers on a limited skin evaluation. No induration; Warm and dry.  Neurologic: Does not follow commands and wearing safety mittens Psychiatric: Impaired judgment and insight. Awake.   Data Reviewed: I have personally reviewed following labs and imaging studies  CBC: Recent Labs  Lab 07/05/19 1353 07/06/19 0031  WBC 13.8* 11.8*  NEUTROABS 12.5*  --   HGB 15.2 13.7  HCT 46.0 41.6  MCV 103.8* 104.0*  PLT 270 382   Basic Metabolic Panel: Recent Labs  Lab 07/05/19 1353 07/06/19 0031  NA 140 140  K 3.6 4.5   CL 103 105  CO2 25 26  GLUCOSE 146* 143*  BUN 22* 21*  CREATININE 1.18 1.21  CALCIUM 8.9 8.6*   GFR: CrCl cannot be calculated (Unknown ideal weight.). Liver Function Tests: Recent Labs  Lab 07/05/19 1353  AST 45*  ALT 26  ALKPHOS 62  BILITOT 1.0  PROT 7.7  ALBUMIN 3.6   No results for input(s): LIPASE, AMYLASE in the last 168 hours. No results for input(s): AMMONIA in the last 168 hours. Coagulation Profile: No results for input(s): INR, PROTIME in the last 168 hours. Cardiac Enzymes: Recent Labs  Lab 07/05/19 1353  CKTOTAL 791*   BNP (last 3 results) No results for input(s): PROBNP in the last 8760 hours. HbA1C: No results for input(s): HGBA1C in the last 72 hours. CBG: Recent Labs  Lab 07/05/19 2133 07/05/19 2357 07/06/19 0627  GLUCAP 119* 132* 122*   Lipid Profile: No results for input(s): CHOL, HDL, LDLCALC, TRIG, CHOLHDL, LDLDIRECT in the last 72 hours. Thyroid Function Tests: Recent Labs    07/05/19 1342  TSH 4.592*  FREET4 1.10   Anemia Panel: No results for input(s): VITAMINB12, FOLATE, FERRITIN, TIBC, IRON, RETICCTPCT in the last 72 hours. Sepsis Labs: Recent Labs  Lab 07/05/19 1342 07/05/19 2133 07/06/19 0031  PROCALCITON  --  5.18  --   LATICACIDVEN 4.3* 3.2* 1.8    Recent Results (from the past 240 hour(s))  SARS Coronavirus 2 [facility] order, Performed in [facility] hospital lab) Nasopharyngeal Urine, Catheterized     Status: None   Collection Time: 07/05/19  1:41 PM   Specimen: Urine, Catheterized; Nasopharyngeal  Result Value Ref Range Status   SARS Coronavirus 2 NEGATIVE NEGATIVE Final    Comment: (NOTE) If result is NEGATIVE SARS-CoV-2 target nucleic acids are NOT DETECTED. The SARS-CoV-2 RNA is generally detectable in upper and lower  respiratory specimens during the acute phase of infection. The lowest  concentration of SARS-CoV-2 viral copies this assay can detect is 250  copies / mL. A negative result does not  preclude SARS-CoV-2 infection  and should not be used as the sole basis for treatment or other  patient management decisions.  A negative result may occur with  improper specimen collection / handling, submission of specimen other  than nasopharyngeal swab, presence of viral mutation(s) within the  areas targeted by this assay, and inadequate number of viral copies  (<250 copies / mL). A negative result must be combined with clinical  observations, patient history, and epidemiological information. If result is POSITIVE SARS-CoV-2 target nucleic acids are DETECTED. The SARS-CoV-2 RNA is generally detectable in upper and lower  respiratory specimens dur ing the acute phase of  infection.  Positive  results are indicative of active infection with SARS-CoV-2.  Clinical  correlation with patient history and other diagnostic information is  necessary to determine patient infection status.  Positive results do  not rule out bacterial infection or co-infection with other viruses. If result is PRESUMPTIVE POSTIVE SARS-CoV-2 nucleic acids MAY BE PRESENT.   A presumptive positive result was obtained on the submitted specimen  and confirmed on repeat testing.  While 2019 novel coronavirus  (SARS-CoV-2) nucleic acids may be present in the submitted sample  additional confirmatory testing may be necessary for epidemiological  and / or clinical management purposes  to differentiate between  SARS-CoV-2 and other Sarbecovirus currently known to infect humans.  If clinically indicated additional testing with an alternate test  methodology [phone]) is advised. The SARS-CoV-2 RNA is generally  detectable in upper and lower respiratory sp ecimens during the acute  phase of infection. The expected result is Negative. Fact Sheet for Patients:  BoilerBrush.com.cy Fact Sheet for Healthcare Providers: https://pope.com/ This test is not yet approved or cleared by  the [place] FDA and has been authorized for detection and/or diagnosis of SARS-CoV-2 by FDA under an Emergency Use Authorization (EUA).  This EUA will remain in effect (meaning this test can be used) for the duration of the COVID-19 declaration under Section 564(b)(1) of the Act, 21 U.S.C. section 360bbb-3(b)(1), unless the authorization is terminated or revoked sooner. Performed at [facility], [address], [place], [place] [address]   MRSA PCR Screening     Status: None   Collection Time: 07/06/19 12:04 AM   Specimen: Nasal Mucosa; Nasopharyngeal  Result Value Ref Range Status   MRSA by PCR NEGATIVE NEGATIVE Final    Comment:        The GeneXpert MRSA Assay (FDA approved for NASAL specimens only), is one component of a comprehensive MRSA colonization surveillance program. It is not intended to diagnose MRSA infection nor to guide or monitor treatment for MRSA infections. Performed at [facility], [address] [place], [place] [address]     Radiology Studies: Dg Chest 1 View  Result Date: 07/05/2019 CLINICAL DATA:  58 year old male with dementia and altered level of consciousness/activity level today. EXAM: CHEST  1 VIEW COMPARISON:  Prior chest x-ray 07/19/2009 FINDINGS: The lungs are clear and negative for focal airspace consolidation, pulmonary edema or suspicious pulmonary nodule. No pleural effusion or pneumothorax. Cardiac and mediastinal contours are within normal limits. Atherosclerotic calcifications visualized along the aorta. No acute fracture or lytic or blastic osseous lesions. The visualized upper abdominal bowel gas pattern is unremarkable. IMPRESSION: No active disease. Electronically Signed   By: Malachy Moan M.D.   On: 07/05/2019 15:28   Dg Knee 2 Views Left  Result Date: 07/05/2019 CLINICAL DATA:  Pain and redness. EXAM: LEFT KNEE - 1-2 VIEW COMPARISON:  No recent. FINDINGS: No acute bony or joint  abnormality identified. No evidence of fracture dislocation. IMPRESSION: No acute abnormality. Electronically Signed   By: Maisie Fus  Register   On: 07/05/2019 13:19   Ct Head Wo Contrast  Result Date: 07/05/2019 CLINICAL DATA:  Altered mental status.  No known injury EXAM: CT HEAD WITHOUT CONTRAST CT CERVICAL SPINE WITHOUT CONTRAST TECHNIQUE: Multidetector CT imaging of the head and cervical spine was performed following the standard protocol without intravenous contrast. Multiplanar CT image reconstructions of the cervical spine were also generated. COMPARISON:  None. FINDINGS: CT HEAD FINDINGS Brain: Motion degraded study.  Multiple repeats Moderate  atrophy without hydrocephalus. No acute infarct. Negative for acute hemorrhage or mass. Benign-appearing calcification basal ganglia bilaterally. Vascular: Negative for hyperdense vessel Skull: Negative Sinuses/Orbits: Mild mucosal edema left maxillary sinus likely odontogenic with periapical abscess left upper teeth. Other: None CT CERVICAL SPINE FINDINGS Alignment: 2 mm anterolisthesis C3-4 and C7-T1. Mild retrolisthesis C4-5 and C5-6. Mild anterolisthesis C6-7. Skull base and vertebrae: Negative for cervical spine fracture Soft tissues and spinal canal: Fat containing mass in the subcutaneous tissues of the posterior neck measures approximately 6.6 x 3.1 x 8.3 cm. There is stranding within the fatty mass as well as stranding in the surrounding tissues. Disc levels: Multilevel disc and facet degeneration throughout the cervical spine. Multilevel foraminal stenosis due to spurring. Upper chest: Negative Other: Motion degraded study.  Multiple repeat images. IMPRESSION: 1. Motion degraded CT head and cervical spine 2. No acute intracranial abnormality.  Moderate atrophy. 3. Cervical spondylosis.  Negative for fracture. 4. Large fat containing mass in the subcutaneous tissues posterior neck. This may represent a lipoma or liposarcoma. Physical examination and close  follow-up recommended. Electronically Signed   By: Marlan Palau M.D.   On: 07/05/2019 18:44   Ct Cervical Spine Wo Contrast  Result Date: 07/05/2019 CLINICAL DATA:  Altered mental status.  No known injury EXAM: CT HEAD WITHOUT CONTRAST CT CERVICAL SPINE WITHOUT CONTRAST TECHNIQUE: Multidetector CT imaging of the head and cervical spine was performed following the standard protocol without intravenous contrast. Multiplanar CT image reconstructions of the cervical spine were also generated. COMPARISON:  None. FINDINGS: CT HEAD FINDINGS Brain: Motion degraded study.  Multiple repeats Moderate atrophy without hydrocephalus. No acute infarct. Negative for acute hemorrhage or mass. Benign-appearing calcification basal ganglia bilaterally. Vascular: Negative for hyperdense vessel Skull: Negative Sinuses/Orbits: Mild mucosal edema left maxillary sinus likely odontogenic with periapical abscess left upper teeth. Other: None CT CERVICAL SPINE FINDINGS Alignment: 2 mm anterolisthesis C3-4 and C7-T1. Mild retrolisthesis C4-5 and C5-6. Mild anterolisthesis C6-7. Skull base and vertebrae: Negative for cervical spine fracture Soft tissues and spinal canal: Fat containing mass in the subcutaneous tissues of the posterior neck measures approximately 6.6 x 3.1 x 8.3 cm. There is stranding within the fatty mass as well as stranding in the surrounding tissues. Disc levels: Multilevel disc and facet degeneration throughout the cervical spine. Multilevel foraminal stenosis due to spurring. Upper chest: Negative Other: Motion degraded study.  Multiple repeat images. IMPRESSION: 1. Motion degraded CT head and cervical spine 2. No acute intracranial abnormality.  Moderate atrophy. 3. Cervical spondylosis.  Negative for fracture. 4. Large fat containing mass in the subcutaneous tissues posterior neck. This may represent a lipoma or liposarcoma. Physical examination and close follow-up recommended. Electronically Signed   By: Marlan Palau M.D.   On: 07/05/2019 18:44   Ct Abdomen Pelvis W Contrast  Result Date: 07/05/2019 CLINICAL DATA:  had a BM in bed. According to nursing home personnel he usually walks and moves around as he wants to but today is not wanting to walk or stand. He is guarding his knee (side is questionable). No reported mechanism of injury, fall, or trauma. Patient has a history of down syndrome and is non verbal and combative. EXAM: CT ABDOMEN AND PELVIS WITH CONTRAST TECHNIQUE: Multidetector CT imaging of the abdomen and pelvis was performed using the standard protocol following bolus administration of intravenous contrast. CONTRAST:  OMNIPAQUE IOHEXOL 300 MG/ML  SOLN COMPARISON:  01/16/2012 FINDINGS: Lower chest: No pleural or pericardial effusion. Patchy airspace opacities in the medial  basal segment right lower lobe, new since previous. Hepatobiliary: No focal liver abnormality is seen. No gallstones, gallbladder wall thickening, or biliary dilatation. Pancreas: Unremarkable. No pancreatic ductal dilatation or surrounding inflammatory changes. Spleen: Normal in size. Scattered punctate calcified granulomas. Adrenals/Urinary Tract: Normal adrenals. Right kidney unremarkable. Chronic left UPJ obstruction. No renal mass. Urinary bladder incompletely distended, thick-walled. Stomach/Bowel: Small hiatal hernia. Stomach is nondilated. Small bowel decompressed. Normal appendix. The colon is nondilated. Scattered sigmoid diverticula without significant adjacent inflammatory/edematous change or abscess. Moderate rectal fecal material. Vascular/Lymphatic: No significant vascular findings are present. No enlarged abdominal or pelvic lymph nodes. Reproductive: Prostate normal in size with central coarse calcifications. Other: No ascites. No free air. Musculoskeletal: Comminuted left intertrochanteric femur fracture with mild varus deformity. Regional soft tissue swelling. Chronic appearing subcapital or midcervical right  femur fracture, new since 01/16/2012, with resorption along the fracture lines. Bony pelvis intact. Degenerative disc disease L1-2. 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 IMPRESSION: 1. Comminuted left intertrochanteric femur fracture with mild varus deformity. 2. 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 3. Patchy airspace opacities in the medial basal segment right lower lobe, new since previous study, possibly infectious/inflammatory. 4.  Sigmoid diverticulosis. 5. Small hiatal hernia. 6. Chronic left UPJ obstruction. 7. Chronic diffuse thickening of the wall of the urinary bladder. Electronically Signed   By: Corlis Leak M.D.   On: 07/05/2019 18:31   Dg Knee Complete 4 Views Right  Result Date: 07/05/2019 CLINICAL DATA:  Right knee pain EXAM: RIGHT KNEE - COMPLETE 4+ VIEW COMPARISON:  None. FINDINGS: No evidence of fracture, dislocation, or joint effusion. No evidence of arthropathy or other focal bone abnormality. Soft tissues are unremarkable. IMPRESSION: Negative. Electronically Signed   By: Elige Ko   On: 07/05/2019 15:24   Dg Hips Bilat W Or Wo Pelvis 3-4 Views  Result Date: 07/05/2019 CLINICAL DATA:  Patient unable to walk today. EXAM: DG HIP (WITH OR WITHOUT PELVIS) 3-4V BILAT COMPARISON:  None. FINDINGS: Displaced right subcapital femoral neck fracture. Slight sclerosis along the fracture cleft which may reflect a subacute injury. Comminuted left intertrochanteric fracture with apex lateral angulation. No other acute fracture or dislocation. No aggressive osseous lesion. IMPRESSION: Displaced right subcapital femoral neck fracture. Slight sclerosis along the fracture cleft which may reflect a subacute injury. Comminuted left intertrochanteric fracture with apex lateral angulation. Electronically Signed   By: Elige Ko   On: 07/05/2019 15:26   Scheduled Meds:  docusate sodium  100 mg Oral BID   donepezil  5 mg Oral Daily    guaiFENesin  600 mg Oral BID   levothyroxine  44 mcg Intravenous Daily   lubiprostone  24 mcg Oral BID WC   Melatonin  3 mg Oral QHS   memantine  28 mg Oral Daily   mirabegron ER  25 mg Oral Q24H   traZODone  25 mg Oral QHS   Continuous Infusions:  azithromycin Stopped (07/06/19 0240)   cefTRIAXone (ROCEPHIN)  IV 25 mL/hr at 07/06/19 0610   dextrose 5 % and 0.9% NaCl 50 mL/hr at 07/06/19 0610    LOS: 1 day   Merlene Laughter, DO [facility] PAGER is on AMION  If 7PM-7AM, please contact night-coverage [email or web address] Password [email or web address] 07/06/2019, 8:02 AM

## 2019-07-06 NOTE — Progress Notes (Signed)
Initial Nutrition Assessment  RD working remotely.   DOCUMENTATION CODES:   Not applicable  INTERVENTION:  - weigh patient and obtain height today--no height or weight in chart so unable to appropriately estimate needs; these values are also needed for planned surgery.  - will assess for nutrition needs following surgery.    NUTRITION DIAGNOSIS:   Increased nutrient needs related to post-op healing as evidenced by estimated needs.  GOAL:   Patient will meet greater than or equal to 90% of their needs  MONITOR:   Diet advancement, Labs, Weight trends  REASON FOR ASSESSMENT:   Consult Hip fracture protocol  ASSESSMENT:   58 y.o. male with history of Down syndrome, intellectual disability, dementia, hypothyroidism. It is reported that patient is usually ambulatory. He was found not walking and complaining of right knee pain on the morning of 9/22. CT showed bilateral hip fracture with R side appearing chronic and L side appearing acute. Ortho called and plan for surgery 9/23. CT indicated PNA, chronic left UPJ obstruction, and bladder wall thickening. CT head and cervical spine showed fat-containing soft tissue which will need additional work-up for lipoma vs liposarcoma.  Patient is NPO pending bilateral hip surgery today. Unable to obtain nutrition-related information as flow sheet indicates that patient is confused. No height or weight in the chart.   Per notes: - bilateral hip fractures with plan for surgery 9/23 - possible PNA with possible sepsis - hx of dementia and intellectual disability - soft tissue swelling in posterior region of neck--concern for lipoma vs liposarcoma; will need outpatient work-up - chronic left UPJ obstruction--UA unremarkable   Labs reviewed; CBGs: 122 and 143 mg/dl, BUN: 21 mg/dl, Ca: 8.6 mg/dl. Medications reviewed; 100 mg colace BID, 44 mcg IV synthroid/day, 3 g melatonin/night.  IVF; D5-NS @ 50 ml/hr (204 kcal).     NUTRITION -  FOCUSED PHYSICAL EXAM:  unable to complete at this time.   Diet Order:   Diet Order            Diet NPO time specified  Diet effective now              EDUCATION NEEDS:   Not appropriate for education at this time  Skin:  Skin Assessment: Reviewed RN Assessment  Last BM:  PTA/unknown  Height:   Ht Readings from Last 1 Encounters:  No data found for Ht    Weight:   Wt Readings from Last 1 Encounters:  No data found for Wt    Ideal Body Weight:     BMI:  There is no height or weight on file to calculate BMI.  Estimated Nutritional Needs:   Kcal:  1900-2100 kcal  Protein:  90-105 grams  Fluid:  >/= 1.8 L/day      Jarome Matin, MS, RD, LDN, [facility] Inpatient Clinical Dietitian Pager # [phone] After hours/weekend pager # [phone]

## 2019-07-06 NOTE — Op Note (Signed)
Preop diagnosis: Bilateral hip fractures, closed.  Left comminuted intertrochanteric hip fracture.  Right femoral neck fracture.  Postop diagnosis: Same  Procedure: Left Biomet affixes 9 mm short trochanteric nail with interlock.  95 mm lag screw.  Right hip bipolar hemiarthroplasty, cemented stem.  43 mm ball. Depuy  Surgeon: Rodell Perna, MD  Anesthesia: General oral tracheal +25 cc Marcaine local.   EBL: 650 mL's total for both procedures. Brief history: 58 year old male with Down's syndrome and dementia, Down syndrome, lives in a group home.  Did not want to get out of bed and would not ambulate and was brought to the hospital was found to have bilateral hip fractures.  There was no reported history of a fall.  Procedure: After induction of general anesthesia Unna boot application was performed patient was placed on the Hana table right leg was placed in the well leg holder.  Slight traction and internal rotation reduction and C-arm visualization for the left hip was performed.  Once this was confirmed with satisfactory alignment hip was prepped with DuraPrep the usual split sheets drapes large sharp curtain drape was applied timeout procedure completed.  Ancef was given prophylactically as well as TXA.  Incision was made proximal trochanter gluteus medius was split.  Tip the trochanter was palpated trochanter was comminuted was posteriorly positioned in the tip the trochanter was used to start but as initial reaming started and the guidewire was tried to be passed it went into slip more lateral between fragments of the trochanter.  Due to the patient's extremely small canal with short stature Down syndrome we reamed 9 mm and 10 mm and then placed a 9 mm nail that was progressed down.  Patient had osteopenia difficult visualizing the bone with C arm.  Once the nail was advanced appropriately K wire was drilled up across the neck into the middle of the head.  A Bed to be used underneath the neck to  push it up and also a bone hook to pull the trochanteric anteriorly.  Once pin was in good position of the head it was overreamed measured screw was placed locked down from top and then a distal interlock was used using the guide with  34 mm distal interlock side arm was removed AP lateral left fluoroscopic pictures were taken.  There is satisfactory alignment of the comminuted fracture and good fill of the canal with the 9 mm nail.  Standard layered closure with #1 Vicryl in the gluteus medius fascia 2 and subtenons tissue skin staple closure postop dressing with 4 x 4's ABDs and tape.  Next opposite leg attachment Hana leg holder was placed.  Standard positioning for right total hip arthroplasty anterior approach was performed C arm was brought in.  Patient had a transverse fracture of the neck.  This had the appearance of a subacute fracture by radiographs.  Standard prepping draping large sharp curtain Betadine Steri-Drape and straight drape above and across was performed to repeat timeout procedure.  Direct anterior incision was made starting just inferior and posterior to the ASIS obliquely to the trochanter fascia was elevated dull cobra was placed over the anterior aspect of the capsule capsule was debrided immediately old dark blood was noted at the fracture site.  Trochanter was partially broken with anterior portion of the trochanter mobile in the posterior still attached.  Saw was used to cut the neck under C-arm visualization.  Head was removed with a corkscrew.  It was measured and was only 59mm.  Preparation  of the canal was performed drug hook was placed leg was taken down and under Mueller retractor placed medially.  There was difficulty access to the canal since patient probably had a hip fracture with shortening for many weeks possibly months.  Leg was brought up additional mobilization and posterior aspect of the capsule was debrided some more.  Finally we had access to the canal.  It appeared  that a Corail stem would fit better and the [place] basic broach was too long to get access to the canal to the patient's small size.  The Corail broaches were used and we progressed up to a size stick 6 stem.  Do the patient small size even using small Unna boot also using yellow sock patient's foot started slipping out of the [employer] and had to be repositioned.  With the Mueller medially it would tend to digging into the bone and patient's bone is very brittle.  With the trochanter tract we did not have good rotational stability and growing larger size and stem would not improve this since it was already filled anterior to posterior and larger side would likely split the femur cortex.  Some cement was mixed placed around the stem it was placed in good position held with appropriate version for 15 minutes of the cement was hard.  Next we tried the shortest neck length with a 43 mm ball which was the size of the patient's head that been removed.  We tried this with a trial with a very extreme difficult reduction.  Hip was dislocated carefully and permanent ball was inserted shortest neck length 43 mm ball.  Hip was reduced had excellent stability checked AP and lateral stem was directly down gave good fit and fill of the canal and with some cement there was good rotational stability of the stem.  Copious irrigation was performed.  V lock closure of fascia 2 and subtenons tissue skin staple closure and thick postop dressing since patient pulls on dressings.

## 2019-07-06 NOTE — Anesthesia Preprocedure Evaluation (Addendum)
Anesthesia Evaluation  Patient identified by MRN, date of birth, ID band Patient awake  General Assessment Comment:Patient awake and alert, but non-verbal.  Reviewed: Allergy & Precautions, NPO status , Patient's Chart, lab work & pertinent test results  Airway Mallampati: II  TM Distance: >3 FB Neck ROM: Full    Dental  (+) Teeth Intact, Dental Advisory Given   Pulmonary neg pulmonary ROS,    Pulmonary exam normal breath sounds clear to auscultation       Cardiovascular negative cardio ROS Normal cardiovascular exam Rhythm:Regular Rate:Normal     Neuro/Psych PSYCHIATRIC DISORDERS (Intellectual disability) Dementia Down's syndrome    GI/Hepatic Neg liver ROS, GERD  ,  Endo/Other  Hypothyroidism   Renal/GU negative Renal ROS     Musculoskeletal -LEFT INTERTROCHANTERIC FRACTURE  -RIGHT FEMORAL NECK FRACTURELEFT    Abdominal   Peds  (+) mental retardation Hematology negative hematology ROS (+)   Anesthesia Other Findings Day of surgery medications reviewed with the patient.  Reproductive/Obstetrics                            Anesthesia Physical Anesthesia Plan  ASA: III  Anesthesia Plan: General   Post-op Pain Management:    Induction: Intravenous  PONV Risk Score and Plan: 3 and Dexamethasone and Ondansetron  Airway Management Planned: Oral ETT  Additional Equipment:   Intra-op Plan:   Post-operative Plan: Extubation in OR  Informed Consent: I have reviewed the patients History and Physical, chart, labs and discussed the procedure including the risks, benefits and alternatives for the proposed anesthesia with the patient or authorized representative who has indicated his/her understanding and acceptance.     Dental advisory given  Plan Discussed with: CRNA  Anesthesia Plan Comments:         Anesthesia Quick Evaluation

## 2019-07-06 NOTE — Progress Notes (Signed)
CSW received call from patient's sister Darnel Mchan asking for an update on patient's care and if he has gotten surgery. This CSW explained that I cover the ED and explained that patient is now on the medical floor for care. Of note, it does not appear patient had gotten surgery yet and relied this to [place]. CSW let Vaughan Basta know that we will send her information to the CSW and RN assigned to patient so they can reach out to her when there is more of an update.   Vaughan Basta made a point that patient responds to his middle name "[name] or Scottie" and it is helpful to use this name to calm him down.    [place]en Circle, LCSW Transitions of Care Department [facility] ED [phone]

## 2019-07-07 DIAGNOSIS — D696 Thrombocytopenia, unspecified: Secondary | ICD-10-CM

## 2019-07-07 DIAGNOSIS — D62 Acute posthemorrhagic anemia: Secondary | ICD-10-CM

## 2019-07-07 LAB — GLUCOSE, CAPILLARY
Glucose-Capillary: 101 mg/dL — ABNORMAL HIGH (ref 70–99)
Glucose-Capillary: 114 mg/dL — ABNORMAL HIGH (ref 70–99)
Glucose-Capillary: 119 mg/dL — ABNORMAL HIGH (ref 70–99)
Glucose-Capillary: 129 mg/dL — ABNORMAL HIGH (ref 70–99)
Glucose-Capillary: 178 mg/dL — ABNORMAL HIGH (ref 70–99)

## 2019-07-07 LAB — COMPREHENSIVE METABOLIC PANEL
ALT: 18 U/L (ref 0–44)
AST: 26 U/L (ref 15–41)
Albumin: 2.8 g/dL — ABNORMAL LOW (ref 3.5–5.0)
Alkaline Phosphatase: 37 U/L — ABNORMAL LOW (ref 38–126)
Anion gap: 6 (ref 5–15)
BUN: 17 mg/dL (ref 6–20)
CO2: 25 mmol/L (ref 22–32)
Calcium: 7.8 mg/dL — ABNORMAL LOW (ref 8.9–10.3)
Chloride: 110 mmol/L (ref 98–111)
Creatinine, Ser: 1.08 mg/dL (ref 0.61–1.24)
GFR calc Af Amer: >60 mL/min (ref >60)
GFR calc non Af Amer: >60 mL/min (ref >60)
Glucose, Bld: 140 mg/dL — ABNORMAL HIGH (ref 70–99)
Potassium: 3.8 mmol/L (ref 3.5–5.1)
Sodium: 141 mmol/L (ref 135–145)
Total Bilirubin: 0.7 mg/dL (ref 0.3–1.2)
Total Protein: 5.4 g/dL — ABNORMAL LOW (ref 6.5–8.1)

## 2019-07-07 LAB — CBC WITH DIFFERENTIAL/PLATELET
Abs Immature Granulocytes: 0.04 10*3/uL (ref 0.00–0.07)
Basophils Absolute: 0 10*3/uL (ref 0.0–0.1)
Basophils Relative: 0 %
Eosinophils Absolute: 0 10*3/uL (ref 0.0–0.5)
Eosinophils Relative: 0 %
HCT: 26 % — ABNORMAL LOW (ref 39.0–52.0)
Hemoglobin: 8.5 g/dL — ABNORMAL LOW (ref 13.0–17.0)
Immature Granulocytes: 0 %
Lymphocytes Relative: 5 %
Lymphs Abs: 0.5 10*3/uL — ABNORMAL LOW (ref 0.7–4.0)
MCH: 34.8 pg — ABNORMAL HIGH (ref 26.0–34.0)
MCHC: 32.7 g/dL (ref 30.0–36.0)
MCV: 106.6 fL — ABNORMAL HIGH (ref 80.0–100.0)
Monocytes Absolute: 0.6 10*3/uL (ref 0.1–1.0)
Monocytes Relative: 7 %
Neutro Abs: 8.2 10*3/uL — ABNORMAL HIGH (ref 1.7–7.7)
Neutrophils Relative %: 88 %
Platelets: 143 10*3/uL — ABNORMAL LOW (ref 150–400)
RBC: 2.44 MIL/uL — ABNORMAL LOW (ref 4.22–5.81)
RDW: 13.9 % (ref 11.5–15.5)
WBC: 9.4 10*3/uL (ref 4.0–10.5)
nRBC: 0 % (ref 0.0–0.2)

## 2019-07-07 LAB — PHOSPHORUS: Phosphorus: 1.9 mg/dL — ABNORMAL LOW (ref 2.5–4.6)

## 2019-07-07 LAB — HEMOGLOBIN AND HEMATOCRIT, BLOOD
HCT: 24.5 % — ABNORMAL LOW (ref 39.0–52.0)
Hemoglobin: 8 g/dL — ABNORMAL LOW (ref 13.0–17.0)

## 2019-07-07 LAB — MAGNESIUM: Magnesium: 2.1 mg/dL (ref 1.7–2.4)

## 2019-07-07 MED ORDER — ACETAMINOPHEN 325 MG PO TABS
650.0000 mg | ORAL_TABLET | Freq: Four times a day (QID) | ORAL | Status: DC | PRN
  Administered 2019-07-07 – 2019-07-11 (×10): 650 mg via ORAL
  Filled 2019-07-07 (×11): qty 2

## 2019-07-07 MED ORDER — LEVOTHYROXINE SODIUM 88 MCG PO TABS
88.0000 ug | ORAL_TABLET | Freq: Every day | ORAL | Status: DC
  Administered 2019-07-08 – 2019-07-13 (×5): 88 ug via ORAL
  Filled 2019-07-07 (×6): qty 1

## 2019-07-07 MED ORDER — POTASSIUM PHOSPHATES 15 MMOLE/5ML IV SOLN
20.0000 mmol | Freq: Once | INTRAVENOUS | Status: AC
  Administered 2019-07-07: 20 mmol via INTRAVENOUS
  Filled 2019-07-07: qty 6.67

## 2019-07-07 NOTE — Progress Notes (Signed)
Patient ID: Brian Stanton, male   DOB: 19-May-1961, 58 y.o.   MRN: [id number]   Patient was seen by me earlier this afternoon.  RN present.  Bilateral hip dressings clean dry and intact.  Hemoglobin early in the morning 8.5.  I ordered an H&H.  May need transfusion PRBCs if hemoglobin continues to drop.  Ordered posterior right hip precautions.

## 2019-07-07 NOTE — Evaluation (Signed)
Physical Therapy Evaluation Patient Details Name: Brian Stanton MRN: [id number] DOB: 1961/09/14 Today's Date: 07/07/2019   History of Present Illness  58 yo male admitted to ED on 9/22 from group home with bilateral hip fractures, R hip appearing more chronic than L. S/p R hip bipolar hemiarthroplasty posterior approach and L trochanteric IM nail on 9/23. PMH includes cognitive deficit, down syndrome, dementia.    Clinical Impression  Pt presents with bilateral LE pain, LE weakness, profound cognitive impairment, agitation with mobility, extreme difficulty performing bed mobility tasks, and decreased activity tolerance. Pt to benefit from acute PT to address deficits. Pt required max assist +2 for supine<>sit, pt limited by suspected pain and agitation with PT and PT aide. PT recommending SNF level of care post-acutely, pt from group home situation and case management following.  PT to progress mobility as tolerated, and will continue to follow acutely.        Follow Up Recommendations SNF;Supervision/Assistance - 24 hour    Equipment Recommendations  Other (comment)(defer to next venue)    Recommendations for Other Services       Precautions / Restrictions Precautions Precautions: Fall;Posterior Hip Precaution Booklet Issued: No Precaution Comments: Per Dr. [name], pt with R hemiarthroplasty posterior approach, but pt does not follow commands so do not educate pt on posterior precautions Restrictions Weight Bearing Restrictions: Yes RLE Weight Bearing: Weight bearing as tolerated LLE Weight Bearing: Touchdown weight bearing Other Position/Activity Restrictions: Pt appropriate for transfer to and from chair only, due to command following deficits      Mobility  Bed Mobility Overal bed mobility: Needs Assistance Bed Mobility: Supine to Sit;Sit to Supine     Supine to sit: Max assist;+2 for safety/equipment;+2 for physical assistance;HOB elevated Sit to supine: Max assist;+2 for  physical assistance;+2 for safety/equipment;HOB elevated   General bed mobility comments: Max assist +2 for supine<>sit for trunk elevation, LE lifting and translation to EOB. Pt sat EOB ~2 minutes before becoming agitated, pt returned to supine.  Transfers Overall transfer level: (NT)                  Ambulation/Gait                Stairs            Wheelchair Mobility    Modified Rankin (Stroke Patients Only)       Balance Overall balance assessment: Needs assistance Sitting-balance support: No upper extremity supported;Feet supported Sitting balance-Leahy Scale: Fair Sitting balance - Comments: Pt able to sit up unsupported by PT   Standing balance support: (NT)                                 Pertinent Vitals/Pain Pain Assessment: Faces Faces Pain Scale: Hurts little more Pain Location: LEs, with moving supine<>sit Pain Descriptors / Indicators: Grimacing;Guarding Pain Intervention(s): Limited activity within patient's tolerance;Monitored during session;Repositioned;Premedicated before session    Home Living Family/patient expects to be discharged to:: Skilled nursing facility                      Prior Function Level of Independence: Needs assistance   Gait / Transfers Assistance Needed: Per chart review, it appears pt was ambulating prior to hip fractures  ADL's / Homemaking Assistance Needed: Unsure of assist pt required, pt nonverbal        Hand Dominance        Extremity/Trunk Assessment  Upper Extremity Assessment Upper Extremity Assessment: Generalized weakness;Difficult to assess due to impaired cognition    Lower Extremity Assessment Lower Extremity Assessment: Generalized weakness;Difficult to assess due to impaired cognition    Cervical / Trunk Assessment Cervical / Trunk Assessment: Normal  Communication   Communication: Other (comment)(nonverbal)  Cognition Arousal/Alertness:  Awake/alert Behavior During Therapy: Agitated;Restless Overall Cognitive Status: History of cognitive impairments - at baseline                                 General Comments: pt with history of profound cognitive impairment, dementia. Pt does not follow commands, verbalizes "NO" x1 during session when asked to transfer.      General Comments      Exercises General Exercises - Lower Extremity Hip ABduction/ADduction: PROM;Both;5 reps;Supine   Assessment/Plan    PT Assessment Patient needs continued PT services  PT Problem List Decreased strength;Decreased mobility;Decreased safety awareness;Decreased cognition;Decreased activity tolerance;Decreased balance;Pain;Decreased range of motion       PT Treatment Interventions Therapeutic activities;DME instruction;Gait training;Functional mobility training;Therapeutic exercise;Patient/family education;Neuromuscular re-education;Balance training    PT Goals (Current goals can be found in the Care Plan section)  Acute Rehab PT Goals PT Goal Formulation: Patient unable to participate in goal setting Time For Goal Achievement: 07/21/19 Potential to Achieve Goals: Good    Frequency Min 2X/week   Barriers to discharge        Co-evaluation               AM-PAC PT "6 Clicks" Mobility  Outcome Measure Help needed turning from your back to your side while in a flat bed without using bedrails?: Total Help needed moving from lying on your back to sitting on the side of a flat bed without using bedrails?: Total Help needed moving to and from a bed to a chair (including a wheelchair)?: Total Help needed standing up from a chair using your arms (e.g., wheelchair or bedside chair)?: Total Help needed to walk in hospital room?: Total Help needed climbing 3-5 steps with a railing? : Total 6 Click Score: 6    End of Session   Activity Tolerance: Treatment limited secondary to agitation;Patient limited by fatigue Patient  left: in bed;with call bell/phone within reach;with bed alarm set;with restraints reapplied(mittens reapplied given pt agitation) Nurse Communication: Mobility status;Precautions PT Visit Diagnosis: Other abnormalities of gait and mobility (R26.89);Muscle weakness (generalized) (M62.81)    Time: 7001-7494 PT Time Calculation (min) (ACUTE ONLY): 13 min   Charges:   PT Evaluation $PT Eval Low Complexity: 1 Low          Halle Davlin Conception Chancy, PT Acute Rehabilitation Services Pager [phone]  Office [phone]   Gardiner Espana D Elonda Husky 07/07/2019, 12:57 PM

## 2019-07-07 NOTE — Progress Notes (Signed)
PROGRESS NOTE    Brian Stanton  ZOX:[id number] DOB: [birth year] DOA: 07/05/2019 PCP: Default, Provider, MD   Brief Narrative:  HPI per Dr. [name] on 07/05/2019 Brian Stanton is a 58 y.o. male with history of Down syndrome, intellectual disability, dementia, hypothyroidism who as per the report he is usually ambulatory was found to be not wanting to walk this morning and was complaining of knee pain.  The exact site of the right knee pain was not sure.  Given the symptoms patient was brought to the ER.  Patient's mother was unable to provide any more history at this time.  ED Course: In the ER patient had CT scan of the head and neck and abdomen.  Shows bilateral hip fracture the right one looks chronic in the left on those acute.  On-call orthopedic surgeon Dr. [name] was consulted and is planning to have surgery tomorrow.  CAT scan of the abdomen also shows features concerning for pneumonia.  And also shows chronic left UPJ obstruction and bladder wall thickening.  UA was unremarkable.  CT of the head and C-spine shows large fat-containing soft tissue in the posterior part of the neck which will need further work-up differentials include lipoma versus liposarcoma.  Patient admitted for further management of hip fracture.  **Interim History  Patient had some urinary retention so had to have a Foley catheter placed and this was removed today after surgical intervention.  Continues to have some agitation and is wearing safety mittens.  Underwent surgical intervention yesterday and had a left Biomet now short trochanteric nail with interlocking and the right hip bipolar hemiarthroplasty done by Dr. [name].  Postoperatively he has had acute anemia and hemoglobin is trending down we will need to further evaluate the need for any blood products.  Assessment & Plan:   Active Problems:   Closed intertrochanteric fracture of hip, left, initial encounter (HCC)   Closed displaced fracture of right  femoral neck (HCC)   Hip fracture (HCC)   Hypothyroidism   Closed left hip fracture, initial encounter (HCC)  Bilateral hip fracture with Acute Left Inertrochanteric status post a left trochanteric nail with interlocking as well as a right hip bipolar hemiarthroplasty postoperative day 1 -The left one appears to be acute as per the orthopedic surgeon and the right one looks chronic plan is to get surgery done on the left hip at this time. -Patient was kept n.p.o. this  the morning    -IV fluids as below with D5 normal saline at 50 mL's per hour now discontinued -Pain relief medications with IV morphine 0.5 mg every 2 as needed for severe pain and was given 1 dose of 4 mg once yesterday; added acetaminophen 650 mg p.o. every 6 PRN for mild pain -Orthopedic surgery has added aspirin 325 mg p.o. daily with breakfast but may need to hold given that the patient's Hgb is dropping  -Patient will be at moderate risk for intermediate risk procedure.  Suspected Pneumonia with Sepsis ruled out -Given the elevated lactate though patient is presently hemodynamically stable.  Did have an elevated procalcitonin level 5.18 -He was placed on the antibiotics with azithromycin and ceftriaxone as well as IV fluids with D5 normal saline at a rate of 50 mL's per hour for 24 hours -CT scan of the chest showed patchy airspace opacities in the medial basal segment of the right lower lobe which are new since prior study probably infectious versus inflammatory and questionable if patient has aspirated -Repeat chest x-ray in  a.m. -Lactic acid level was elevated at 4.3 is now trended down to 1.8 -Procalcitonin level was 5.18 and not repeated this morning -WBC trending down from 13.8 and is now 9.4 -Does not appear to be septic as he is afebrile has no tachypnea or tachycardia,  and leukocytosis likely secondary to his hip fracture and trending down -Blood cultures x2 showed no growth to date at 2 Days  -Repeat CBC in the  a.m. and repeat chest x-ray -Started the patient on guaifenesin 600 mg p.o. twice daily and will continue  Hypothyroidism  -TSH was 4.592 however free T4 was normal at 1.10 -We will dose IV Synthroid 44 mcg for now until patient can take orally.  History of dementia and intellect disability with history of Down syndrome  -On Donepezil 5 mg po Daily, Trazodone 25 mg po qHS, and Memantine 28 mg po Daily.  Soft tissue swelling in the posterior aspect of the neck concerning for lipoma versus liposarcoma  -will need further work-up as outpatient.  Chronic left UPJ obstruction  -UA unremarkable.  -Urinalysis showed clear appearance with yellow color urine and negative ketones negative leukocytes -CT Abdomen and Pelvis showed "Chronic left UPJ obstruction. Chronic diffuse thickening of the wall of the urinary bladder." -We will need to obtain a renal ultrasound today  Acute Urinary Retention  -Urinalysis was unremarkable and urine culture showed NGTD  -C/w Mirabergron ER 25 mg po q24h -Bladder scan showed 542 mL's and because patient was going for surgical intervention a Foley catheter was placed -We will need a renal ultrasound in 2 days and a trial of void once he is up and ambulatory after surgery  Elevated AST -Mild as AST was 45 and trended down to 26 -Continue to Monitor and Trend and repeat CMP in AM   Hypophosphatemia -Patient's Phos Level was 1.9 this Am -Replete with IV K Phos 20 mmol -Continue to Monitor and Replete as Necessary -Repeat Phos Level in AM   Acute Postoperative Blood Loss Anemia/Microcytic Anemia -Patient's hemoglobin/hematocrit went from 13.7/41.6 likely had a dilutional drop with IV fluid hydration and now is down to 8.0/24.5 -Likely postoperative drop given his surgeries -Continue to monitor for signs and symptoms of bleeding; currently no overt bleeding noted but will obtain an FOBT -Check anemia panel in the a.m. -Transfusion if hemoglobin drops below  7 -Repeat CBC in a.m.  Thrombocytopenia -Patient platelet count went from 232 and is now 143 and in the setting of postoperative bleeding and surgery -Continue to monitor for signs and symptoms of bleeding; currently no overt bleeding noted -Repeat CBC in a.m.  DVT prophylaxis: SCDs with pharmacological prophylaxis per orthopedic surgery Code Status: FULL CODE Family Communication: No family present at bedside  Disposition Plan: Pending further surgical intervention and evaluation by PT and OT  Consultants:   Orthopedic Surgery Dr. [name]   Procedures:  Left Biomet affixes 9 mm short trochanteric nail with interlock.  95 mm lag screw.  Right hip bipolar hemiarthroplasty, cemented stem.  43 mm ball. Depuy   Antimicrobials:  Anti-infectives (From admission, onward)   Start     Dose/Rate Route Frequency Ordered Stop   07/06/19 1730  ceFAZolin (ANCEF) IVPB 2g/100 mL premix     2 g 200 mL/hr over 30 Minutes Intravenous On call to O.R. 07/06/19 1705 07/06/19 1831   07/06/19 1713  ceFAZolin (ANCEF) 2-4 GM/100ML-% IVPB    Note to Pharmacy: Sharyn Creamer   : cabinet override      07/06/19 1713  07/06/19 1816   07/05/19 2200  cefTRIAXone (ROCEPHIN) 2 g in sodium chloride 0.9 % 100 mL IVPB     2 g 200 mL/hr over 30 Minutes Intravenous Every 24 hours 07/05/19 2100 07/10/19 2159   07/05/19 2200  azithromycin (ZITHROMAX) 500 mg in sodium chloride 0.9 % 250 mL IVPB     500 mg 250 mL/hr over 60 Minutes Intravenous Every 24 hours 07/05/19 2100 07/10/19 2159     Subjective: Patient is a thin pleasantly demented Caucasian male with intellectual disability and Down syndrome who is resting after surgery.  He was a little sleepy today and not as agitated.  PT evaluated working with him and recommending SNF.  Currently blood count is dropping and currently has no source of bleeding likely postoperative anemia.  Nursing reported that he complained of some mild pain so was given acetaminophen.  No  other concerns at present this time.  Objective: Vitals:   07/06/19 2335 07/07/19 0039 07/07/19 0136 07/07/19 0539  BP: 111/63 95/64 (!) 97/56 101/65  Pulse: 91 79 65 69  Resp: Temp: 98.7 F (37.1 C) 98.9 F (37.2 C) 98.4 F (36.9 C) 99.6 F (37.6 C)  TempSrc: Axillary Axillary Axillary Axillary  SpO2: 98% 93% 95% 94%    Intake/Output Summary (Last 24 hours) at 07/07/2019 1440 Last data filed at 07/07/2019 1054 Gross per 24 hour  Intake 2820 ml  Output 1785 ml  Net 1035 ml   There were no vitals filed for this visit.  Examination: Physical Exam:  Constitutional: Thin Caucasian Male with intellectual Disability and Down Syndrome in NAD and appears calm and more comfortable than yesterday Eyes: Lids and conjunctivae normal, sclerae anicteric  ENMT: External Ears, Nose appear normal. Grossly normal hearing.  Neck: Appears normal, supple, no cervical masses, normal ROM, no appreciable thyromegaly; no JVD Respiratory: Diminished to auscultation bilaterally, no wheezing, rales, rhonchi or crackles. Normal respiratory effort and patient is not tachypenic. No accessory muscle use.  Cardiovascular: RRR, no murmurs / rubs / gallops. S1 and S2 auscultated.   Abdomen: Soft, non-tender, non-distended. No masses palpated. No appreciable hepatosplenomegaly. Bowel sounds positive x4.  GU: Deferred. Foley removed Musculoskeletal: No clubbing / cyanosis of digits/nails. No joint deformity upper and lower extremities. Skin: No rashes, lesions, ulcers and Incisions appear C/D/I. No induration; Warm and dry.  Neurologic: Resting and remains wearing safety mittens and pushes me away when I examine and does not follow commands.  Romberg sign and cerebellar reflexes not assessed.  Psychiatric: Impaired judgment and insight. Drowsy today.   Data Reviewed: I have personally reviewed following labs and imaging studies  CBC: Recent Labs  Lab 07/05/19 1353 07/06/19 0031 07/06/19 2125  07/07/19 0500 07/07/19 1413  WBC 13.8* 11.8*  --  9.4  --   NEUTROABS 12.5*  --   --  8.2*  --   HGB 15.2 13.7 8.8* 8.5* 8.0*  HCT 46.0 41.6 26.0* 26.0* 24.5*  MCV 103.8* 104.0*  --  106.6*  --   PLT 270 232  --  143*  --    Basic Metabolic Panel: Recent Labs  Lab 07/05/19 1353 07/06/19 0031 07/06/19 2125 07/07/19 0500  NA 140 140 142 141  K 3.6 4.5 4.1 3.8  CL 103 105  --  110  CO2 25 26  --  25  GLUCOSE 146* 143*  --  140*  BUN 22* 21*  --  17  CREATININE 1.18 1.21  --  1.08  CALCIUM 8.9 8.6*  --  7.8*  MG  --   --   --  2.1  PHOS  --   --   --  1.9*   GFR: CrCl cannot be calculated (Unknown ideal weight.). Liver Function Tests: Recent Labs  Lab 07/05/19 1353 07/07/19 0500  AST 45* 26  ALT 26 18  ALKPHOS 62 37*  BILITOT 1.0 0.7  PROT 7.7 5.4*  ALBUMIN 3.6 2.8*   No results for input(s): LIPASE, AMYLASE in the last 168 hours. No results for input(s): AMMONIA in the last 168 hours. Coagulation Profile: No results for input(s): INR, PROTIME in the last 168 hours. Cardiac Enzymes: Recent Labs  Lab 07/05/19 1353  CKTOTAL 791*   BNP (last 3 results) No results for input(s): PROBNP in the last 8760 hours. HbA1C: No results for input(s): HGBA1C in the last 72 hours. CBG: Recent Labs  Lab 07/06/19 0627 07/06/19 1212 07/07/19 0035 07/07/19 0627 07/07/19 1215  GLUCAP 122* 143* 178* 119* 129*   Lipid Profile: No results for input(s): CHOL, HDL, LDLCALC, TRIG, CHOLHDL, LDLDIRECT in the last 72 hours. Thyroid Function Tests: Recent Labs    07/05/19 1342  TSH 4.592*  FREET4 1.10   Anemia Panel: No results for input(s): VITAMINB12, FOLATE, FERRITIN, TIBC, IRON, RETICCTPCT in the last 72 hours. Sepsis Labs: Recent Labs  Lab 07/05/19 1342 07/05/19 2133 07/06/19 0031  PROCALCITON  --  5.18  --   LATICACIDVEN 4.3* 3.2* 1.8    Recent Results (from the past 240 hour(s))  Urine culture     Status: None   Collection Time: 07/05/19  1:38 PM    Specimen: Urine, Random  Result Value Ref Range Status   Specimen Description   Final    URINE, RANDOM Performed at [facility], [address], [place], [place] [address]    Special Requests   Final    NONE Performed at [facility], [address], [place], [place] [address]    Culture   Final    NO GROWTH Performed at [facility], [address], [place], [place] [address]    Report Status 07/06/2019 FINAL  Final  SARS Coronavirus 2 [facility] order, Performed in [facility] hospital lab) Nasopharyngeal Urine, Catheterized     Status: None   Collection Time: 07/05/19  1:41 PM   Specimen: Urine, Catheterized; Nasopharyngeal  Result Value Ref Range Status   SARS Coronavirus 2 NEGATIVE NEGATIVE Final    Comment: (NOTE) If result is NEGATIVE SARS-CoV-2 target nucleic acids are NOT DETECTED. The SARS-CoV-2 RNA is generally detectable in upper and lower  respiratory specimens during the acute phase of infection. The lowest  concentration of SARS-CoV-2 viral copies this assay can detect is 250  copies / mL. A negative result does not preclude SARS-CoV-2 infection  and should not be used as the sole basis for treatment or other  patient management decisions.  A negative result may occur with  improper specimen collection / handling, submission of specimen other  than nasopharyngeal swab, presence of viral mutation(s) within the  areas targeted by this assay, and inadequate number of viral copies  (<250 copies / mL). A negative result must be combined with clinical  observations, patient history, and epidemiological information. If result is POSITIVE SARS-CoV-2 target nucleic acids are DETECTED. The SARS-CoV-2 RNA is generally detectable in upper and lower  respiratory specimens dur ing the acute phase of infection.  Positive  results are indicative of active  infection with SARS-CoV-2.  Clinical  correlation with patient history and other  diagnostic information is  necessary to determine patient infection status.  Positive results do  not rule out bacterial infection or co-infection with other viruses. If result is PRESUMPTIVE POSTIVE SARS-CoV-2 nucleic acids MAY BE PRESENT.   A presumptive positive result was obtained on the submitted specimen  and confirmed on repeat testing.  While 2019 novel coronavirus  (SARS-CoV-2) nucleic acids may be present in the submitted sample  additional confirmatory testing may be necessary for epidemiological  and / or clinical management purposes  to differentiate between  SARS-CoV-2 and other Sarbecovirus currently known to infect humans.  If clinically indicated additional testing with an alternate test  methodology [phone]) is advised. The SARS-CoV-2 RNA is generally  detectable in upper and lower respiratory sp ecimens during the acute  phase of infection. The expected result is Negative. Fact Sheet for Patients:  BoilerBrush.com.cy Fact Sheet for Healthcare Providers: https://pope.com/ This test is not yet approved or cleared by the [place] FDA and has been authorized for detection and/or diagnosis of SARS-CoV-2 by FDA under an Emergency Use Authorization (EUA).  This EUA will remain in effect (meaning this test can be used) for the duration of the COVID-19 declaration under Section 564(b)(1) of the Act, 21 U.S.C. section 360bbb-3(b)(1), unless the authorization is terminated or revoked sooner. Performed at [name], [address] [place], [place] [address]   Culture, blood (routine x 2)     Status: None (Preliminary result)   Collection Time: 07/05/19  9:33 PM   Specimen: BLOOD  Result Value Ref Range Status   Specimen Description   Final    BLOOD LEFT ANTECUBITAL Performed at [facility], [address], [place], [place] [address]    Special Requests   Final    BOTTLES DRAWN  AEROBIC AND ANAEROBIC Blood Culture adequate volume Performed at [facility], [address], [place], [place] [address]    Culture   Final    NO GROWTH 2 DAYS Performed at [facility], [address], [place], [place] [address]    Report Status PENDING  Incomplete  Culture, blood (routine x 2)     Status: None (Preliminary result)   Collection Time: 07/05/19  9:33 PM   Specimen: BLOOD  Result Value Ref Range Status   Specimen Description   Final    BLOOD RIGHT ANTECUBITAL Performed at [name], [address] [place], [place] [address]    Special Requests   Final    BOTTLES DRAWN AEROBIC AND ANAEROBIC Blood Culture adequate volume Performed at [facility], [address], [place], [place] [address]    Culture   Final    NO GROWTH 2 DAYS Performed at [facility], [address] [place], [place] [address]    Report Status PENDING  Incomplete  MRSA PCR Screening     Status: None   Collection Time: 07/06/19 12:04 AM   Specimen: Nasal Mucosa; Nasopharyngeal  Result Value Ref Range Status   MRSA by PCR NEGATIVE NEGATIVE Final    Comment:        The GeneXpert MRSA Assay (FDA approved for NASAL specimens only), is one component of a comprehensive MRSA colonization surveillance program. It is not intended to diagnose MRSA infection nor to guide or monitor treatment for MRSA infections. Performed at [facility], [address] [place], [place] [address]  Radiology Studies: Dg Chest 1 View  Result Date: 07/05/2019 CLINICAL DATA:  58 year old male with dementia and altered level of consciousness/activity level today. EXAM: CHEST  1 VIEW COMPARISON:  Prior chest x-ray 07/19/2009 FINDINGS: The lungs are clear and negative for focal airspace consolidation, pulmonary edema or suspicious pulmonary nodule. No pleural effusion or pneumothorax. Cardiac and mediastinal contours are within  normal limits. Atherosclerotic calcifications visualized along the aorta. No acute fracture or lytic or blastic osseous lesions. The visualized upper abdominal bowel gas pattern is unremarkable. IMPRESSION: No active disease. Electronically Signed   By: Malachy MoanHeath  McCullough M.D.   On: 07/05/2019 15:28   Ct Head Wo Contrast  Result Date: 07/05/2019 CLINICAL DATA:  Altered mental status.  No known injury EXAM: CT HEAD WITHOUT CONTRAST CT CERVICAL SPINE WITHOUT CONTRAST TECHNIQUE: Multidetector CT imaging of the head and cervical spine was performed following the standard protocol without intravenous contrast. Multiplanar CT image reconstructions of the cervical spine were also generated. COMPARISON:  None. FINDINGS: CT HEAD FINDINGS Brain: Motion degraded study.  Multiple repeats Moderate atrophy without hydrocephalus. No acute infarct. Negative for acute hemorrhage or mass. Benign-appearing calcification basal ganglia bilaterally. Vascular: Negative for hyperdense vessel Skull: Negative Sinuses/Orbits: Mild mucosal edema left maxillary sinus likely odontogenic with periapical abscess left upper teeth. Other: None CT CERVICAL SPINE FINDINGS Alignment: 2 mm anterolisthesis C3-4 and C7-T1. Mild retrolisthesis C4-5 and C5-6. Mild anterolisthesis C6-7. Skull base and vertebrae: Negative for cervical spine fracture Soft tissues and spinal canal: Fat containing mass in the subcutaneous tissues of the posterior neck measures approximately 6.6 x 3.1 x 8.3 cm. There is stranding within the fatty mass as well as stranding in the surrounding tissues. Disc levels: Multilevel disc and facet degeneration throughout the cervical spine. Multilevel foraminal stenosis due to spurring. Upper chest: Negative Other: Motion degraded study.  Multiple repeat images. IMPRESSION: 1. Motion degraded CT head and cervical spine 2. No acute intracranial abnormality.  Moderate atrophy. 3. Cervical spondylosis.  Negative for fracture. 4. Large fat  containing mass in the subcutaneous tissues posterior neck. This may represent a lipoma or liposarcoma. Physical examination and close follow-up recommended. Electronically Signed   By: Marlan Palauharles  Clark M.D.   On: 07/05/2019 18:44   Ct Cervical Spine Wo Contrast  Result Date: 07/05/2019 CLINICAL DATA:  Altered mental status.  No known injury EXAM: CT HEAD WITHOUT CONTRAST CT CERVICAL SPINE WITHOUT CONTRAST TECHNIQUE: Multidetector CT imaging of the head and cervical spine was performed following the standard protocol without intravenous contrast. Multiplanar CT image reconstructions of the cervical spine were also generated. COMPARISON:  None. FINDINGS: CT HEAD FINDINGS Brain: Motion degraded study.  Multiple repeats Moderate atrophy without hydrocephalus. No acute infarct. Negative for acute hemorrhage or mass. Benign-appearing calcification basal ganglia bilaterally. Vascular: Negative for hyperdense vessel Skull: Negative Sinuses/Orbits: Mild mucosal edema left maxillary sinus likely odontogenic with periapical abscess left upper teeth. Other: None CT CERVICAL SPINE FINDINGS Alignment: 2 mm anterolisthesis C3-4 and C7-T1. Mild retrolisthesis C4-5 and C5-6. Mild anterolisthesis C6-7. Skull base and vertebrae: Negative for cervical spine fracture Soft tissues and spinal canal: Fat containing mass in the subcutaneous tissues of the posterior neck measures approximately 6.6 x 3.1 x 8.3 cm. There is stranding within the fatty mass as well as stranding in the surrounding tissues. Disc levels: Multilevel disc and facet degeneration throughout the cervical spine. Multilevel foraminal stenosis due to spurring. Upper chest: Negative Other: Motion degraded study.  Multiple repeat images. IMPRESSION: 1. Motion degraded CT head  and cervical spine 2. No acute intracranial abnormality.  Moderate atrophy. 3. Cervical spondylosis.  Negative for fracture. 4. Large fat containing mass in the subcutaneous tissues posterior neck.  This may represent a lipoma or liposarcoma. Physical examination and close follow-up recommended. Electronically Signed   By: Marlan Palau M.D.   On: 07/05/2019 18:44   Ct Abdomen Pelvis W Contrast  Result Date: 07/05/2019 CLINICAL DATA:  had a BM in bed. According to nursing home personnel he usually walks and moves around as he wants to but today is not wanting to walk or stand. He is guarding his knee (side is questionable). No reported mechanism of injury, fall, or trauma. Patient has a history of down syndrome and is non verbal and combative. EXAM: CT ABDOMEN AND PELVIS WITH CONTRAST TECHNIQUE: Multidetector CT imaging of the abdomen and pelvis was performed using the standard protocol following bolus administration of intravenous contrast. CONTRAST:  OMNIPAQUE IOHEXOL 300 MG/ML  SOLN COMPARISON:  01/16/2012 FINDINGS: Lower chest: No pleural or pericardial effusion. Patchy airspace opacities in the medial basal segment right lower lobe, new since previous. Hepatobiliary: No focal liver abnormality is seen. No gallstones, gallbladder wall thickening, or biliary dilatation. Pancreas: Unremarkable. No pancreatic ductal dilatation or surrounding inflammatory changes. Spleen: Normal in size. Scattered punctate calcified granulomas. Adrenals/Urinary Tract: Normal adrenals. Right kidney unremarkable. Chronic left UPJ obstruction. No renal mass. Urinary bladder incompletely distended, thick-walled. Stomach/Bowel: Small hiatal hernia. Stomach is nondilated. Small bowel decompressed. Normal appendix. The colon is nondilated. Scattered sigmoid diverticula without significant adjacent inflammatory/edematous change or abscess. Moderate rectal fecal material. Vascular/Lymphatic: No significant vascular findings are present. No enlarged abdominal or pelvic lymph nodes. Reproductive: Prostate normal in size with central coarse calcifications. Other: No ascites. No free air. Musculoskeletal: Comminuted left  intertrochanteric femur fracture with mild varus deformity. Regional soft tissue swelling. Chronic appearing subcapital or midcervical right femur fracture, new since 01/16/2012, with resorption along the fracture lines. Bony pelvis intact. Degenerative disc disease L1-2. 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 IMPRESSION: 1. Comminuted left intertrochanteric femur fracture with mild varus deformity. 2. 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 3. Patchy airspace opacities in the medial basal segment right lower lobe, new since previous study, possibly infectious/inflammatory. 4.  Sigmoid diverticulosis. 5. Small hiatal hernia. 6. Chronic left UPJ obstruction. 7. Chronic diffuse thickening of the wall of the urinary bladder. Electronically Signed   By: Corlis Leak M.D.   On: 07/05/2019 18:31   Pelvis Portable  Result Date: 07/06/2019 CLINICAL DATA:  Postop EXAM: PORTABLE PELVIS 1-2 VIEWS COMPARISON:  07/05/2019 FINDINGS: Changes of right hip hemiarthroplasty and internal fixation across the left femoral intertrochanteric fracture. No hardware complicating feature. Normal AP alignment. IMPRESSION: Postoperative changes in the hips bilaterally as above. No visible hardware complicating feature. Electronically Signed   By: Charlett Nose M.D.   On: 07/06/2019 23:07   Dg Knee Complete 4 Views Right  Result Date: 07/05/2019 CLINICAL DATA:  Right knee pain EXAM: RIGHT KNEE - COMPLETE 4+ VIEW COMPARISON:  None. FINDINGS: No evidence of fracture, dislocation, or joint effusion. No evidence of arthropathy or other focal bone abnormality. Soft tissues are unremarkable. IMPRESSION: Negative. Electronically Signed   By: Elige Ko   On: 07/05/2019 15:24   Dg C-arm 1-60 Min-no Report  Result Date: 07/06/2019 Fluoroscopy was utilized by the requesting physician.  No radiographic interpretation.   Dg C-arm 1-60 Min-no Report  Result Date:  07/06/2019 Fluoroscopy was  utilized by the requesting physician.  No radiographic interpretation.   Dg Hip Operative Unilat W Or W/o Pelvis Left  Result Date: 07/06/2019 CLINICAL DATA:  Comminuted left intertrochanteric femur fracture EXAM: OPERATIVE LEFT HIP (WITH PELVIS IF PERFORMED)  VIEWS TECHNIQUE: Fluoroscopic spot image(s) were submitted for interpretation post-operatively. COMPARISON:  None. FINDINGS: Intraop fluoroscopic spot images document placement of IM rod with distal interlocking screw and proximal sliding screw across the femoral neck, transfixing comminuted left IT femur fracture in near anatomic alignment. IMPRESSION: Internal fixation of left intertrochanteric femur fracture. Electronically Signed   By: Lucrezia Europe M.D.   On: 07/06/2019 19:45   Dg Hip Operative Unilat W Or W/o Pelvis Right  Result Date: 07/06/2019 CLINICAL DATA:  Right hip fracture EXAM: OPERATIVE RIGHT HIP (WITH PELVIS IF PERFORMED) 5 VIEWS TECHNIQUE: Fluoroscopic spot image(s) were submitted for interpretation post-operatively. COMPARISON:  07/05/2019 FINDINGS: Multiple intraoperative spot images demonstrate changes of right hip replacement. No hardware bony complicating feature. IMPRESSION: Right hip replacement.  No complicating feature. Electronically Signed   By: Rolm Baptise M.D.   On: 07/06/2019 21:37   Dg Hips Bilat W Or Wo Pelvis 3-4 Views  Result Date: 07/05/2019 CLINICAL DATA:  Patient unable to walk today. EXAM: DG HIP (WITH OR WITHOUT PELVIS) 3-4V BILAT COMPARISON:  None. FINDINGS: Displaced right subcapital femoral neck fracture. Slight sclerosis along the fracture cleft which may reflect a subacute injury. Comminuted left intertrochanteric fracture with apex lateral angulation. No other acute fracture or dislocation. No aggressive osseous lesion. IMPRESSION: Displaced right subcapital femoral neck fracture. Slight sclerosis along the fracture cleft which may reflect a subacute injury. Comminuted left  intertrochanteric fracture with apex lateral angulation. Electronically Signed   By: Kathreen Devoid   On: 07/05/2019 15:26   Scheduled Meds:  aspirin EC  325 mg Oral Q breakfast   docusate sodium  100 mg Oral BID   donepezil  5 mg Oral Daily   guaiFENesin  600 mg Oral BID   levothyroxine  44 mcg Intravenous Daily   lubiprostone  24 mcg Oral BID WC   Melatonin  3 mg Oral QHS   memantine  28 mg Oral Daily   mirabegron ER  25 mg Oral Q24H   traZODone  25 mg Oral QHS   Continuous Infusions:  azithromycin Stopped (07/06/19 0240)   cefTRIAXone (ROCEPHIN)  IV 25 mL/hr at 07/06/19 0610   potassium PHOSPHATE IVPB (in mmol) 20 mmol (07/07/19 1054)    LOS: 2 days   Kerney Elbe, DO [facility] PAGER is on [place]  If 7PM-7AM, please contact night-coverage [email or web address] Password [email or web address] 07/07/2019, 2:40 PM

## 2019-07-07 NOTE — TOC Progression Note (Signed)
Transition of Care [facility]) - Progression Note    Patient Details  Name: Brian Stanton MRN: [id number] Date of Birth: [birth year]  [facility] CM/SW Contact  Purcell Mouton, RN Phone Number: 07/07/2019, 11:37 AM  Clinical Narrative:    Spoke with pt's Luke [phone], concerning giving pt information to [place].  Vaughan Basta states it is okay to give normal information to [place]. Elwin Sleight, [place] ext [phone] called and return call to [place] was made. Elwin Sleight is asking for daily reports from RN staff. This CM explained that H&P and Discharge Summary may be faxed to [place]. ED notes will not be sent. [place] fax number is [phone], will fax H&P.    Expected Discharge Plan: Skilled Nursing Facility Barriers to Discharge: Continued Medical Work up  Expected Discharge Plan and Services Expected Discharge Plan: [place]         Expected Discharge Date: 07/07/19                                     Social Determinants of Health (SDOH) Interventions    Readmission Risk Interventions No flowsheet data found.

## 2019-07-08 ENCOUNTER — Inpatient Hospital Stay (HOSPITAL_COMMUNITY): Payer: Medicare Other

## 2019-07-08 ENCOUNTER — Encounter (HOSPITAL_COMMUNITY): Payer: Self-pay | Admitting: Orthopaedic Surgery

## 2019-07-08 LAB — GLUCOSE, CAPILLARY: Glucose-Capillary: 102 mg/dL — ABNORMAL HIGH (ref 70–99)

## 2019-07-08 LAB — CBC WITH DIFFERENTIAL/PLATELET
Abs Immature Granulocytes: 0.03 10*3/uL (ref 0.00–0.07)
Basophils Absolute: 0.1 10*3/uL (ref 0.0–0.1)
Basophils Relative: 1 %
Eosinophils Absolute: 0.1 10*3/uL (ref 0.0–0.5)
Eosinophils Relative: 2 %
HCT: 22.8 % — ABNORMAL LOW (ref 39.0–52.0)
Hemoglobin: 7.6 g/dL — ABNORMAL LOW (ref 13.0–17.0)
Immature Granulocytes: 0 %
Lymphocytes Relative: 17 %
Lymphs Abs: 1.2 10*3/uL (ref 0.7–4.0)
MCH: 35.5 pg — ABNORMAL HIGH (ref 26.0–34.0)
MCHC: 33.3 g/dL (ref 30.0–36.0)
MCV: 106.5 fL — ABNORMAL HIGH (ref 80.0–100.0)
Monocytes Absolute: 0.8 10*3/uL (ref 0.1–1.0)
Monocytes Relative: 12 %
Neutro Abs: 5 10*3/uL (ref 1.7–7.7)
Neutrophils Relative %: 68 %
Platelets: 142 10*3/uL — ABNORMAL LOW (ref 150–400)
RBC: 2.14 MIL/uL — ABNORMAL LOW (ref 4.22–5.81)
RDW: 13.9 % (ref 11.5–15.5)
WBC: 7.3 10*3/uL (ref 4.0–10.5)
nRBC: 0 % (ref 0.0–0.2)

## 2019-07-08 LAB — COMPREHENSIVE METABOLIC PANEL
ALT: 16 U/L (ref 0–44)
AST: 26 U/L (ref 15–41)
Albumin: 2.5 g/dL — ABNORMAL LOW (ref 3.5–5.0)
Alkaline Phosphatase: 39 U/L (ref 38–126)
Anion gap: 7 (ref 5–15)
BUN: 16 mg/dL (ref 6–20)
CO2: 25 mmol/L (ref 22–32)
Calcium: 8 mg/dL — ABNORMAL LOW (ref 8.9–10.3)
Chloride: 110 mmol/L (ref 98–111)
Creatinine, Ser: 1.07 mg/dL (ref 0.61–1.24)
GFR calc Af Amer: >60 mL/min (ref >60)
GFR calc non Af Amer: >60 mL/min (ref >60)
Glucose, Bld: 91 mg/dL (ref 70–99)
Potassium: 3.4 mmol/L — ABNORMAL LOW (ref 3.5–5.1)
Sodium: 142 mmol/L (ref 135–145)
Total Bilirubin: 0.8 mg/dL (ref 0.3–1.2)
Total Protein: 5.1 g/dL — ABNORMAL LOW (ref 6.5–8.1)

## 2019-07-08 LAB — IRON AND TIBC
Iron: 12 ug/dL — ABNORMAL LOW (ref 45–182)
Saturation Ratios: 9 % — ABNORMAL LOW (ref 17.9–39.5)
TIBC: 139 ug/dL — ABNORMAL LOW (ref 250–450)
UIBC: 127 ug/dL

## 2019-07-08 LAB — RETICULOCYTES
Immature Retic Fract: 16.1 % — ABNORMAL HIGH (ref 2.3–15.9)
RBC.: 2.14 MIL/uL — ABNORMAL LOW (ref 4.22–5.81)
Retic Count, Absolute: 30 10*3/uL (ref 19.0–186.0)
Retic Ct Pct: 1.4 % (ref 0.4–3.1)

## 2019-07-08 LAB — VITAMIN B12: Vitamin B-12: 314 pg/mL (ref 180–914)

## 2019-07-08 LAB — MAGNESIUM: Magnesium: 2.1 mg/dL (ref 1.7–2.4)

## 2019-07-08 LAB — PHOSPHORUS: Phosphorus: 2.8 mg/dL (ref 2.5–4.6)

## 2019-07-08 LAB — FOLATE: Folate: 17.8 ng/mL (ref >5.9)

## 2019-07-08 LAB — FERRITIN: Ferritin: 178 ng/mL (ref 24–336)

## 2019-07-08 MED ORDER — SODIUM CHLORIDE 0.9 % IV BOLUS
500.0000 mL | Freq: Once | INTRAVENOUS | Status: AC
  Administered 2019-07-08: 500 mL via INTRAVENOUS

## 2019-07-08 MED ORDER — SODIUM CHLORIDE 0.9 % IV SOLN
INTRAVENOUS | Status: DC
  Administered 2019-07-08 – 2019-07-09 (×2): via INTRAVENOUS

## 2019-07-08 MED ORDER — POLYSACCHARIDE IRON COMPLEX 150 MG PO CAPS
150.0000 mg | ORAL_CAPSULE | Freq: Every day | ORAL | Status: DC
  Administered 2019-07-08 – 2019-07-11 (×4): 150 mg via ORAL
  Filled 2019-07-08 (×6): qty 1

## 2019-07-08 MED ORDER — POTASSIUM CHLORIDE CRYS ER 20 MEQ PO TBCR
40.0000 meq | EXTENDED_RELEASE_TABLET | Freq: Two times a day (BID) | ORAL | Status: AC
  Administered 2019-07-08 (×2): 40 meq via ORAL
  Filled 2019-07-08 (×2): qty 2

## 2019-07-08 MED ORDER — AZITHROMYCIN 250 MG PO TABS: 500.0000 mg | ORAL_TABLET | Freq: Every day | ORAL | Status: DC

## 2019-07-08 NOTE — Progress Notes (Signed)
PROGRESS NOTE    Brian Stanton  ZOX:[id number] DOB: Aug 12, 1961 DOA: 07/05/2019 PCP: Default, Provider, MD   Brief Narrative:  HPI per Dr. [name] on 07/05/2019 Brian Stanton is a 58 y.o. male with history of Down syndrome, intellectual disability, dementia, hypothyroidism who as per the report he is usually ambulatory was found to be not wanting to walk this morning and was complaining of knee pain.  The exact site of the right knee pain was not sure.  Given the symptoms patient was brought to the ER.  Patient's mother was unable to provide any more history at this time.  ED Course: In the ER patient had CT scan of the head and neck and abdomen.  Shows bilateral hip fracture the right one looks chronic in the left on those acute.  On-call orthopedic surgeon Dr. [name] was consulted and is planning to have surgery tomorrow.  CAT scan of the abdomen also shows features concerning for pneumonia.  And also shows chronic left UPJ obstruction and bladder wall thickening.  UA was unremarkable.  CT of the head and C-spine shows large fat-containing soft tissue in the posterior part of the neck which will need further work-up differentials include lipoma versus liposarcoma.  Patient admitted for further management of hip fracture.  **Interim History  Patient had some urinary retention so had to have a Foley catheter placed and this was removed today after surgical intervention.  Continues to have some agitation and is wearing safety mittens.  Underwent surgical intervention yesterday and had a left Biomet now short trochanteric nail with interlocking and the right hip bipolar hemiarthroplasty done by Dr. [name].  Postoperatively he has had acute anemia and hemoglobin is trending down we will need to further evaluate the need for any blood products and Hgb/Hct has slowed dropping and is now 7.6/22.8.   Assessment & Plan:   Active Problems:   Closed intertrochanteric fracture of hip, left, initial  encounter (HCC)   Closed displaced fracture of right femoral neck (HCC)   Hip fracture (HCC)   Hypothyroidism   Closed nondisplaced intertrochanteric fracture of left femur (HCC)  Bilateral hip fracture with Acute Left Inertrochanteric status post a left trochanteric nail with interlocking as well as a right hip bipolar hemiarthroplasty postoperative day 2 -The left one appears to be acute as per the orthopedic surgeon and the right one looks chronic plan is to get surgery done on the left hip at this time. -Patient was kept n.p.o. this  the morning    -IV fluids as below with D5 normal saline at 50 mL's per hour now discontinued -Pain relief medications with IV morphine 0.5 mg every 2 as needed for severe pain and was given 1 dose of 4 mg once yesterday; added acetaminophen 650 mg p.o. every 6 PRN for mild pain -Orthopedic surgery has added aspirin 325 mg p.o. daily with breakfast but may need to hold given that the patient's Hgb is dropping  -Patient will be at moderate risk for intermediate risk procedure. -PT/OT recommending SNF and unfortunately patient cannot go to his group home because of his level of acuity and will have the case manager and TOC work on finding a SNF placement prior to him returning to his group home given his acuity of care  Suspected Pneumonia with Sepsis ruled out; Likely was Inflammatory -Given the elevated lactate though patient is presently hemodynamically stable.  Did have an elevated procalcitonin level 5.18 -He was placed on the antibiotics with azithromycin and ceftriaxone  as well as IV fluids with D5 normal saline at a rate of 50 mL's per hour for 24 hours; Will Stop Abx as repeat CXR is improved  -CT scan of the chest showed patchy airspace opacities in the medial basal segment of the right lower lobe which are new since prior study probably infectious versus inflammatory and questionable if patient has aspirated -Repeat chest x-ray this AM showed "Normal heart  size and mediastinal contours. Granulomatous nodal calcifications in the left mediastinum. Mild interstitial crowding at the bases. There is no edema, consolidation, effusion, or pneumothorax." -Lactic acid level was elevated at 4.3 is now trended down to 1.8 -Procalcitonin level was 5.18 and not repeated this morning -WBC trending down from 13.8 and is now 7.3 -Does not appear to be septic as he is afebrile has no tachypnea or tachycardia,  and leukocytosis likely secondary to his hip fracture and trending down -Blood cultures x2 showed no growth to date at 3 Days  -Repeat CBC in the a.m. and repeat chest x-ray -Started the patient on guaifenesin 600 mg p.o. twice daily and will continue  Hypothyroidism  -TSH was 4.592 however free T4 was normal at 1.10 -Resumed Home Levothyroxine 88 mcg  History of dementia and intellect disability with history of Down syndrome  -On Donepezil 5 mg po Daily, Trazodone 25 mg po qHS, and Memantine 28 mg po Daily.  Soft tissue swelling in the posterior aspect of the neck concerning for lipoma versus liposarcoma  -Will need further work-up as outpatient.  Chronic left UPJ obstruction  -UA unremarkable.  -Urinalysis showed clear appearance with yellow color urine and negative ketones negative leukocytes -CT Abdomen and Pelvis showed "Chronic left UPJ obstruction. Chronic diffuse thickening of the wall of the urinary bladder." -We will need to obtain a renal ultrasound today  Acute Urinary Retention, improved  -Urinalysis was unremarkable and urine culture showed NGTD  -C/w Mirabergron ER 25 mg po q24h -Bladder scan showed 542 mL's and because patient was going for surgical intervention a Foley catheter was placed and now removed post-operatively -Will obtain a Renal ultrasound now  Elevated AST -Mild as AST was 45 and trended down to 26 -Continue to Monitor and Trend and repeat CMP in AM   Hypophosphatemia -Patient's Phos Level was 2.8 this  Am -Continue to Monitor and Replete as Necessary -Repeat Phos Level in AM   Acute Postoperative Blood Loss Anemia/Microcytic Anemia -Patient's hemoglobin/hematocrit went from 13.7/41.6 likely had a dilutional drop with IV fluid hydration and now is down to 7.6/22.8 -Likely postoperative drop given his surgeries -Continue to monitor for signs and symptoms of bleeding; currently no overt bleeding noted but will obtain an FOBT -Patient was started on Iron Polysaccharides 150 mg p.o. daily -Checked Anemia Panel and showed iron level of 12, U IBC 127, TIBC of 139, saturation ratio of 9%, ferritin level of 178, folate level 17.8, and vitamin B12 level of 314 -Transfusion if hemoglobin drops below 7 -Repeat CBC in a.m.  Thrombocytopenia -Patient platelet count went from 232 and is now 142 and in the setting of postoperative bleeding and surgery -Continue to monitor for signs and symptoms of bleeding; currently no overt bleeding noted -Repeat CBC in a.m.   Hypokalemia The patient's potassium is now 3.4 -Replete with p.o. potassium chloride 40 mEq twice daily x2 doses Continue to monitor and replete as necessary -Repeat CMP in a.m.  DVT prophylaxis: SCDs with pharmacological prophylaxis per orthopedic surgery Code Status: FULL CODE Family Communication: No family  present at bedside  Disposition Plan: SNF when bed is available  Consultants:   Orthopedic Surgery Dr. [name]   Procedures:  Left Biomet affixes 9 mm short trochanteric nail with interlock.  95 mm lag screw.  Right hip bipolar hemiarthroplasty, cemented stem.  43 mm ball. Depuy   Antimicrobials:  Anti-infectives (From admission, onward)   Start     Dose/Rate Route Frequency Ordered Stop   07/08/19 2200  azithromycin (ZITHROMAX) tablet 500 mg     500 mg Oral Daily at bedtime 07/08/19 1050 07/10/19 2159   07/06/19 1730  ceFAZolin (ANCEF) IVPB 2g/100 mL premix     2 g 200 mL/hr over 30 Minutes Intravenous On call to O.R.  07/06/19 1705 07/06/19 1831   07/06/19 1713  ceFAZolin (ANCEF) 2-4 GM/100ML-% IVPB    Note to Pharmacy: Sharyn Creamer   : cabinet override      07/06/19 1713 07/06/19 1816   07/05/19 2200  cefTRIAXone (ROCEPHIN) 2 g in sodium chloride 0.9 % 100 mL IVPB     2 g 200 mL/hr over 30 Minutes Intravenous Every 24 hours 07/05/19 2100 07/10/19 1559   07/05/19 2200  azithromycin (ZITHROMAX) 500 mg in sodium chloride 0.9 % 250 mL IVPB  Status:  Discontinued     500 mg 250 mL/hr over 60 Minutes Intravenous Every 24 hours 07/05/19 2100 07/08/19 1050     Subjective: Patient is a thin pleasantly demented Caucasian male with intellectual disability and Down syndrome   Objective: Vitals:   07/07/19 1820 07/07/19 2119 07/08/19 0507 07/08/19 1126  BP: (!) 105/53 (!) 92/51 (!) 99/53 (!) 101/47  Pulse: 77 80 80 73  Resp: 18  18 14   Temp: 98.8 F (37.1 C) 98.4 F (36.9 C) 98 F (36.7 C) 98.9 F (37.2 C)  TempSrc: Oral Oral Oral Axillary  SpO2: 97%   92%    Intake/Output Summary (Last 24 hours) at 07/08/2019 1635 Last data filed at 07/08/2019 1500 Gross per 24 hour  Intake 1488.05 ml  Output 100 ml  Net 1388.05 ml   There were no vitals filed for this visit.  Examination: Physical Exam:  Constitutional: Thin Caucasian male with intellectual disability and Down syndrome in no acute distress appears calm and in no acute distress currently Eyes: PERRL, lids and conjunctivae normal, sclerae anicteric  ENMT: External Ears, Nose appear normal. Grossly normal hearing.  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 Normal respiratory effort and patient is not tachypenic. No accessory muscle use.  Unlabored breathing and not wearing any supplemental oxygen via nasal cannula Cardiovascular: RRR, no  murmurs / rubs / gallops. S1 and S2 auscultated.  Abdomen: Soft, non-tender, non-distended. Bowel sounds positive x4.  GU: Deferred. Musculoskeletal: No clubbing / cyanosis of digits/nails. No joint deformity upper and lower extremities.  Skin: No rashes, lesions, ulcers but  incisions from surgery appear clean dry and intact. No induration; Warm and dry.  Neurologic: Does not follow commands and is continuing to wear safety mittens and pushes me away when I try to examine his stomach Psychiatric: Impaired judgment and insight. Alert and awake. Not as anxious mood and appropriate affect.     Data Reviewed: I have personally reviewed following labs and imaging studies  CBC: Recent Labs  Lab 07/05/19 1353 07/06/19 0031 07/06/19 2125 07/07/19 0500 07/07/19 1413 07/08/19 0429  WBC  13.8* 11.8*  --  9.4  --  7.3  NEUTROABS 12.5*  --   --  8.2*  --  5.0  HGB 15.2 13.7 8.8* 8.5* 8.0* 7.6*  HCT 46.0 41.6 26.0* 26.0* 24.5* 22.8*  MCV 103.8* 104.0*  --  106.6*  --  106.5*  PLT 270 232  --  143*  --  142*   Basic Metabolic Panel: Recent Labs  Lab 07/05/19 1353 07/06/19 0031 07/06/19 2125 07/07/19 0500 07/08/19 0429  NA 140 140 142 141 142  K 3.6 4.5 4.1 3.8 3.4*  CL 103 105  --  110 110  CO2 25 26  --  25 25  GLUCOSE 146* 143*  --  140* 91  BUN 22* 21*  --  17 16  CREATININE 1.18 1.21  --  1.08 1.07  CALCIUM 8.9 8.6*  --  7.8* 8.0*  MG  --   --   --  2.1 2.1  PHOS  --   --   --  1.9* 2.8   GFR: CrCl cannot be calculated (Unknown ideal weight.). Liver Function Tests: Recent Labs  Lab 07/05/19 1353 07/07/19 0500 07/08/19 0429  AST 45* 26 26  ALT ALKPHOS 62 37* 39  BILITOT 1.0 0.7 0.8  PROT 7.7 5.4* 5.1*  ALBUMIN 3.6 2.8* 2.5*   No results for input(s): LIPASE, AMYLASE in the last 168 hours. No results for input(s): AMMONIA in the last 168 hours. Coagulation Profile: No results for input(s): INR, PROTIME in the last 168 hours. Cardiac Enzymes: Recent Labs    Lab 07/05/19 1353  CKTOTAL 791*   BNP (last 3 results) No results for input(s): PROBNP in the last 8760 hours. HbA1C: No results for input(s): HGBA1C in the last 72 hours. CBG: Recent Labs  Lab 07/07/19 0035 07/07/19 0627 07/07/19 1215 07/07/19 1731 07/07/19 2344  GLUCAP 178* 119* 129* 101* 114*   Lipid Profile: No results for input(s): CHOL, HDL, LDLCALC, TRIG, CHOLHDL, LDLDIRECT in the last 72 hours. Thyroid Function Tests: No results for input(s): TSH, T4TOTAL, FREET4, T3FREE, THYROIDAB in the last 72 hours. Anemia Panel: Recent Labs    07/08/19 0429 07/08/19 0430  VITAMINB12  --  314  FOLATE  --  17.8  FERRITIN  --  178  TIBC  --  139*  IRON  --  12*  RETICCTPCT 1.4  --    Sepsis Labs: Recent Labs  Lab 07/05/19 1342 07/05/19 2133 07/06/19 0031  PROCALCITON  --  5.18  --   LATICACIDVEN 4.3* 3.2* 1.8    Recent Results (from the past 240 hour(s))  Urine culture     Status: None   Collection Time: 07/05/19  1:38 PM   Specimen: Urine, Random  Result Value Ref Range Status   Specimen Description   Final    URINE, RANDOM Performed at [facility], [address], [place], [place] [address]    Special Requests   Final    NONE Performed at [facility], [address], [place], [place] [address]    Culture   Final    NO GROWTH Performed at [facility], [address], [place], [place] [address]    Report Status 07/06/2019 FINAL  Final  SARS Coronavirus 2 [facility] order, Performed in [facility] hospital lab) Nasopharyngeal Urine, Catheterized     Status: None   Collection Time: 07/05/19  1:41 PM   Specimen: Urine, Catheterized; Nasopharyngeal  Result Value Ref  Range Status   SARS Coronavirus 2 NEGATIVE NEGATIVE Final    Comment: (NOTE) If result is NEGATIVE SARS-CoV-2 target nucleic acids are NOT DETECTED. The SARS-CoV-2 RNA is generally detectable in upper and lower  respiratory specimens during the  acute phase of infection. The lowest  concentration of SARS-CoV-2 viral copies this assay can detect is 250  copies / mL. A negative result does not preclude SARS-CoV-2 infection  and should not be used as the sole basis for treatment or other  patient management decisions.  A negative result may occur with  improper specimen collection / handling, submission of specimen other  than nasopharyngeal swab, presence of viral mutation(s) within the  areas targeted by this assay, and inadequate number of viral copies  (<250 copies / mL). A negative result must be combined with clinical  observations, patient history, and epidemiological information. If result is POSITIVE SARS-CoV-2 target nucleic acids are DETECTED. The SARS-CoV-2 RNA is generally detectable in upper and lower  respiratory specimens dur ing the acute phase of infection.  Positive  results are indicative of active infection with SARS-CoV-2.  Clinical  correlation with patient history and other diagnostic information is  necessary to determine patient infection status.  Positive results do  not rule out bacterial infection or co-infection with other viruses. If result is PRESUMPTIVE POSTIVE SARS-CoV-2 nucleic acids MAY BE PRESENT.   A presumptive positive result was obtained on the submitted specimen  and confirmed on repeat testing.  While 2019 novel coronavirus  (SARS-CoV-2) nucleic acids may be present in the submitted sample  additional confirmatory testing may be necessary for epidemiological  and / or clinical management purposes  to differentiate between  SARS-CoV-2 and other Sarbecovirus currently known to infect humans.  If clinically indicated additional testing with an alternate test  methodology [phone]) is advised. The SARS-CoV-2 RNA is generally  detectable in upper and lower respiratory sp ecimens during the acute  phase of infection. The expected result is Negative. Fact Sheet for Patients:   BoilerBrush.com.cy Fact Sheet for Healthcare Providers: https://pope.com/ This test is not yet approved or cleared by the [place] FDA and has been authorized for detection and/or diagnosis of SARS-CoV-2 by FDA under an Emergency Use Authorization (EUA).  This EUA will remain in effect (meaning this test can be used) for the duration of the COVID-19 declaration under Section 564(b)(1) of the Act, 21 U.S.C. section 360bbb-3(b)(1), unless the authorization is terminated or revoked sooner. Performed at [facility], [address] [place], [place] [address]   Culture, blood (routine x 2)     Status: None (Preliminary result)   Collection Time: 07/05/19  9:33 PM   Specimen: BLOOD  Result Value Ref Range Status   Specimen Description   Final    BLOOD LEFT ANTECUBITAL Performed at [facility], [address], [place], [place] [address]    Special Requests   Final    BOTTLES DRAWN AEROBIC AND ANAEROBIC Blood Culture adequate volume Performed at [facility], [address], [place], [place] [address]    Culture   Final    NO GROWTH 3 DAYS Performed at [facility], [address], [place], [place] [address]    Report Status PENDING  Incomplete  Culture, blood (routine x 2)     Status: None (Preliminary result)   Collection Time: 07/05/19  9:33 PM   Specimen: BLOOD  Result Value Ref Range Status   Specimen Description  Final    BLOOD RIGHT ANTECUBITAL Performed at [place] [address], [place], [place] [address]    Special Requests   Final    BOTTLES DRAWN AEROBIC AND ANAEROBIC Blood Culture adequate volume Performed at [place] [address], [place], [place] [address]    Culture   Final    NO GROWTH 3 DAYS Performed at [facility], [place] [address], [place], [place] [address]    Report Status PENDING   Incomplete  MRSA PCR Screening     Status: None   Collection Time: 07/06/19 12:04 AM   Specimen: Nasal Mucosa; Nasopharyngeal  Result Value Ref Range Status   MRSA by PCR NEGATIVE NEGATIVE Final    Comment:        The GeneXpert MRSA Assay (FDA approved for NASAL specimens only), is one component of a comprehensive MRSA colonization surveillance program. It is not intended to diagnose MRSA infection nor to guide or monitor treatment for MRSA infections. Performed at [facility], [place] [address], [place], [place] [address]     Radiology Studies: Pelvis Portable  Result Date: 07/06/2019 CLINICAL DATA:  Postop EXAM: PORTABLE PELVIS 1-2 VIEWS COMPARISON:  07/05/2019 FINDINGS: Changes of right hip hemiarthroplasty and internal fixation across the left femoral intertrochanteric fracture. No hardware complicating feature. Normal AP alignment. IMPRESSION: Postoperative changes in the hips bilaterally as above. No visible hardware complicating feature. Electronically Signed   By: Rolm Baptise M.D.   On: 07/06/2019 23:07   Dg Chest Port 1 View  Result Date: 07/08/2019 CLINICAL DATA:  Shortness of breath EXAM: PORTABLE CHEST 1 VIEW COMPARISON:  07/05/2019 FINDINGS: Normal heart size and mediastinal contours. Granulomatous nodal calcifications in the left mediastinum. Mild interstitial crowding at the bases. There is no edema, consolidation, effusion, or pneumothorax. IMPRESSION: No evidence of active disease. Electronically Signed   By: Monte Fantasia M.D.   On: 07/08/2019 11:04   Dg C-arm 1-60 Min-no Report  Result Date: 07/06/2019 Fluoroscopy was utilized by the requesting physician.  No radiographic interpretation.   Dg C-arm 1-60 Min-no Report  Result Date: 07/06/2019 Fluoroscopy was utilized by the requesting physician.  No radiographic interpretation.   Dg Hip Operative Unilat W Or W/o Pelvis Left  Result Date: 07/06/2019 CLINICAL DATA:  Comminuted left  intertrochanteric femur fracture EXAM: OPERATIVE LEFT HIP (WITH PELVIS IF PERFORMED)  VIEWS TECHNIQUE: Fluoroscopic spot image(s) were submitted for interpretation post-operatively. COMPARISON:  None. FINDINGS: Intraop fluoroscopic spot images document placement of IM rod with distal interlocking screw and proximal sliding screw across the femoral neck, transfixing comminuted left IT femur fracture in near anatomic alignment. IMPRESSION: Internal fixation of left intertrochanteric femur fracture. Electronically Signed   By: Lucrezia Europe M.D.   On: 07/06/2019 19:45   Dg Hip Operative Unilat W Or W/o Pelvis Right  Result Date: 07/06/2019 CLINICAL DATA:  Right hip fracture EXAM: OPERATIVE RIGHT HIP (WITH PELVIS IF PERFORMED) 5 VIEWS TECHNIQUE: Fluoroscopic spot image(s) were submitted for interpretation post-operatively. COMPARISON:  07/05/2019 FINDINGS: Multiple intraoperative spot images demonstrate changes of right hip replacement. No hardware bony complicating feature. IMPRESSION: Right hip replacement.  No complicating feature. Electronically Signed   By: Rolm Baptise M.D.   On: 07/06/2019 21:37   Scheduled Meds:  aspirin EC  325 mg Oral Q breakfast   azithromycin  500 mg Oral QHS   docusate sodium  100 mg Oral BID   donepezil  5 mg Oral Daily   guaiFENesin  600 mg Oral BID   iron polysaccharides  150 mg Oral Daily   levothyroxine  88 mcg Oral QAC breakfast   lubiprostone  24 mcg Oral BID WC   Melatonin  3 mg Oral QHS   memantine  28 mg Oral Daily   mirabegron ER  25 mg Oral Q24H   potassium chloride  40 mEq Oral BID   traZODone  25 mg Oral QHS   Continuous Infusions:  cefTRIAXone (ROCEPHIN)  IV Stopped (07/07/19 1858)    LOS: 3 days   Merlene Laughter, DO [facility] PAGER is on AMION  If 7PM-7AM, please contact night-coverage [email or web address] Password [facility] 07/08/2019, 4:35 PM
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
## 2019-07-08 NOTE — Anesthesia Postprocedure Evaluation (Signed)
Anesthesia Post Note  Patient: Brian Stanton  Procedure(s) Performed: INTRAMEDULLARY (IM) NAIL INTERTROCHANTRIC (Left ) ARTHROPLASTY BIPOLAR HIP (HEMIARTHROPLASTY) (Right Hip)     Patient location during evaluation: PACU Anesthesia Type: General Level of consciousness: awake and alert Pain management: pain level controlled Vital Signs Assessment: post-procedure vital signs reviewed and stable Respiratory status: spontaneous breathing, nonlabored ventilation and respiratory function stable Cardiovascular status: blood pressure returned to baseline and stable Postop Assessment: no apparent nausea or vomiting Anesthetic complications: no    Last Vitals:  Vitals:   07/07/19 2119 07/08/19 0507  BP: (!) 92/51 (!) 99/53  Pulse: 80 80  Resp:  18  Temp: 36.9 C 36.7 C  SpO2:      Last Pain:  Vitals:   07/08/19 0507  TempSrc: Oral  PainSc:                  Catalina Gravel

## 2019-07-08 NOTE — Care Management Important Message (Signed)
Important Message  Patient Details IM Letter given to Cookie McGibboney RN to present to the Patient Name: Kainoah Bartosiewicz MRN: [id number] Date of Birth: 08-23-1961   Medicare Important Message Given:  Yes     Kerin Salen 07/08/2019, 11:19 AM

## 2019-07-08 NOTE — TOC Progression Note (Signed)
Transition of Care [facility]) - Progression Note    Patient Details  Name: Euclid Cassetta MRN: [id number] Date of Birth: 1960-12-13  Transition of Care [facility]) CM/SW Contact  Purcell Mouton, RN Phone Number: 07/08/2019, 1:22 PM  Clinical Narrative:    Spoke with pt's sister Vaughan Basta concerning discharge plans who asked that this CM speak with her sister Bethena Roys at pt's bedside. Judy asked to check with [facility] for the ability to take pt back. A call to [phone] #211 Tomeka, RN was called. Sister also gave Rodena Medin, RN # [phone]. Tomeka states that she will check with DON and get back with this CM. Bethena Roys states that [facility] was good to pt and that he had been there 10 years. Bethena Roys continued that she would visit her brother at different times, he was always looking good and happy.   Expected Discharge Plan: Skilled Nursing Facility Barriers to Discharge: Continued Medical Work up  Expected Discharge Plan and Services Expected Discharge Plan: [place]         Expected Discharge Date: 07/07/19                                     Social Determinants of Health (SDOH) Interventions    Readmission Risk Interventions No flowsheet data found.

## 2019-07-08 NOTE — TOC Progression Note (Signed)
Transition of Care [facility]) - Progression Note    Patient Details  Name: Kathy Wares MRN: [id number] Date of Birth: 1961/02/16  Transition of Care [facility]) CM/SW Contact  Purcell Mouton, RN Phone Number: 07/08/2019, 4:36 PM  Clinical Narrative:    Tomeka from [facility] called because of the high acuity rehab that pt will need. [facility] will not be able to take pt back until after he has gone to Rehab facility first. [facility] would like daily report and discharge information when pt discharges. Pt has mitten on his left hand at present time. TOC team to continue to follow up.    Expected Discharge Plan: Skilled Nursing Facility Barriers to Discharge: Continued Medical Work up  Expected Discharge Plan and Services Expected Discharge Plan: [place]         Expected Discharge Date: 07/07/19                                     Social Determinants of Health (SDOH) Interventions    Readmission Risk Interventions No flowsheet data found.

## 2019-07-09 DIAGNOSIS — D62 Acute posthemorrhagic anemia: Secondary | ICD-10-CM

## 2019-07-09 LAB — CBC WITH DIFFERENTIAL/PLATELET
Abs Immature Granulocytes: 0.04 10*3/uL (ref 0.00–0.07)
Basophils Absolute: 0.1 10*3/uL (ref 0.0–0.1)
Basophils Relative: 1 %
Eosinophils Absolute: 0.2 10*3/uL (ref 0.0–0.5)
Eosinophils Relative: 2 %
HCT: 23.8 % — ABNORMAL LOW (ref 39.0–52.0)
Hemoglobin: 7.5 g/dL — ABNORMAL LOW (ref 13.0–17.0)
Immature Granulocytes: 0 %
Lymphocytes Relative: 6 %
Lymphs Abs: 0.6 10*3/uL — ABNORMAL LOW (ref 0.7–4.0)
MCH: 33.9 pg (ref 26.0–34.0)
MCHC: 31.5 g/dL (ref 30.0–36.0)
MCV: 107.7 fL — ABNORMAL HIGH (ref 80.0–100.0)
Monocytes Absolute: 0.6 10*3/uL (ref 0.1–1.0)
Monocytes Relative: 6 %
Neutro Abs: 8 10*3/uL — ABNORMAL HIGH (ref 1.7–7.7)
Neutrophils Relative %: 85 %
Platelets: 192 10*3/uL (ref 150–400)
RBC: 2.21 MIL/uL — ABNORMAL LOW (ref 4.22–5.81)
RDW: 13.9 % (ref 11.5–15.5)
WBC: 9.5 10*3/uL (ref 4.0–10.5)
nRBC: 0 % (ref 0.0–0.2)

## 2019-07-09 LAB — COMPREHENSIVE METABOLIC PANEL
ALT: 21 U/L (ref 0–44)
AST: 31 U/L (ref 15–41)
Albumin: 2.4 g/dL — ABNORMAL LOW (ref 3.5–5.0)
Alkaline Phosphatase: 53 U/L (ref 38–126)
Anion gap: 5 (ref 5–15)
BUN: 15 mg/dL (ref 6–20)
CO2: 25 mmol/L (ref 22–32)
Calcium: 8 mg/dL — ABNORMAL LOW (ref 8.9–10.3)
Chloride: 116 mmol/L — ABNORMAL HIGH (ref 98–111)
Creatinine, Ser: 0.86 mg/dL (ref 0.61–1.24)
GFR calc Af Amer: >60 mL/min (ref >60)
GFR calc non Af Amer: >60 mL/min (ref >60)
Glucose, Bld: 110 mg/dL — ABNORMAL HIGH (ref 70–99)
Potassium: 3.7 mmol/L (ref 3.5–5.1)
Sodium: 146 mmol/L — ABNORMAL HIGH (ref 135–145)
Total Bilirubin: 0.9 mg/dL (ref 0.3–1.2)
Total Protein: 5.4 g/dL — ABNORMAL LOW (ref 6.5–8.1)

## 2019-07-09 LAB — GLUCOSE, CAPILLARY
Glucose-Capillary: 101 mg/dL — ABNORMAL HIGH (ref 70–99)
Glucose-Capillary: 107 mg/dL — ABNORMAL HIGH (ref 70–99)
Glucose-Capillary: 117 mg/dL — ABNORMAL HIGH (ref 70–99)
Glucose-Capillary: 96 mg/dL (ref 70–99)

## 2019-07-09 LAB — MAGNESIUM: Magnesium: 2.3 mg/dL (ref 1.7–2.4)

## 2019-07-09 LAB — PHOSPHORUS: Phosphorus: 2.5 mg/dL (ref 2.5–4.6)

## 2019-07-09 MED ORDER — DEXTROSE 5 % IV SOLN
INTRAVENOUS | Status: DC
  Administered 2019-07-09 – 2019-07-10 (×3): via INTRAVENOUS

## 2019-07-09 MED ORDER — SODIUM CHLORIDE 0.9 % IV SOLN
510.0000 mg | Freq: Once | INTRAVENOUS | Status: AC
  Administered 2019-07-09: 11:00:00 510 mg via INTRAVENOUS
  Filled 2019-07-09: qty 17

## 2019-07-09 NOTE — Progress Notes (Addendum)
     Subjective: 3 Days Post-Op Procedure(s) (LRB): INTRAMEDULLARY (IM) NAIL INTERTROCHANTRIC (Left) ARTHROPLASTY BIPOLAR HIP (HEMIARTHROPLASTY) (Right) Awake, alert, not conversant, dementia, Down's syndrome. No response to questions, moves head and arm spontaneously, left hand mitten to prevent IV pulling. Does move feet when requested to move them.   Patient reports pain as mild.    Objective:   VITALS:  Temp:  [98.6 F (37 C)-99.2 F (37.3 C)] 99.2 F (37.3 C) (09/26 0545) Pulse Rate:  [61-88] 61 (09/26 0545) Resp:  [14-16] 16 (09/26 0545) BP: (101-112)/(47-64) 112/64 (09/26 0545) SpO2:  [92 %-95 %] 95 % (09/26 0545)  ABD soft Neurovascular intact Sensation intact distally Intact pulses distally Dorsiflexion/Plantar flexion intact Incision: dressing C/D/I, no drainage and Both incision dressings are dry, minimal swelling.   LABS Recent Labs    07/06/19 2125  07/07/19 0500 07/07/19 1413 07/08/19 0429 07/09/19 0446  HGB 8.8*  --  8.5* 8.0* 7.6* 7.5*  WBC  --    < > 9.4  --  7.3 9.5  PLT  --    < > 143*  --  142* 192   < > = values in this interval not displayed.   Recent Labs    07/08/19 0429 07/09/19 0446  NA 142 146*  K 3.4* 3.7  CL 110 116*  CO2 25 25  BUN 16 15  CREATININE 1.07 0.86  GLUCOSE 91 110*   No results for input(s): LABPT, INR in the last 72 hours.   Assessment/Plan: 3 Days Post-Op Procedure(s) (LRB): INTRAMEDULLARY (IM) NAIL INTERTROCHANTRIC (Left) ARTHROPLASTY BIPOLAR HIP (HEMIARTHROPLASTY) (Right)  Anemia 7.6 Hgb today.  Advance diet Up with therapy D/C IV fluids Discharge to SNF  [name] 07/09/2019, 9:30 AMPatient ID: Brian Stanton, male   DOB: 1961/09/06, 58 y.o.   MRN: [id number]

## 2019-07-09 NOTE — Progress Notes (Signed)
PROGRESS NOTE    Brian AblerJohn Stanton  WUJ:[id number] DOB: 1961/03/05 DOA: 07/05/2019 PCP: Default, Provider, MD   Brief Narrative:  HPI per Dr. Midge MiniumArshad Stanton on 07/05/2019 Brian AblerJohn Stanton is a 58 y.o. male with history of Down syndrome, intellectual disability, dementia, hypothyroidism who as per the report he is usually ambulatory was found to be not wanting to walk this morning and was complaining of knee pain.  The exact site of the right knee pain was not sure.  Given the symptoms patient was brought to the ER.  Patient's mother was unable to provide any more history at this time.  ED Course: In the ER patient had CT scan of the head and neck and abdomen.  Shows bilateral hip fracture the right one looks chronic in the left on those acute.  On-call orthopedic surgeon Dr. Ophelia Stanton was consulted and is planning to have surgery tomorrow.  CAT scan of the abdomen also shows features concerning for pneumonia.  And also shows chronic left UPJ obstruction and bladder wall thickening.  UA was unremarkable.  CT of the head and C-spine shows large fat-containing soft tissue in the posterior part of the neck which will need further work-up differentials include lipoma versus liposarcoma.  Patient admitted for further management of hip fracture.  **Interim History  Patient had some urinary retention so had to have a Foley catheter placed and this was removed today after surgical intervention.  Continues to have some agitation and is wearing safety mittens.  Underwent surgical intervention yesterday and had a left Biomet now short trochanteric nail with interlocking and the right hip bipolar hemiarthroplasty done by Dr. Ophelia Stanton.  Postoperatively he has had acute anemia and hemoglobin is trending down we will need to further evaluate the need for any blood products and Hgb/Hct has slowed dropping and is now 7.5/23.8.   Assessment & Plan:   Active Problems:   Closed intertrochanteric fracture of hip, left, initial  encounter (HCC)   Closed displaced fracture of right femoral neck (HCC)   Hip fracture (HCC)   Hypothyroidism   Closed nondisplaced intertrochanteric fracture of left femur (HCC)   Anemia due to acute blood loss  Bilateral hip fracture with Acute Left Inertrochanteric status post a left trochanteric nail with interlocking as well as a right hip bipolar hemiarthroplasty postoperative day 3 -The left one appears to be acute as per the orthopedic surgeon and the right one looks chronic plan is to get surgery done on the left hip at this time -Patient is status post surgical intervention with intramedullary nail intratrochanteric on the left and total right arthroplasty bipolar hip her right -Orthopedic surgery recommending advancing diet up with therapy and discontinue IV fluids as well as discharge to SNF when bed is available.  -Pain relief medications with IV morphine 0.5 mg every 2 as needed for severe pain and was given 1 dose of 4 mg once yesterday; added acetaminophen 650 mg p.o. every 6 PRN for mild pain -Orthopedic surgery has added aspirin 325 mg p.o. daily with breakfast but may need to hold given that the patient's Hgb is dropping  -Patient will be at moderate risk for intermediate risk procedure. -PT/OT recommending SNF and unfortunately patient cannot go to his group home because of his level of acuity and will have the case manager and TOC work on finding a SNF placement prior to him returning to his group home given his acuity of care  Suspected Pneumonia with Sepsis ruled out; Likely was Inflammatory -Given the  elevated lactate though patient is presently hemodynamically stable.  Did have an elevated procalcitonin level 5.18 -He was placed on the antibiotics with azithromycin and ceftriaxone as well as IV fluids with D5 normal saline at a rate of 50 mL's per hour for 24 hours; Will Stop Abx as repeat CXR is improved  -CT scan of the chest showed patchy airspace opacities in the  medial basal segment of the right lower lobe which are new since prior study probably infectious versus inflammatory and questionable if patient has aspirated -Repeat chest x-ray this AM showed "Normal heart size and mediastinal contours. Granulomatous nodal calcifications in the left mediastinum. Mild interstitial crowding at the bases. There is no edema, consolidation, effusion, or pneumothorax." -Lactic acid level was elevated at 4.3 is now trended down to 1.8 -Procalcitonin level was 5.18 and not repeated this morning -WBC trending down from 13.8 -> 7.3 -> 9.5 -Does not appear to be septic as he is afebrile has no tachypnea or tachycardia,  and leukocytosis likely secondary to his hip fracture and trending down -Blood cultures x2 showed no growth to date at 4 Days  -Repeat CBC in the a.m. and repeat chest x-ray -Started the patient on guaifenesin 600 mg p.o. twice daily and will continue  Hypothyroidism  -TSH was 4.592 however free T4 was normal at 1.10 -Resumed Home Levothyroxine 88 mcg  History of dementia and intellect disability with history of Down syndrome  -On Donepezil 5 mg po Daily, Trazodone 25 mg po qHS, and Memantine 28 mg po Daily.  Soft tissue swelling in the posterior aspect of the neck concerning for lipoma versus liposarcoma  -Will need further work-up as outpatient.  Chronic left UPJ obstruction  -UA unremarkable.  -Urinalysis showed clear appearance with yellow color urine and negative ketones negative leukocytes -CT Abdomen and Pelvis showed "Chronic left UPJ obstruction. Chronic diffuse thickening of the wall of the urinary bladder." -Obtain a renal ultrasound and showed "Stable moderate to severe left hydronephrosis related to chronic UPJ obstruction. Mild bladder wall thickening could be related to chronic bladder outlet obstruction or cystitis." -Outpatient Urology follow up   Acute Urinary Retention, improved  -Urinalysis was unremarkable and urine culture  showed NGTD  -C/w Mirabergron ER 25 mg po q24h -Bladder scan showed 542 mL's and because patient was going for surgical intervention a Foley catheter was placed and now removed post-operatively -Will obtain a Renal ultrasound now and showed "Stable moderate to severe left hydronephrosis related to chronic UPJ obstruction. Mild bladder wall thickening could be related to chronic bladder outlet obstruction or cystitis." -Will need to bladder scan every shift  Poor urine output -Patient had 125 mL's of output all day yesterday per nursing staff -Bladder scan done yesterday showed values of 222 and repeat was 163 -Started on IVF yesterday and given a bolus of NS of 500 mL/hr and started on Maintenance IVF with NS at 75 mL/hr and changed to D5W this AM given Hypernatremia/Hyperchloremia  Elevated AST -Mild as AST was 45 and trended down to 31 -Continue to Monitor and Trend and repeat CMP in AM   Hypophosphatemia -Patient's Phos Level was 2.5 this AM -Continue to Monitor and Replete as Necessary -Repeat Phos Level in AM   Acute Postoperative Blood Loss Anemia/Microcytic Anemia -Patient's hemoglobin/hematocrit went from 13.7/41.6 likely had a dilutional drop with IV fluid hydration and now is down to 7.5/23.8 -Likely postoperative drop given his surgeries -Continue to monitor for signs and symptoms of bleeding; currently no overt bleeding  noted but will obtain an FOBT -Patient was started on Iron Polysaccharides 150 mg p.o. daily and Will give a dose of Ferumoxytol 510 mg IV x1 today  -Checked Anemia Panel and showed iron level of 12, U IBC 127, TIBC of 139, saturation ratio of 9%, ferritin level of 178, folate level 17.8, and vitamin B12 level of 314 -Transfusion if hemoglobin drops below 7 -Repeat CBC in a.m.  Thrombocytopenia, improved  -Patient platelet count went from 232 and trended down to 142 and in the setting of postoperative bleeding and surgery; Now is 192 -Continue to monitor for  signs and symptoms of bleeding; currently no overt bleeding noted -Repeat CBC in a.m.   Hypokalemia, improved  -The patient's potassium is now 3.7 -Continue to monitor and replete as necessary -Repeat CMP in a.m.  Hypernatremia/Hyperchloremia -Patient's Na+ was 146 and Chloride was 116 -IVF were changed to D5W at 75 mL/hr this AM and now stopped by Ortho -Continue to Monitor and Repeat CMP in AM   DVT prophylaxis: SCDs with pharmacological prophylaxis per orthopedic surgery Code Status: FULL CODE Family Communication: No family present at bedside  Disposition Plan: SNF when bed is available  Consultants:   Orthopedic Surgery Dr. Lorin Mercy   Procedures:  Left Biomet affixes 9 mm short trochanteric nail with interlock.  95 mm lag screw.  Right hip bipolar hemiarthroplasty, cemented stem.  43 mm ball. Depuy   Antimicrobials:  Anti-infectives (From admission, onward)   Start     Dose/Rate Route Frequency Ordered Stop   07/08/19 2200  azithromycin (ZITHROMAX) tablet 500 mg  Status:  Discontinued     500 mg Oral Daily at bedtime 07/08/19 1050 07/08/19 1647   07/06/19 1730  ceFAZolin (ANCEF) IVPB 2g/100 mL premix     2 g 200 mL/hr over 30 Minutes Intravenous On call to O.R. 07/06/19 1705 07/06/19 1831   07/06/19 1713  ceFAZolin (ANCEF) 2-4 GM/100ML-% IVPB    Note to Pharmacy: Georgena Spurling   : cabinet override      07/06/19 1713 07/06/19 1816   07/05/19 2200  cefTRIAXone (ROCEPHIN) 2 g in sodium chloride 0.9 % 100 mL IVPB  Status:  Discontinued     2 g 200 mL/hr over 30 Minutes Intravenous Every 24 hours 07/05/19 2100 07/08/19 1647   07/05/19 2200  azithromycin (ZITHROMAX) 500 mg in sodium chloride 0.9 % 250 mL IVPB  Status:  Discontinued     500 mg 250 mL/hr over 60 Minutes Intravenous Every 24 hours 07/05/19 2100 07/08/19 1050     Subjective: Patient is a thin pleasantly demented Caucasian male with intellectual disability and Down syndrome who does not respond to questioning  and is currently nonverbal.  Did not really follow commands and is unable to provide a subjective history.  Appears comfortable not in any pain  Objective: Vitals:   07/08/19 1126 07/08/19 2136 07/09/19 0545 07/09/19 1044  BP: (!) 101/47 106/60 112/64   Pulse: 73 88 61   Resp: 14 16 16    Temp: 98.9 F (37.2 C) 98.6 F (37 C) 99.2 F (37.3 C)   TempSrc: Axillary Oral Oral   SpO2: 92% 95% 95%   Weight:    59 kg  Height:    4\' 11"  (1.499 m)    Intake/Output Summary (Last 24 hours) at 07/09/2019 1603 Last data filed at 07/09/2019 0900 Gross per 24 hour  Intake 218 ml  Output 725 ml  Net -507 ml   Filed Weights   07/09/19 1044  Weight: 59 kg   Examination: Physical Exam:  Constitutional: Thin Caucasian male with intellectual disability and Down syndrome in no acute distress appears calm, NAD and appears calm and comfortable Eyes: Lids and conjunctivae normal, sclerae anicteric  ENMT: External Ears, Nose appear normal. Grossly normal hearing.  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  Unlabored breathing and not wearing any supplemental oxygen via nasal cannula Cardiovascular: RRR, no murmurs / rubs / gallops. S1 and S2 auscultated. No extremity edema. Abdomen: Soft, non-tender, non-distended. Bowel sounds positive x4.  GU: Deferred. Musculoskeletal: No clubbing / cyanosis of digits/nails. No joint deformity upper and lower extremities. Good ROM, no contractures. Normal strength and muscle tone.  Skin: No rashes, lesions, ulcers on a limited skin evaluation and incisions appear clean dry and intact. Warm and dry.  Neurologic: Does not follow commands and continues to wear safety mittens. Psychiatric: Impaired judgment and insight.  Awake and does not appear to be as anxious today   Data Reviewed: I have personally reviewed following labs and imaging studies  CBC: Recent Labs  Lab 07/05/19 1353 07/06/19 0031 07/06/19 2125 07/07/19 0500 07/07/19 1413 07/08/19 0429 07/09/19 0446  WBC 13.8* 11.8*  --  9.4  --  7.3 9.5  NEUTROABS 12.5*  --   --  8.2*  --  5.0 8.0*  HGB 15.2 13.7 8.8* 8.5* 8.0* 7.6* 7.5*  HCT 46.0 41.6 26.0* 26.0* 24.5* 22.8* 23.8*  MCV 103.8* 104.0*  --  106.6*  --  106.5* 107.7*  PLT 270 232  --  143*  --  142* 192   Basic Metabolic Panel: Recent Labs  Lab 07/05/19 1353 07/06/19 0031 07/06/19 2125 07/07/19 0500 07/08/19 0429 07/09/19 0446  NA 140 140 142 141 142 146*  K 3.6 4.5 4.1 3.8 3.4* 3.7  CL 103 105  --  110 110 116*  CO2 25 26  --  GLUCOSE 146* 143*  --  140* 91 110*  BUN 22* 21*  --  CREATININE 1.18 1.21  --  1.08 1.07 0.86  CALCIUM 8.9 8.6*  --  7.8* 8.0* 8.0*  MG  --   --   --  2.1 2.1 2.3  PHOS  --   --   --  1.9* 2.8 2.5   GFR: Estimated Creatinine Clearance: 68.3 mL/min (by C-G formula based on SCr of 0.86 mg/dL). Liver Function Tests: Recent Labs  Lab 07/05/19 1353 07/07/19 0500 07/08/19 0429 07/09/19 0446  AST 45* ALT ALKPHOS 62 37* 39 53  BILITOT 1.0 0.7 0.8 0.9  PROT 7.7 5.4* 5.1* 5.4*  ALBUMIN 3.6 2.8* 2.5* 2.4*   No results for input(s): LIPASE, AMYLASE in the last 168 hours. No results for input(s): AMMONIA in the last 168 hours. Coagulation Profile: No results for input(s): INR, PROTIME in the last 168 hours. Cardiac Enzymes: Recent Labs  Lab 07/05/19 1353  CKTOTAL 791*   BNP (last 3 results) No results for input(s): PROBNP in the last 8760 hours. HbA1C: No results for input(s): HGBA1C in the last 72 hours. CBG: Recent Labs  Lab 07/07/19 1731 07/07/19 2344 07/08/19 2346 07/09/19 0543 07/09/19 1126  GLUCAP 101* 114* 102* 107* 117*   Lipid Profile: No results for input(s): CHOL, HDL, LDLCALC, TRIG, CHOLHDL, LDLDIRECT in  the last 72 hours.  Thyroid Function Tests: No results for input(s): TSH, T4TOTAL, FREET4, T3FREE, THYROIDAB in the last 72 hours. Anemia Panel: Recent Labs    07/08/19 0429 07/08/19 0430  VITAMINB12  --  314  FOLATE  --  17.8  FERRITIN  --  178  TIBC  --  139*  IRON  --  12*  RETICCTPCT 1.4  --    Sepsis Labs: Recent Labs  Lab 07/05/19 1342 07/05/19 2133 07/06/19 0031  PROCALCITON  --  5.18  --   LATICACIDVEN 4.3* 3.2* 1.8    Recent Results (from the past 240 hour(s))  Urine culture     Status: None   Collection Time: 07/05/19  1:38 PM   Specimen: Urine, Random  Result Value Ref Range Status   Specimen Description   Final    URINE, RANDOM Performed at [facility], [address] [place], [place] [address]    Special Requests   Final    NONE Performed at [facility], [address], [place], [place] [address]    Culture   Final    NO GROWTH Performed at [facility], [address], [place], [place] [address]    Report Status 07/06/2019 FINAL  Final  SARS Coronavirus 2 [facility] order, Performed in [facility] hospital lab) Nasopharyngeal Urine, Catheterized     Status: None   Collection Time: 07/05/19  1:41 PM   Specimen: Urine, Catheterized; Nasopharyngeal  Result Value Ref Range Status   SARS Coronavirus 2 NEGATIVE NEGATIVE Final    Comment: (NOTE) If result is NEGATIVE SARS-CoV-2 target nucleic acids are NOT DETECTED. The SARS-CoV-2 RNA is generally detectable in upper and lower  respiratory specimens during the acute phase of infection. The lowest  concentration of SARS-CoV-2 viral copies this assay can detect is 250  copies / mL. A negative result does not preclude SARS-CoV-2 infection  and should not be used as the sole basis for treatment or other  patient management decisions.  A negative result may occur with  improper specimen collection / handling, submission of specimen other  than nasopharyngeal swab, presence of viral  mutation(s) within the  areas targeted by this assay, and inadequate number of viral copies  (<250 copies / mL). A negative result must be combined with clinical  observations, patient history, and epidemiological information. If result is POSITIVE SARS-CoV-2 target nucleic acids are DETECTED. The SARS-CoV-2 RNA is generally detectable in upper and lower  respiratory specimens dur ing the acute phase of infection.  Positive  results are indicative of active infection with SARS-CoV-2.  Clinical  correlation with patient history and other diagnostic information is  necessary to determine patient infection status.  Positive results do  not rule out bacterial infection or co-infection with other viruses. If result is PRESUMPTIVE POSTIVE SARS-CoV-2 nucleic acids MAY BE PRESENT.   A presumptive positive result was obtained on the submitted specimen  and confirmed on repeat testing.  While 2019 novel coronavirus  (SARS-CoV-2) nucleic acids may be present in the submitted sample  additional confirmatory testing may be necessary for epidemiological  and / or clinical management purposes  to differentiate between  SARS-CoV-2 and other Sarbecovirus currently known to infect humans.  If clinically indicated additional testing with an alternate test  methodology [phone]) is advised. The SARS-CoV-2 RNA is generally  detectable in upper and lower respiratory sp ecimens during the acute  phase of infection. The expected result is Negative. Fact Sheet  for Patients:  BoilerBrush.com.cy Fact Sheet for Healthcare Providers: https://pope.com/ This test is not yet approved or cleared by the [place] FDA and has been authorized for detection and/or diagnosis of SARS-CoV-2 by FDA under an Emergency Use Authorization (EUA).  This EUA will remain in effect (meaning this test can be used) for the duration of the COVID-19 declaration under Section 564(b)(1)  of the Act, 21 U.S.C. section 360bbb-3(b)(1), unless the authorization is terminated or revoked sooner. Performed at [facility], [address] [place], [place] [address]   Culture, blood (routine x 2)     Status: None (Preliminary result)   Collection Time: 07/05/19  9:33 PM   Specimen: BLOOD  Result Value Ref Range Status   Specimen Description   Final    BLOOD LEFT ANTECUBITAL Performed at [facility], [address], [place], [place] [address]    Special Requests   Final    BOTTLES DRAWN AEROBIC AND ANAEROBIC Blood Culture adequate volume Performed at [facility], [address] [place], [place] [address]    Culture   Final    NO GROWTH 4 DAYS Performed at [facility], [address], [place], [place] [address]    Report Status PENDING  Incomplete  Culture, blood (routine x 2)     Status: None (Preliminary result)   Collection Time: 07/05/19  9:33 PM   Specimen: BLOOD  Result Value Ref Range Status   Specimen Description   Final    BLOOD RIGHT ANTECUBITAL Performed at [facility], [address], [place], [place] [address]    Special Requests   Final    BOTTLES DRAWN AEROBIC AND ANAEROBIC Blood Culture adequate volume Performed at [facility], [address], [place], [place] [address]    Culture   Final    NO GROWTH 4 DAYS Performed at [facility], [address] [place], [place] [address]    Report Status PENDING  Incomplete  MRSA PCR Screening     Status: None   Collection Time: 07/06/19 12:04 AM   Specimen: Nasal Mucosa; Nasopharyngeal  Result Value Ref Range Status   MRSA by PCR NEGATIVE NEGATIVE Final    Comment:        The GeneXpert MRSA Assay (FDA approved for NASAL specimens only), is one component of a comprehensive MRSA colonization surveillance program. It is not intended to diagnose MRSA infection nor to guide or monitor treatment  for MRSA infections. Performed at [facility], [address] [place], [place] [address]     Radiology Studies: US Renal  Result Date: 07/08/2019 CLINICAL DATA:  Acute urinary retention EXAM: RENAL / URINARY TRACT ULTRASOUND COMPLETE COMPARISON:  CT 07/05/2019 FINDINGS: Right Kidney: Renal measurements: 9.2 x 3.5 x 6.7 cm = volume: 78.7 mL . Echogenicity within normal limits. No mass or hydronephrosis visualized. Left Kidney: Renal measurements: 11.0 x 8.6 x 4.2 cm = volume: 205.1 mL. Moderate to severe left hydronephrosis is stable since prior CT compatible with chronic left UPJ obstruction. Normal echotexture. Bladder: Mild wall thickening measuring up to 8 mm. 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 Mild bladder wall thickening could be related to chronic bladder outlet obstruction or cystitis. Recommend clinical correlation. Electronically Signed   By: Charlett Nose M.D.   On: 07/08/2019 17:38   Dg Chest Port 1 View  Result Date: 07/08/2019 CLINICAL DATA:  Shortness of breath  EXAM: PORTABLE CHEST 1 VIEW COMPARISON:  07/05/2019 FINDINGS: Normal heart size and mediastinal contours. Granulomatous nodal calcifications in the left mediastinum. Mild interstitial crowding at the bases. There is no edema, consolidation, effusion, or pneumothorax. IMPRESSION: No evidence of active disease. Electronically Signed   By: Marnee Spring M.D.   On: 07/08/2019 11:04   Scheduled Meds: . aspirin EC  325 mg Oral Q breakfast  . docusate sodium  100 mg Oral BID  . donepezil  5 mg Oral Daily  . guaiFENesin  600 mg Oral BID  . iron polysaccharides  150 mg Oral Daily  . levothyroxine  88 mcg Oral QAC breakfast  . lubiprostone  24 mcg Oral BID WC  . Melatonin  3 mg Oral QHS  . memantine  28 mg Oral Daily  . mirabegron ER  25 mg Oral Q24H  . traZODone  25 mg Oral QHS   Continuous Infusions: . dextrose 75 mL/hr at 07/09/19 1058    LOS:  4 days   Merlene Laughter, DO [facility] PAGER is on AMION  If 7PM-7AM, please contact night-coverage [email or web address] Password [facility] 07/09/2019, 4:03 PM

## 2019-07-10 LAB — COMPREHENSIVE METABOLIC PANEL
ALT: 21 U/L (ref 0–44)
AST: 34 U/L (ref 15–41)
Albumin: 2.2 g/dL — ABNORMAL LOW (ref 3.5–5.0)
Alkaline Phosphatase: 53 U/L (ref 38–126)
Anion gap: 6 (ref 5–15)
BUN: 10 mg/dL (ref 6–20)
CO2: 24 mmol/L (ref 22–32)
Calcium: 7.8 mg/dL — ABNORMAL LOW (ref 8.9–10.3)
Chloride: 110 mmol/L (ref 98–111)
Creatinine, Ser: 0.88 mg/dL (ref 0.61–1.24)
GFR calc Af Amer: >60 mL/min (ref >60)
GFR calc non Af Amer: >60 mL/min (ref >60)
Glucose, Bld: 109 mg/dL — ABNORMAL HIGH (ref 70–99)
Potassium: 3.1 mmol/L — ABNORMAL LOW (ref 3.5–5.1)
Sodium: 140 mmol/L (ref 135–145)
Total Bilirubin: 0.9 mg/dL (ref 0.3–1.2)
Total Protein: 5.1 g/dL — ABNORMAL LOW (ref 6.5–8.1)

## 2019-07-10 LAB — MAGNESIUM: Magnesium: 2.1 mg/dL (ref 1.7–2.4)

## 2019-07-10 LAB — CBC WITH DIFFERENTIAL/PLATELET
Abs Immature Granulocytes: 0.03 10*3/uL (ref 0.00–0.07)
Basophils Absolute: 0.1 10*3/uL (ref 0.0–0.1)
Basophils Relative: 1 %
Eosinophils Absolute: 0.3 10*3/uL (ref 0.0–0.5)
Eosinophils Relative: 5 %
HCT: 22.9 % — ABNORMAL LOW (ref 39.0–52.0)
Hemoglobin: 7.3 g/dL — ABNORMAL LOW (ref 13.0–17.0)
Immature Granulocytes: 0 %
Lymphocytes Relative: 11 %
Lymphs Abs: 0.7 10*3/uL (ref 0.7–4.0)
MCH: 34.1 pg — ABNORMAL HIGH (ref 26.0–34.0)
MCHC: 31.9 g/dL (ref 30.0–36.0)
MCV: 107 fL — ABNORMAL HIGH (ref 80.0–100.0)
Monocytes Absolute: 0.8 10*3/uL (ref 0.1–1.0)
Monocytes Relative: 11 %
Neutro Abs: 4.9 10*3/uL (ref 1.7–7.7)
Neutrophils Relative %: 72 %
Platelets: 202 10*3/uL (ref 150–400)
RBC: 2.14 MIL/uL — ABNORMAL LOW (ref 4.22–5.81)
RDW: 13.8 % (ref 11.5–15.5)
WBC: 6.8 10*3/uL (ref 4.0–10.5)
nRBC: 0.3 % — ABNORMAL HIGH (ref 0.0–0.2)

## 2019-07-10 LAB — PHOSPHORUS: Phosphorus: 2.8 mg/dL (ref 2.5–4.6)

## 2019-07-10 LAB — CULTURE, BLOOD (ROUTINE X 2)
Culture: NO GROWTH
Culture: NO GROWTH
Special Requests: ADEQUATE
Special Requests: ADEQUATE

## 2019-07-10 LAB — GLUCOSE, CAPILLARY
Glucose-Capillary: 115 mg/dL — ABNORMAL HIGH (ref 70–99)
Glucose-Capillary: 96 mg/dL (ref 70–99)
Glucose-Capillary: 111 mg/dL — ABNORMAL HIGH (ref 70–99)

## 2019-07-10 MED ORDER — POTASSIUM CHLORIDE 10 MEQ/100ML IV SOLN: 10.0000 meq | INTRAVENOUS | Status: DC

## 2019-07-10 MED ORDER — POTASSIUM CHLORIDE CRYS ER 20 MEQ PO TBCR
40.0000 meq | EXTENDED_RELEASE_TABLET | Freq: Two times a day (BID) | ORAL | Status: AC
  Administered 2019-07-10 (×2): 40 meq via ORAL
  Filled 2019-07-10 (×2): qty 2

## 2019-07-10 MED ORDER — TRAMADOL HCL 50 MG PO TABS
50.0000 mg | ORAL_TABLET | Freq: Two times a day (BID) | ORAL | Status: DC | PRN
  Administered 2019-07-10 – 2019-07-12 (×4): 50 mg via ORAL
  Filled 2019-07-10 (×4): qty 1

## 2019-07-10 MED ORDER — SODIUM CHLORIDE 0.9% IV SOLUTION
Freq: Once | INTRAVENOUS | Status: AC
  Administered 2019-07-10: 11:00:00 via INTRAVENOUS

## 2019-07-10 NOTE — TOC Progression Note (Signed)
Transition of Care [facility]) - Progression Note    Patient Details  Name: Wai Litt MRN: [id number] Date of Birth: 11/07/1960  Transition of Care [facility]) CM/SW [place], LCSW Phone Number: 07/10/2019, 2:51 PM  Clinical Narrative:   CSW spoke with pt's sister Anwar Sakata and confirmed pt's family's plan for pt to be discharged to SNF for rehab at discharge.  CSW provided active listening and validated pt's family's concerns.   CSW was given verbal permission from pt's family to complete FL-2 and send referrals out to SNF facilities in the [employer] via the hub per pt's request. Pt has been living in a [facility] home, prior to being admitted to [facility].  [name] had concerns about pt's group home and hopes that APS will determine if any corrections need to be made for safety at the group home (if any) and that there will be no delay in pt returning once pt is D/C'd from the SNF.     Expected Discharge Plan: Skilled Nursing Facility Barriers to Discharge: Continued Medical Work up  Expected Discharge Plan and Services Expected Discharge Plan: [place]         Expected Discharge Date: 07/07/19                                     Social Determinants of Health (SDOH) Interventions    Readmission Risk Interventions No flowsheet data found.

## 2019-07-10 NOTE — Progress Notes (Signed)
CSW attempted PASRR but screen is "still running".  MUST ID # [id number]  CSW will continue to follow for D/C needs.  Alphonse Guild. Lasonya Hubner, LCSW, LCAS, CSI Transitions of Care Clinical Social Worker Care Coordination Department Ph: [phone]

## 2019-07-10 NOTE — Progress Notes (Addendum)
CSW spoke with pt's sister Smaran Gaus and confirmed pt's family's plan for pt to be discharged to SNF for rehab at discharge.  CSW provided active listening and validated pt's family's concerns.   CSW was given verbal permission from pt's family to complete FL-2 and send referrals out to SNF facilities in the [place] via the hub per pt's request.  Pt has been living in a [facility] home, prior to being admitted to [facility].  [name] had concerns about pt's group home and hopes that APS will determine if any corrections need to be made for safety at the group home (if any) and that there will be no delay in pt returning once pt is D/C'd from the SNF.  CSW will continue to follow for D/C needs.  Alphonse Guild. Talishia Betzler, LCSW, LCAS, CSI Transitions of Care Clinical Social Worker Care Coordination Department Ph: [phone]

## 2019-07-10 NOTE — Progress Notes (Signed)
PROGRESS NOTE    Brian Stanton  RUE:[id number] DOB: May 29, 1961 DOA: 07/05/2019 PCP: Default, Provider, MD   Brief Narrative:  HPI per Dr. [name] on 07/05/2019 Brian Stanton is a 58 y.o. male with history of Down syndrome, intellectual disability, dementia, hypothyroidism who as per the report he is usually ambulatory was found to be not wanting to walk this morning and was complaining of knee pain.  The exact site of the right knee pain was not sure.  Given the symptoms patient was brought to the ER.  Patient's mother was unable to provide any more history at this time.  ED Course: In the ER patient had CT scan of the head and neck and abdomen.  Shows bilateral hip fracture the right one looks chronic in the left on those acute.  On-call orthopedic surgeon Dr. [name] was consulted and is planning to have surgery tomorrow.  CAT scan of the abdomen also shows features concerning for pneumonia.  And also shows chronic left UPJ obstruction and bladder wall thickening.  UA was unremarkable.  CT of the head and C-spine shows large fat-containing soft tissue in the posterior part of the neck which will need further work-up differentials include lipoma versus liposarcoma.  Patient admitted for further management of hip fracture.  **Interim History  Patient had some urinary retention so had to have a Foley catheter placed and this was removed today after surgical intervention.  Continues to have some agitation and is wearing safety mittens.  Underwent surgical intervention yesterday and had a left Biomet now short trochanteric nail with interlocking and the right hip bipolar hemiarthroplasty done by Dr. [name].  Postoperatively he has had acute anemia and hemoglobin is trending down we will need to further evaluate the need for any blood products and Hgb/Hct has slowed dropping and is now 7.5/23.8.   Assessment & Plan:   Active Problems:   Closed intertrochanteric fracture of hip, left, initial  encounter (HCC)   Closed displaced fracture of right femoral neck (HCC)   Hip fracture (HCC)   Hypothyroidism   Closed nondisplaced intertrochanteric fracture of left femur (HCC)   Anemia due to acute blood loss   Bilateral hip fracture with Acute Left Inertrochanteric status post a left trochanteric nail with interlocking as well as a right hip bipolar hemiarthroplasty postoperative day 4 -The left one appears to be acute as per the orthopedic surgeon and the right one looks chronic plan is to get surgery done on the left hip at this time -Patient is status post surgical intervention with intramedullary nail intratrochanteric on the left and total right arthroplasty bipolar hip her right -Orthopedic surgery recommending advancing diet up with therapy and discontinue IV fluids as well as discharge to SNF when bed is available.  -Pain relief medications with IV morphine 0.5 mg every 2 as needed for severe pain and was given 1 dose of 4 mg once yesterday; added acetaminophen 650 mg p.o. every 6 PRN for mild pain and I have added as needed tramadol 50 mg every 12 PRN for moderate pain -Orthopedic surgery has added aspirin 325 mg p.o. daily with breakfast but may need to hold given that the patient's Hgb is dropping  -Patient will be at moderate risk for intermediate risk procedure. -PT/OT recommending SNF and unfortunately patient cannot go to his group home because of his level of acuity and will have the case manager and TOC work on finding a SNF placement prior to him returning to his group home given  his acuity of care  Suspected Pneumonia with Sepsis ruled out; Likely was Inflammatory -Given the elevated lactate though patient is presently hemodynamically stable.  Did have an elevated procalcitonin level 5.18 -He was placed on the antibiotics with azithromycin and ceftriaxone as well as IV fluids with D5 normal saline at a rate of 50 mL's per hour for 24 hours; Will Stop Abx as repeat CXR is  improved  -CT scan of the chest showed patchy airspace opacities in the medial basal segment of the right lower lobe which are new since prior study probably infectious versus inflammatory and questionable if patient has aspirated -Repeat chest x-ray this AM showed "Normal heart size and mediastinal contours. Granulomatous nodal calcifications in the left mediastinum. Mild interstitial crowding at the bases. There is no edema, consolidation, effusion, or pneumothorax." -Lactic acid level was elevated at 4.3 is now trended down to 1.8 -Procalcitonin level was 5.18 and not repeated this morning -WBC trending down from 13.8 -> 7.3 -> 9.5 -Does not appear to be septic as he is afebrile has no tachypnea or tachycardia,  and leukocytosis likely secondary to his hip fracture and trending down -Blood cultures x2 showed no growth to date at 5 Days  -Repeat CBC in the a.m. and repeat chest x-ray if necessary -Started the patient on guaifenesin 600 mg p.o. twice daily and will continue  Hypothyroidism  -TSH was 4.592 however free T4 was normal at 1.10 -Resumed Home Levothyroxine 88 mcg  History of dementia and intellect disability with history of Down syndrome  -On Donepezil 5 mg po Daily, Trazodone 25 mg po qHS, and Memantine 28 mg po Daily. -SLP done and they are recommending honey thick liquids with dysphagia 1 diet which is pure  Soft tissue swelling in the posterior aspect of the neck concerning for lipoma versus liposarcoma  -Will need further work-up as outpatient.  Chronic left UPJ obstruction  -UA unremarkable.  -Urinalysis showed clear appearance with yellow color urine and negative ketones negative leukocytes -CT Abdomen and Pelvis showed "Chronic left UPJ obstruction. Chronic diffuse thickening of the wall of the urinary bladder." -Obtain a renal ultrasound and showed "Stable moderate to severe left hydronephrosis related to chronic UPJ obstruction. Mild bladder wall thickening could be  related to chronic bladder outlet obstruction or cystitis." -Outpatient Urology follow up   Acute Urinary Retention, improved  -Urinalysis was unremarkable and urine culture showed NGTD  -C/w Mirabergron ER 25 mg po q24h -Bladder scan showed 542 mL's and because patient was going for surgical intervention a Foley catheter was placed and now removed post-operatively -Will obtain a Renal ultrasound now and showed "Stable moderate to severe left hydronephrosis related to chronic UPJ obstruction. Mild bladder wall thickening could be related to chronic bladder outlet obstruction or cystitis." -Will need to bladder scan every shift  Poor urine output, improved -Patient had 125 mL's of output all day yesterday per nursing staff -Bladder scan done yesterday showed values of 222 and repeat was 163 -Started on IVF yesterday and given a bolus of NS of 500 mL/hr and started on Maintenance IVF with NS at 75 mL/hr and changed to D5W this AM given Hypernatremia/Hyperchloremia  Elevated AST -Mild as AST was 45 and trended down to 34 -Continue to Monitor and Trend and repeat CMP in AM   Hypophosphatemia -Patient's Phos Level was 2.8 this AM -Continue to Monitor and Replete as Necessary -Repeat Phos Level in AM   Acute Postoperative Blood Loss Anemia/Microcytic Anemia -Patient's hemoglobin/hematocrit went from  13.7/41.6 likely had a dilutional drop with IV fluid hydration and now is down to 7.3/22.9 -Likely postoperative drop given his surgeries; will type and screen and transfuse 1 unit of PRBCs today given continued dropping -Continue to monitor for signs and symptoms of bleeding; currently no overt bleeding noted but will obtain an FOBT -Patient was started on Iron Polysaccharides 150 mg p.o. daily and Will give a dose of Ferumoxytol 510 mg IV x1 today  -Checked Anemia Panel and showed iron level of 12, U IBC 127, TIBC of 139, saturation ratio of 9%, ferritin level of 178, folate level 17.8, and  vitamin B12 level of 314 -Transfusion if hemoglobin drops less than 7.5 -Repeat CBC in a.m.  Thrombocytopenia, improved  -Patient platelet count went from 232 and trended down to 142 and in the setting of postoperative bleeding and surgery; Now is 202 -Continue to monitor for signs and symptoms of bleeding; currently no overt bleeding noted -Repeat CBC in a.m.   Hypokalemia, improved  -The patient's potassium is now 3.1 -Replete with p.o. potassium chloride 40 mg twice daily x2 doses -Continue to monitor and replete as necessary -Repeat CMP in a.m.  Hypernatremia/Hyperchloremia -Patient's Na+ was 146 and Chloride was 116; now improved and now sodium is 140 and chloride is now 110 -IVF were changed to D5W at 75 mL/hr this AM and now stopped by Ortho -Continue to Monitor and Repeat CMP in AM   DVT prophylaxis: SCDs with pharmacological prophylaxis per orthopedic surgery Code Status: FULL CODE Family Communication: No family present at bedside  Disposition Plan: SNF when bed is available and patient needs PASRR  Consultants:   Orthopedic Surgery Dr. Ophelia CharterYates   Procedures:  Left Biomet affixes 9 mm short trochanteric nail with interlock.  95 mm lag screw.  Right hip bipolar hemiarthroplasty, cemented stem.  43 mm ball. Depuy   Antimicrobials:  Anti-infectives (From admission, onward)   Start     Dose/Rate Route Frequency Ordered Stop   07/08/19 2200  azithromycin (ZITHROMAX) tablet 500 mg  Status:  Discontinued     500 mg Oral Daily at bedtime 07/08/19 1050 07/08/19 1647   07/06/19 1730  ceFAZolin (ANCEF) IVPB 2g/100 mL premix     2 g 200 mL/hr over 30 Minutes Intravenous On call to O.R. 07/06/19 1705 07/06/19 1831   07/06/19 1713  ceFAZolin (ANCEF) 2-4 GM/100ML-% IVPB    Note to Pharmacy: Sharyn Creamerooper, Judy   : cabinet override      07/06/19 1713 07/06/19 1816   07/05/19 2200  cefTRIAXone (ROCEPHIN) 2 g in sodium chloride 0.9 % 100 mL IVPB  Status:  Discontinued     2 g 200  mL/hr over 30 Minutes Intravenous Every 24 hours 07/05/19 2100 07/08/19 1647   07/05/19 2200  azithromycin (ZITHROMAX) 500 mg in sodium chloride 0.9 % 250 mL IVPB  Status:  Discontinued     500 mg 250 mL/hr over 60 Minutes Intravenous Every 24 hours 07/05/19 2100 07/08/19 1050     Subjective: Patient is a thin pleasantly demented Caucasian male with intellectual disability and Down syndrome who again does not respond to questioning and does not follow commands.  Still wearing soft mitten restraints currently.  Nursing states that he winces in pain when they turn him.  Nurse was concerned about his swallowing today given that he coughed yesterday with pills and SLP was ordered and they recommended honey thick liquids with dysphagia 1 diet.  Objective: Vitals:   07/10/19 1233 07/10/19 1251 07/10/19 1252  07/10/19 1549  BP: 99/61 (!) 108/59 (!) 108/59 116/72  Pulse: 64 66 66 67  Resp: Temp: 98.8 F (37.1 C) 98.6 F (37 C) 98.6 F (37 C) 98.2 F (36.8 C)  TempSrc: Oral Axillary Oral Oral  SpO2: 99% 100% 100% 100%  Weight:      Height:        Intake/Output Summary (Last 24 hours) at 07/10/2019 1752 Last data filed at 07/10/2019 1520 Gross per 24 hour  Intake 522.5 ml  Output 400 ml  Net 122.5 ml   Filed Weights   07/09/19 1044  Weight: 59 kg   Examination: Physical Exam:  Constitutional: Thin Caucasian male with intellectual disability and Down syndrome in no acute distress appears mildly anxious and is nonverbal to me, Eyes: Lids and conjunctivae normal, sclerae anicteric  ENMT: External Ears, Nose appear normal. Grossly normal hearing.  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 Normal respiratory effort and patient is not tachypenic. No accessory muscle use.   Unlabored breathing and is not wearing supplemental oxygen via nasal cannula Cardiovascular: RRR, no murmurs / rubs / gallops. S1 and S2 auscultated. No extremity edema.  Abdomen: Soft, non-tender, non-distended. Bowel sounds positive x4.  GU: Deferred. Musculoskeletal: No clubbing / cyanosis of digits/nails. No joint deformity upper and lower extremities.  Skin: No rashes, lesions, ulcers or limited skin evaluation and incisions and hips appear clean dry and intact. No induration; Warm and dry.  Neurologic: Continues to not follow commands but does move leg spontaneously and wearing soft safety mittens Psychiatric: Impaired judgment and insight. Alert an awake.  Mildly anxious mood and appropriate affect.   Data Reviewed: I have personally reviewed following labs and imaging studies  CBC: Recent Labs  Lab 07/05/19 1353 07/06/19 0031  07/07/19 0500 07/07/19 1413 07/08/19 0429 07/09/19 0446 07/10/19 0358  WBC 13.8* 11.8*  --  9.4  --  7.3 9.5 6.8  NEUTROABS 12.5*  --   --  8.2*  --  5.0 8.0* 4.9  HGB 15.2 13.7   < > 8.5* 8.0* 7.6* 7.5* 7.3*  HCT 46.0 41.6   < > 26.0* 24.5* 22.8* 23.8* 22.9*  MCV 103.8* 104.0*  --  106.6*  --  106.5* 107.7* 107.0*  PLT 270 232  --  143*  --  142* 192 202   < > = values in this interval not displayed.   Basic Metabolic Panel: Recent Labs  Lab 07/06/19 0031 07/06/19 2125 07/07/19 0500 07/08/19 0429 07/09/19 0446 07/10/19 0358  NA 140 142 141 142 146* 140  K 4.5 4.1 3.8 3.4* 3.7 3.1*  CL 105  --  110 110 116* 110  CO2 26  --  GLUCOSE 143*  --  140* 91 110* 109*  BUN 21*  --  CREATININE 1.21  --  1.08 1.07 0.86 0.88  CALCIUM 8.6*  --  7.8* 8.0* 8.0* 7.8*  MG  --   --  2.1 2.1 2.3 2.1  PHOS  --   --  1.9* 2.8 2.5 2.8   GFR: Estimated Creatinine Clearance: 66.7 mL/min (by C-G formula based on SCr of 0.88 mg/dL). Liver Function Tests: Recent Labs  Lab 07/05/19 1353 07/07/19 0500 07/08/19 0429 07/09/19 0446  07/10/19 0358  AST 45* 26 26  31 34  ALT ALKPHOS 62 37* 39 53 53  BILITOT 1.0 0.7 0.8 0.9 0.9  PROT 7.7 5.4* 5.1* 5.4* 5.1*  ALBUMIN 3.6 2.8* 2.5* 2.4* 2.2*   No results for input(s): LIPASE, AMYLASE in the last 168 hours. No results for input(s): AMMONIA in the last 168 hours. Coagulation Profile: No results for input(s): INR, PROTIME in the last 168 hours. Cardiac Enzymes: Recent Labs  Lab 07/05/19 1353  CKTOTAL 791*   BNP (last 3 results) No results for input(s): PROBNP in the last 8760 hours. HbA1C: No results for input(s): HGBA1C in the last 72 hours. CBG: Recent Labs  Lab 07/09/19 1126 07/09/19 1739 07/09/19 2352 07/10/19 0638 07/10/19 1246  GLUCAP 117* 101* 96 115* 96   Lipid Profile: No results for input(s): CHOL, HDL, LDLCALC, TRIG, CHOLHDL, LDLDIRECT in the last 72 hours. Thyroid Function Tests: No results for input(s): TSH, T4TOTAL, FREET4, T3FREE, THYROIDAB in the last 72 hours. Anemia Panel: Recent Labs    07/08/19 0429 07/08/19 0430  VITAMINB12  --  314  FOLATE  --  17.8  FERRITIN  --  178  TIBC  --  139*  IRON  --  12*  RETICCTPCT 1.4  --    Sepsis Labs: Recent Labs  Lab 07/05/19 1342 07/05/19 2133 07/06/19 0031  PROCALCITON  --  5.18  --   LATICACIDVEN 4.3* 3.2* 1.8    Recent Results (from the past 240 hour(s))  Urine culture     Status: None   Collection Time: 07/05/19  1:38 PM   Specimen: Urine, Random  Result Value Ref Range Status   Specimen Description   Final    URINE, RANDOM Performed at [facility], [address] [place], [place] [address]    Special Requests   Final    NONE Performed at [facility], [address] [place], [place] [address]    Culture   Final    NO GROWTH Performed at [facility], [address] [place], [place] [address]    Report Status 07/06/2019 FINAL  Final  SARS Coronavirus 2 [facility] order, Performed in [facility] hospital  lab) Nasopharyngeal Urine, Catheterized     Status: None   Collection Time: 07/05/19  1:41 PM   Specimen: Urine, Catheterized; Nasopharyngeal  Result Value Ref Range Status   SARS Coronavirus 2 NEGATIVE NEGATIVE Final    Comment: (NOTE) If result is NEGATIVE SARS-CoV-2 target nucleic acids are NOT DETECTED. The SARS-CoV-2 RNA is generally detectable in upper and lower  respiratory specimens during the acute phase of infection. The lowest  concentration of SARS-CoV-2 viral copies this assay can detect is 250  copies / mL. A negative result does not preclude SARS-CoV-2 infection  and should not be used as the sole basis for treatment or other  patient management decisions.  A negative result may occur with  improper specimen collection / handling, submission of specimen other  than nasopharyngeal swab, presence of viral mutation(s) within the  areas targeted by this assay, and inadequate number of viral copies  (<250 copies / mL). A negative result must be combined with clinical  observations, patient history, and epidemiological information. If result is POSITIVE SARS-CoV-2 target nucleic acids are DETECTED. The SARS-CoV-2 RNA is generally detectable in upper and lower  respiratory specimens dur ing the acute phase of infection.  Positive  results are indicative of active infection with SARS-CoV-2.  Clinical  correlation with  patient history and other diagnostic information is  necessary to determine patient infection status.  Positive results do  not rule out bacterial infection or co-infection with other viruses. If result is PRESUMPTIVE POSTIVE SARS-CoV-2 nucleic acids MAY BE PRESENT.   A presumptive positive result was obtained on the submitted specimen  and confirmed on repeat testing.  While 2019 novel coronavirus  (SARS-CoV-2) nucleic acids may be present in the submitted sample  additional confirmatory testing may be necessary for epidemiological  and / or clinical management  purposes  to differentiate between  SARS-CoV-2 and other Sarbecovirus currently known to infect humans.  If clinically indicated additional testing with an alternate test  methodology [phone]) is advised. The SARS-CoV-2 RNA is generally  detectable in upper and lower respiratory sp ecimens during the acute  phase of infection. The expected result is Negative. Fact Sheet for Patients:  StrictlyIdeas.no Fact Sheet for Healthcare Providers: BankingDealers.co.za This test is not yet approved or cleared by the [place] FDA and has been authorized for detection and/or diagnosis of SARS-CoV-2 by FDA under an Emergency Use Authorization (EUA).  This EUA will remain in effect (meaning this test can be used) for the duration of the COVID-19 declaration under Section 564(b)(1) of the Act, 21 U.S.C. section 360bbb-3(b)(1), unless the authorization is terminated or revoked sooner. Performed at [facility], [place] [address], [place], [place] [address]   Culture, blood (routine x 2)     Status: None   Collection Time: 07/05/19  9:33 PM   Specimen: BLOOD  Result Value Ref Range Status   Specimen Description   Final    BLOOD LEFT ANTECUBITAL Performed at [place] [address], [place], [place] [address]    Special Requests   Final    BOTTLES DRAWN AEROBIC AND ANAEROBIC Blood Culture adequate volume Performed at [place] [address] [place], [place] [address]    Culture   Final    NO GROWTH 5 DAYS Performed at [facility], [place] [address], [place], [place] [address]    Report Status 07/10/2019 FINAL  Final  Culture, blood (routine x 2)     Status: None   Collection Time: 07/05/19  9:33 PM   Specimen: BLOOD  Result Value Ref Range Status   Specimen Description   Final    BLOOD RIGHT ANTECUBITAL Performed at [place] [address],  [place], [place] [address]    Special Requests   Final    BOTTLES DRAWN AEROBIC AND ANAEROBIC Blood Culture adequate volume Performed at [place] [address], [place], [place] [address]    Culture   Final    NO GROWTH 5 DAYS Performed at [facility], [place] [address] [place], [place] [address]    Report Status 07/10/2019 FINAL  Final  MRSA PCR Screening     Status: None   Collection Time: 07/06/19 12:04 AM   Specimen: Nasal Mucosa; Nasopharyngeal  Result Value Ref Range Status   MRSA by PCR NEGATIVE NEGATIVE Final    Comment:        The GeneXpert MRSA Assay (FDA approved for NASAL specimens only), is one component of a comprehensive MRSA colonization surveillance program. It is not intended to diagnose MRSA infection nor to guide or monitor treatment for MRSA infections. Performed at [facility], [place] [address], [place],  [address]     Radiology Studies: No results found. Scheduled  Meds:  aspirin EC  325 mg Oral Q breakfast   docusate sodium  100 mg Oral BID   donepezil  5 mg Oral Daily   guaiFENesin  600 mg Oral BID   iron polysaccharides  150 mg Oral Daily   levothyroxine  88 mcg Oral QAC breakfast   lubiprostone  24 mcg Oral BID WC   Melatonin  3 mg Oral QHS   memantine  28 mg Oral Daily   mirabegron ER  25 mg Oral Q24H   potassium chloride  40 mEq Oral BID   traZODone  25 mg Oral QHS   Continuous Infusions:  dextrose Stopped (07/10/19 1049)    LOS: 5 days   Merlene Laughter, DO [facility] PAGER is on AMION  If 7PM-7AM, please contact night-coverage [email or web address] Password [facility] 07/10/2019, 5:52 PM

## 2019-07-10 NOTE — Progress Notes (Signed)
     Subjective: 4 Days Post-Op Procedure(s) (LRB): INTRAMEDULLARY (IM) NAIL INTERTROCHANTRIC (Left) ARTHROPLASTY BIPOLAR HIP (HEMIARTHROPLASTY) (Right) Awake,alert non conversant due to dementia. Down's syndrome.   Patient reports pain as mild.    Objective:   VITALS:  Temp:  [97.9 F (36.6 C)-100 F (37.8 C)] 97.9 F (36.6 C) (09/27 0642) Pulse Rate:  [63-76] 63 (09/27 0642) Resp:  [18] 18 (09/27 0642) BP: (118-139)/(59-127) 139/127 (09/27 0642) SpO2:  [97 %-98 %] 98 % (09/27 5038)  Neurologically intact ABD soft Neurovascular intact Sensation intact distally Intact pulses distally Dorsiflexion/Plantar flexion intact Incision: no drainage and dressings both hips were changed. The incisions are dry no erythrema or drainage.  No cellulitis present Compartment soft   LABS Recent Labs    07/07/19 1413  07/08/19 0429 07/09/19 0446 07/10/19 0358  HGB 8.0*  --  7.6* 7.5* 7.3*  WBC  --    < > 7.3 9.5 6.8  PLT  --    < > 142* 192 202   < > = values in this interval not displayed.   Recent Labs    07/09/19 0446 07/10/19 0358  NA 146* 140  K 3.7 3.1*  CL 116* 110  CO2 25 24  BUN 15 10  CREATININE 0.86 0.88  GLUCOSE 110* 109*   No results for input(s): LABPT, INR in the last 72 hours.   Assessment/Plan: 4 Days Post-Op Procedure(s) (LRB): INTRAMEDULLARY (IM) NAIL INTERTROCHANTRIC (Left) ARTHROPLASTY BIPOLAR HIP (HEMIARTHROPLASTY) (Right)  Advance diet Up with therapy Discharge to SNF  [name] 07/10/2019, 11:22 AMPatient ID: Brian Stanton, male   DOB: 1961/02/04, 58 y.o.   MRN: [id number]

## 2019-07-10 NOTE — Evaluation (Signed)
Clinical/Bedside Swallow Evaluation Patient Details  Name: Brian Stanton MRN: [id number] Date of Birth: 29-Dec-1960  Today's Date: 07/10/2019 Time: SLP Start Time (ACUTE ONLY): 1500 SLP Stop Time (ACUTE ONLY): 1521 SLP Time Calculation (min) (ACUTE ONLY): 21 min  Past Medical History:  Past Medical History:  Diagnosis Date  . Down's syndrome   . GERD (gastroesophageal reflux disease)   . Mental retardation    Past Surgical History:  Past Surgical History:  Procedure Laterality Date  . HIP ARTHROPLASTY Right 07/06/2019   Procedure: ARTHROPLASTY BIPOLAR HIP (HEMIARTHROPLASTY);  Surgeon: [name], MD;  Location: [facility];  Service: Orthopedics;  Laterality: Right;  . INTRAMEDULLARY (IM) NAIL INTERTROCHANTERIC Left 07/06/2019   Procedure: INTRAMEDULLARY (IM) NAIL INTERTROCHANTRIC;  Surgeon: [name], MD;  Location: [facility];  Service: Orthopedics;  Laterality: Left;   HPI:  Brian Stanton is a 58 y.o. male with history of Down syndrome, intellectual disability, dementia, hypothyroidism who as per the report he is usually ambulatory was found to be not wanting to walk and was complaining of knee pain.  CT shows  bilateral hip fracture the right one looks chronic in the left on those acute. Underwent ORIF 9/23. CT of abdomen on admission concerning for pna, CXR on 9/25 clear. Pt has a history of dysphagia and MBS in 2016 recommended limiting bolus size vs using nectar thick liquids.     Assessment / Plan / Recommendation Clinical Impression  Pt demonstrates impaired oral phase often present with developmental disability including decreased labial seal and lingual thrusting for oral transit. Pt was unable to seal lips to cup or to sip from straw. Only able to give liquids via spoon. Pt obviously prefers sweet flavors. Given oral dyspahgia pt also demonstrated delayed initaite of swallow response during ongoing tongue thrusting. Best texture was honey thick, nectar resulted in some delayed coughing.  ALso tolerates puree well. Will need careful hand feeding, discussed with RN. Will f/u for tolerance.  SLP Visit Diagnosis: Dysphagia, unspecified (R13.10)    Aspiration Risk  Moderate aspiration risk;Risk for inadequate nutrition/hydration    Diet Recommendation Honey-thick liquid;Dysphagia 1 (Puree)   Liquid Administration via: Spoon Supervision: Full supervision/cueing for compensatory strategies Compensations: Slow rate;Small sips/bites Postural Changes: Seated upright at 90 degrees    Other  Recommendations Oral Care Recommendations: Oral care BID   Follow up Recommendations 24 hour supervision/assistance      Frequency and Duration min 2x/week  2 weeks       Prognosis Prognosis for Safe Diet Advancement: Fair Barriers to Reach Goals: Cognitive deficits;Severity of deficits      Swallow Study   General HPI: Brian Stanton is a 58 y.o. male with history of Down syndrome, intellectual disability, dementia, hypothyroidism who as per the report he is usually ambulatory was found to be not wanting to walk and was complaining of knee pain.  CT shows  bilateral hip fracture the right one looks chronic in the left on those acute. Underwent ORIF 9/23. CT of abdomen on admission concerning for pna, CXR on 9/25 clear. Pt has a history of dysphagia and MBS in 2016 recommended limiting bolus size vs using nectar thick liquids.   Type of Study: Bedside Swallow Evaluation Diet Prior to this Study: Dysphagia 1 (puree);Thin liquids Temperature Spikes Noted: No Respiratory Status: Room air History of Recent Intubation: No Behavior/Cognition: Alert;Cooperative Oral Cavity Assessment: Within Functional Limits Oral Care Completed by SLP: No Oral Cavity - Dentition: Missing dentition;Poor condition Self-Feeding Abilities: Total assist Patient Positioning:  Upright in bed Baseline Vocal Quality: Not observed Volitional Cough: Cognitively unable to elicit Volitional Swallow: Unable to elicit     Oral/Motor/Sensory Function Overall Oral Motor/Sensory Function: Severe impairment(no labial seal, thrusting behavior)   Ice Chips Ice chips: Not tested   Thin Liquid Thin Liquid: Not tested    Nectar Thick Nectar Thick Liquid: Impaired Presentation: Spoon Oral Phase Impairments: Reduced labial seal;Reduced lingual movement/coordination Pharyngeal Phase Impairments: Cough - Delayed;Suspected delayed Swallow   Honey Thick Honey Thick Liquid: Impaired Presentation: Spoon Oral Phase Impairments: Reduced labial seal Oral Phase Functional Implications: Prolonged oral transit   Puree Puree: Impaired Presentation: Spoon Oral Phase Functional Implications: Prolonged oral transit   Solid     Solid: Not tested     Herbie [place], MA CCC-SLP  Acute Rehabilitation Services Pager [phone] Office [phone]  Lynann Beaver 07/10/2019,3:28 PM

## 2019-07-10 NOTE — NC FL2 (Signed)
[place] LEVEL OF CARE SCREENING TOOL     IDENTIFICATION  Patient Name: Brian Stanton Birthdate: [birth year] Sex: male Admission Date (Current Location): 07/05/2019  [place] and [place] Number:  Kathleen Argue [id number] [place] and Address:  [facility],  [address] [place], [place]      Provider Number: [id number]  Attending Physician Name and Address:  Kerney Elbe, DO  Relative Name and Phone Number:       Current Level of Care: Hospital Recommended Level of Care: [place] Prior Approval Number:    Date Approved/Denied:   PASRR Number:    Discharge Plan: SNF    Current Diagnoses: Patient Active Problem List   Diagnosis Date Noted  . Anemia due to acute blood loss 07/09/2019    Class: Acute  . Closed intertrochanteric fracture of hip, left, initial encounter ([place]) 07/05/2019  . Closed displaced fracture of right femoral neck ([place]) 07/05/2019  . Hip fracture ([place]) 07/05/2019  . Hypothyroidism 07/05/2019  . Closed nondisplaced intertrochanteric fracture of left femur ([place]) 07/05/2019    Orientation RESPIRATION BLADDER Height & Weight     Self    Incontinent Weight: 130 lb 1.1 oz (59 kg) Height:  4\' 11"  (149.9 cm)  BEHAVIORAL SYMPTOMS/MOOD NEUROLOGICAL BOWEL NUTRITION STATUS      Incontinent Diet(Soft diet due to swallowing precautions, a speech evaluation is to take place)  AMBULATORY STATUS COMMUNICATION OF NEEDS Skin   Total Care Non-Verbally (2 bi-lateral hip surgeries, resulting in staples in hip area)                       Personal Care Assistance Level of Assistance  Bathing, Feeding, Dressing Bathing Assistance: Maximum assistance Feeding assistance: Maximum assistance Dressing Assistance: Maximum assistance     Functional Limitations Info             SPECIAL CARE FACTORS FREQUENCY  PT (By licensed PT), OT (By licensed OT)     PT Frequency: 5 OT Frequency: 5             Contractures Contractures Info: Not present    Additional Factors Info  Code Status, Allergies Code Status Info: FULL Allergies Info: No Known Allergies           Current Medications (07/10/2019):  This is the current hospital active medication list Current Facility-Administered Medications  Medication Dose Route Frequency Provider Last Rate Last Dose  . acetaminophen (TYLENOL) tablet 650 mg  650 mg Oral Q6H PRN Raiford Noble [place], DO   650 mg at 07/10/19 7035  . aspirin EC tablet 325 mg  325 mg Oral Q breakfast Marybelle Killings, MD   325 mg at 07/10/19 1047  . dextrose 5 % solution   Intravenous Continuous Raiford Noble [place], DO   Stopped at 07/10/19 1049  . docusate sodium (COLACE) capsule 100 mg  100 mg Oral BID Marybelle Killings, MD   100 mg at 07/10/19 1048  . donepezil (ARICEPT) tablet 5 mg  5 mg Oral Daily Marybelle Killings, MD   5 mg at 07/10/19 1048  . guaiFENesin (MUCINEX) 12 hr tablet 600 mg  600 mg Oral BID Marybelle Killings, MD   600 mg at 07/10/19 1047  . iron polysaccharides (NIFEREX) capsule 150 mg  150 mg Oral Daily Raiford Noble [place], DO   150 mg at 07/09/19 1107  . levothyroxine (SYNTHROID) tablet 88 mcg  88 mcg Oral QAC breakfast Raiford Noble [place], [place]  88 mcg at 07/10/19 0639  . lubiprostone (AMITIZA) capsule 24 mcg  24 mcg Oral BID WC [name], MD   24 mcg at 07/10/19 1047  . Melatonin TABS 3 mg  3 mg Oral QHS [name], MD   3 mg at 07/09/19 2239  . memantine (NAMENDA XR) 24 hr capsule 28 mg  28 mg Oral Daily [name], MD   28 mg at 07/10/19 1048  . menthol-cetylpyridinium (CEPACOL) lozenge 3 mg  1 lozenge Oral PRN [name], MD       Or  . phenol (CHLORASEPTIC) mouth spray 1 spray  1 spray Mouth/Throat PRN [name], MD      . metoCLOPramide (REGLAN) tablet 5-10 mg  5-10 mg Oral Q8H PRN [name], MD       Or  . metoCLOPramide (REGLAN) injection 5-10 mg  5-10 mg Intravenous Q8H PRN [name], MD      . mirabegron ER [facility]) tablet 25  mg  25 mg Oral Q24H [name], MD   25 mg at 07/09/19 1843  . morphine 2 MG/ML injection 0.5 mg  0.5 mg Intravenous Q2H PRN [name], MD   0.5 mg at 07/06/19 2311  . ondansetron (ZOFRAN) tablet 4 mg  4 mg Oral Q6H PRN [name], MD       Or  . ondansetron [facility]) injection 4 mg  4 mg Intravenous Q6H PRN [name], MD      . potassium chloride SA (K-DUR) CR tablet 40 mEq  40 mEq Oral BID Sheikh, Omair Latif, DO   40 mEq at 07/10/19 1048  . traMADol (ULTRAM) tablet 50 mg  50 mg Oral Q12H PRN Marguerita Merles Latif, DO   50 mg at 07/10/19 1047  . traZODone (DESYREL) tablet 25 mg  25 mg Oral QHS [name], MD   25 mg at 07/09/19 2239     Discharge Medications: Please see discharge summary for a list of discharge medications.  Relevant Imaging Results:  Relevant Lab Results:   Additional Information [id number]  Dorothe Pea Yoshi Vicencio, LCSW

## 2019-07-11 LAB — COMPREHENSIVE METABOLIC PANEL
ALT: 18 U/L (ref 0–44)
AST: 24 U/L (ref 15–41)
Albumin: 2.2 g/dL — ABNORMAL LOW (ref 3.5–5.0)
Alkaline Phosphatase: 57 U/L (ref 38–126)
Anion gap: 10 (ref 5–15)
BUN: 12 mg/dL (ref 6–20)
CO2: 24 mmol/L (ref 22–32)
Calcium: 7.7 mg/dL — ABNORMAL LOW (ref 8.9–10.3)
Chloride: 102 mmol/L (ref 98–111)
Creatinine, Ser: 0.79 mg/dL (ref 0.61–1.24)
GFR calc Af Amer: >60 mL/min (ref >60)
GFR calc non Af Amer: >60 mL/min (ref >60)
Glucose, Bld: 121 mg/dL — ABNORMAL HIGH (ref 70–99)
Potassium: 3.5 mmol/L (ref 3.5–5.1)
Sodium: 136 mmol/L (ref 135–145)
Total Bilirubin: 0.9 mg/dL (ref 0.3–1.2)
Total Protein: 5.3 g/dL — ABNORMAL LOW (ref 6.5–8.1)

## 2019-07-11 LAB — CBC WITH DIFFERENTIAL/PLATELET
Abs Immature Granulocytes: 0.06 10*3/uL (ref 0.00–0.07)
Basophils Absolute: 0.1 10*3/uL (ref 0.0–0.1)
Basophils Relative: 1 %
Eosinophils Absolute: 0.3 10*3/uL (ref 0.0–0.5)
Eosinophils Relative: 3 %
HCT: 25.9 % — ABNORMAL LOW (ref 39.0–52.0)
Hemoglobin: 8.5 g/dL — ABNORMAL LOW (ref 13.0–17.0)
Immature Granulocytes: 1 %
Lymphocytes Relative: 7 %
Lymphs Abs: 0.7 10*3/uL (ref 0.7–4.0)
MCH: 33.9 pg (ref 26.0–34.0)
MCHC: 32.8 g/dL (ref 30.0–36.0)
MCV: 103.2 fL — ABNORMAL HIGH (ref 80.0–100.0)
Monocytes Absolute: 1.1 10*3/uL — ABNORMAL HIGH (ref 0.1–1.0)
Monocytes Relative: 12 %
Neutro Abs: 7.1 10*3/uL (ref 1.7–7.7)
Neutrophils Relative %: 76 %
Platelets: 211 10*3/uL (ref 150–400)
RBC: 2.51 MIL/uL — ABNORMAL LOW (ref 4.22–5.81)
RDW: 15.6 % — ABNORMAL HIGH (ref 11.5–15.5)
WBC: 9.3 10*3/uL (ref 4.0–10.5)
nRBC: 0.5 % — ABNORMAL HIGH (ref 0.0–0.2)

## 2019-07-11 LAB — TYPE AND SCREEN
ABO/RH(D): O POS
Antibody Screen: NEGATIVE
Unit division: 0

## 2019-07-11 LAB — GLUCOSE, CAPILLARY
Glucose-Capillary: 109 mg/dL — ABNORMAL HIGH (ref 70–99)
Glucose-Capillary: 109 mg/dL — ABNORMAL HIGH (ref 70–99)
Glucose-Capillary: 110 mg/dL — ABNORMAL HIGH (ref 70–99)
Glucose-Capillary: 83 mg/dL (ref 70–99)
Glucose-Capillary: 87 mg/dL (ref 70–99)
Glucose-Capillary: 93 mg/dL (ref 70–99)

## 2019-07-11 LAB — BPAM RBC
Blood Product Expiration Date: 202010262359
ISSUE DATE / TIME: 202009271226
Unit Type and Rh: 5100

## 2019-07-11 LAB — MAGNESIUM: Magnesium: 2.1 mg/dL (ref 1.7–2.4)

## 2019-07-11 LAB — PHOSPHORUS: Phosphorus: 3.2 mg/dL (ref 2.5–4.6)

## 2019-07-11 MED ORDER — POTASSIUM CHLORIDE CRYS ER 20 MEQ PO TBCR
40.0000 meq | EXTENDED_RELEASE_TABLET | Freq: Once | ORAL | Status: AC
  Administered 2019-07-11: 11:00:00 40 meq via ORAL
  Filled 2019-07-11: qty 2

## 2019-07-11 NOTE — Progress Notes (Signed)
PROGRESS NOTE    Brian Stanton  WUJ:[id number] DOB: 1961/08/03 DOA: 07/05/2019 PCP: Default, Provider, MD   Brief Narrative:  HPI per Dr. [name] on 07/05/2019 Brian Stanton is a 58 y.o. male with history of Down syndrome, intellectual disability, dementia, hypothyroidism who as per the report he is usually ambulatory was found to be not wanting to walk this morning and was complaining of knee pain.  The exact site of the right knee pain was not sure.  Given the symptoms patient was brought to the ER.  Patient's mother was unable to provide any more history at this time.  ED Course: In the ER patient had CT scan of the head and neck and abdomen.  Shows bilateral hip fracture the right one looks chronic in the left on those acute.  On-call orthopedic surgeon Dr. [name] was consulted and is planning to have surgery tomorrow.  CAT scan of the abdomen also shows features concerning for pneumonia.  And also shows chronic left UPJ obstruction and bladder wall thickening.  UA was unremarkable.  CT of the head and C-spine shows large fat-containing soft tissue in the posterior part of the neck which will need further work-up differentials include lipoma versus liposarcoma.  Patient admitted for further management of hip fracture.  **Interim History  Patient had some urinary retention so had to have a Foley catheter placed and this was removed today after surgical intervention.  Continues to have some agitation and is wearing safety mittens.  Underwent surgical intervention yesterday and had a left Biomet now short trochanteric nail with interlocking and the right hip bipolar hemiarthroplasty done by Dr. [name].  Postoperatively he has had acute anemia and hemoglobin is trending down we will need to further evaluate the need for any blood products and Hgb/Hct has slowed been dropping and is now 7.3/22.9.  He was transfused 1 unit of PRBCs and this is now improved 8.5/25.9  Assessment & Plan:   Active  Problems:   Closed intertrochanteric fracture of hip, left, initial encounter (HCC)   Closed displaced fracture of right femoral neck (HCC)   Hip fracture (HCC)   Hypothyroidism   Closed nondisplaced intertrochanteric fracture of left femur (HCC)   Anemia due to acute blood loss   Bilateral hip fracture with Acute Left Inertrochanteric status post a left trochanteric nail with interlocking as well as a right hip bipolar hemiarthroplasty postoperative Day 5  -The left one appears to be acute as per the orthopedic surgeon and the right one looks chronic plan is to get surgery done on the left hip at this time -Patient is status post surgical intervention with intramedullary nail intratrochanteric on the left and total right arthroplasty bipolar hip her right -Orthopedic surgery recommending advancing diet up with therapy and discontinue IV fluids as well as discharge to SNF when bed is available.  -Pain relief medications with IV morphine 0.5 mg every 2 as needed for severe pain and was given 1 dose of 4 mg once yesterday; added acetaminophen 650 mg p.o. every 6 PRN for mild pain and I have added as needed tramadol 50 mg every 12 PRN for moderate pain -Orthopedic surgery has added aspirin 325 mg p.o. daily with breakfast but may need to hold given that the patient's Hgb is dropping  -Patient will be at moderate risk for intermediate risk procedure. -PT/OT recommending SNF and unfortunately patient cannot go to his group home because of his level of acuity and will have the case manager and TOC  work on finding a SNF placement prior to him returning to his group home given his acuity of care  Suspected Pneumonia with Sepsis ruled out; Likely was Inflammatory -Given the elevated lactate though patient is presently hemodynamically stable.  Did have an elevated procalcitonin level 5.18 -He was placed on the antibiotics with azithromycin and ceftriaxone as well as IV fluids with D5 normal saline at a  rate of 50 mL's per hour for 24 hours; Will Stop Abx as repeat CXR is improved  -CT scan of the chest showed patchy airspace opacities in the medial basal segment of the right lower lobe which are new since prior study probably infectious versus inflammatory and questionable if patient has aspirated -Repeat chest x-ray this AM showed "Normal heart size and mediastinal contours. Granulomatous nodal calcifications in the left mediastinum. Mild interstitial crowding at the bases. There is no edema, consolidation, effusion, or pneumothorax." -Lactic acid level was elevated at 4.3 is now trended down to 1.8 -Procalcitonin level was 5.18 and not repeated this morning -WBC trending down from 13.8 -> 7.3 -> 9.5 -> 6.8 -> 9.3 -Does not appear to be septic as he is afebrile has no tachypnea or tachycardia,  and leukocytosis likely secondary to his hip fracture and trending down -Blood cultures x2 showed no growth to date at 5 Days  -Repeat CBC in the a.m. and repeat chest x-ray if necessary -Started the patient on guaifenesin 600 mg p.o. twice daily and will continue  Hypothyroidism  -TSH was 4.592 however free T4 was normal at 1.10 -Resumed Home Levothyroxine 88 mcg  History of dementia and intellect disability with history of Down syndrome  -On Donepezil 5 mg po Daily, Trazodone 25 mg po qHS, and Memantine 28 mg po Daily. -SLP done and they are recommending honey thick liquids with dysphagia 1 diet which is pure  Soft tissue swelling in the posterior aspect of the neck concerning for lipoma versus liposarcoma  -Will need further work-up as outpatient.  Chronic left UPJ obstruction  -UA unremarkable.  -Urinalysis showed clear appearance with yellow color urine and negative ketones negative leukocytes -CT Abdomen and Pelvis showed "Chronic left UPJ obstruction. Chronic diffuse thickening of the wall of the urinary bladder." -Obtain a renal ultrasound and showed "Stable moderate to severe left  hydronephrosis related to chronic UPJ obstruction. Mild bladder wall thickening could be related to chronic bladder outlet obstruction or cystitis." -Outpatient Urology follow up   Acute Urinary Retention, improved  -Urinalysis was unremarkable and urine culture showed NGTD  -C/w Mirabergron ER 25 mg po q24h -Bladder scan showed 542 mL's and because patient was going for surgical intervention a Foley catheter was placed and now removed post-operatively -Will obtain a Renal ultrasound now and showed "Stable moderate to severe left hydronephrosis related to chronic UPJ obstruction. Mild bladder wall thickening could be related to chronic bladder outlet obstruction or cystitis." -Will need to bladder scan every shift  Poor urine output, improved -Patient had 125 mL's of output all day yesterday per nursing staff -Bladder scan done yesterday showed values of 222 and repeat was 163 -Started on IVF yesterday and given a bolus of NS of 500 mL/hr and started on Maintenance IVF with NS at 75 mL/hr and changed to D5W this AM given Hypernatremia/Hyperchloremia and now stopped   Elevated AST -Mild as AST was 45 and trended down to 34 and is now 24 -Continue to Monitor and Trend and repeat CMP in AM   Hypophosphatemia -Patient's Phos Level was  3.2 this AM -Continue to Monitor and Replete as Necessary -Repeat Phos Level in AM   Acute Postoperative Blood Loss Anemia/Microcytic Anemia -Patient's hemoglobin/hematocrit went from 13.7/41.6 likely had a dilutional drop with IV fluid hydration and now is down to 7.3/22.9 -Likely postoperative drop given his surgeries; will type and screen and transfuse 1 unit of PRBCs today given continued dropping; now it is improved to 8.5/25.9 -Continue to monitor for signs and symptoms of bleeding; currently no overt bleeding noted but will obtain an FOBT -Patient was started on Iron Polysaccharides 150 mg p.o. daily and was given a dose of Ferumoxytol 510 mg IV x1 now  that we feel that his pneumonia is ruled out -Checked Anemia Panel and showed iron level of 12, U IBC 127, TIBC of 139, saturation ratio of 9%, ferritin level of 178, folate level 17.8, and vitamin B12 level of 314 -Transfusion if hemoglobin drops less than 7.5 -Repeat CBC in a.m.  Thrombocytopenia, improved  -Patient platelet count went from 232 and trended down to 142 and in the setting of postoperative bleeding and surgery; Now is 211 -Continue to monitor for signs and symptoms of bleeding; currently no overt bleeding noted -Repeat CBC in a.m.   Hypokalemia, improved  -The patient's potassium is now 3.5 -Replete with p.o. potassium chloride 40 mg twice once -Continue to monitor and replete as necessary -Repeat CMP in a.m.  Hypernatremia/Hyperchloremia, improved -Patient's Na+ was 146 and Chloride was 116; now improved and now sodium is 136 and chloride is 102 -IVF were changed to D5W at 75 mL/hr and now stopped by Ortho -Continue to Monitor and Repeat CMP in AM   DVT prophylaxis: SCDs with pharmacological prophylaxis per orthopedic surgery Code Status: FULL CODE Family Communication: No family present at bedside  Disposition Plan: SNF when bed is available and patient needs PASRR  Consultants:   Orthopedic Surgery Dr. [name]   Procedures:  Left Biomet affixes 9 mm short trochanteric nail with interlock.  95 mm lag screw.  Right hip bipolar hemiarthroplasty, cemented stem.  43 mm ball. Depuy   Antimicrobials:  Anti-infectives (From admission, onward)   Start     Dose/Rate Route Frequency Ordered Stop   07/08/19 2200  azithromycin (ZITHROMAX) tablet 500 mg  Status:  Discontinued     500 mg Oral Daily at bedtime 07/08/19 1050 07/08/19 1647   07/06/19 1730  ceFAZolin (ANCEF) IVPB 2g/100 mL premix     2 g 200 mL/hr over 30 Minutes Intravenous On call to O.R. 07/06/19 1705 07/06/19 1831   07/06/19 1713  ceFAZolin (ANCEF) 2-4 GM/100ML-% IVPB    Note to Pharmacy: Sharyn Creamer    : cabinet override      07/06/19 1713 07/06/19 1816   07/05/19 2200  cefTRIAXone (ROCEPHIN) 2 g in sodium chloride 0.9 % 100 mL IVPB  Status:  Discontinued     2 g 200 mL/hr over 30 Minutes Intravenous Every 24 hours 07/05/19 2100 07/08/19 1647   07/05/19 2200  azithromycin (ZITHROMAX) 500 mg in sodium chloride 0.9 % 250 mL IVPB  Status:  Discontinued     500 mg 250 mL/hr over 60 Minutes Intravenous Every 24 hours 07/05/19 2100 07/08/19 1050     Subjective: Patient is a thin pleasantly demented Caucasian male with intellectual disability and Down syndrome who again does not respond to questioning and does not follow commands.  Still wearing a soft mitten restraints and appears to be not in any acute distress.  Social work was consulted for  assistance with placement  Objective: Vitals:   07/10/19 1549 07/10/19 2025 07/11/19 0605 07/11/19 1228  BP: 116/72 (!) 121/95 (!) 95/58 (!) 93/53  Pulse: 67 67 64 60  Resp: 16 18 16 16   Temp: 98.2 F (36.8 C) 98 F (36.7 C) 98 F (36.7 C) 97.7 F (36.5 C)  TempSrc: Oral Oral Oral Oral  SpO2: 100% 97% 100% 95%  Weight:      Height:        Intake/Output Summary (Last 24 hours) at 07/11/2019 1352 Last data filed at 07/11/2019 0108 Gross per 24 hour  Intake 402.5 ml  Output 1300 ml  Net -897.5 ml   Filed Weights   07/09/19 1044  Weight: 59 kg   Examination: Physical Exam:  Constitutional: Thin Caucasian male with intellectual disability and Down syndrome in no acute distress appears slightly anxious and continues to be nonverbal Eyes: Lids and conjunctivae normal, sclerae anicteric  ENMT: External Ears, Nose appear normal. Grossly normal hearing.  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 Normal respiratory effort and patient is not  tachypenic. No accessory muscle use.  Unlabored breathing and is not wearing supplemental oxygen via nasal cannula Cardiovascular: RRR, no murmurs / rubs / gallops. S1 and S2 auscultated. No extremity edema.  Abdomen: Soft, non-tender, non-distended. Bowel sounds positive.  GU: Deferred. Musculoskeletal: No clubbing / cyanosis of digits/nails. No joint deformity in the upper and lower extremities.  Skin: No rashes, lesions, ulcers on a limited skin evaluation and incisions of the hips appear clean dry and intact. No induration; Warm and dry.  Neurologic: Does not really follow commands but moves extremities spontaneously.  Wearing soft mitten restraints Psychiatric: Impaired judgment and insight.  Awake.  Did not appear to have an anxious mood today  Data Reviewed: I have personally reviewed following labs and imaging studies  CBC: Recent Labs  Lab 07/07/19 0500 07/07/19 1413 07/08/19 0429 07/09/19 0446 07/10/19 0358 07/11/19 0441  WBC 9.4  --  7.3 9.5 6.8 9.3  NEUTROABS 8.2*  --  5.0 8.0* 4.9 7.1  HGB 8.5* 8.0* 7.6* 7.5* 7.3* 8.5*  HCT 26.0* 24.5* 22.8* 23.8* 22.9* 25.9*  MCV 106.6*  --  106.5* 107.7* 107.0* 103.2*  PLT 143*  --  142* 192 202 924   Basic Metabolic Panel: Recent Labs  Lab 07/07/19 0500 07/08/19 0429 07/09/19 0446 07/10/19 0358 07/11/19 0441  NA 141 142 146* 140 136  K 3.8 3.4* 3.7 3.1* 3.5  CL 110 110 116* 110 102  CO2 25 25 25 24 24   GLUCOSE 140* 91 110* 109* 121*  BUN 17 16 15 10 12   CREATININE 1.08 1.07 0.86 0.88 0.79  CALCIUM 7.8* 8.0* 8.0* 7.8* 7.7*  MG 2.1 2.1 2.3 2.1 2.1  PHOS 1.9* 2.8 2.5 2.8 3.2   GFR: Estimated Creatinine Clearance: 73.4 mL/min (by C-G formula based on SCr of 0.79 mg/dL). Liver Function Tests: Recent Labs  Lab 07/07/19 0500 07/08/19 0429 07/09/19 0446 07/10/19 0358 07/11/19 0441  AST 26 26 31  34 24  ALT 18 16 21 21 18   ALKPHOS 37* 39 53 53 57  BILITOT 0.7 0.8 0.9 0.9 0.9  PROT 5.4* 5.1* 5.4* 5.1* 5.3*  ALBUMIN  2.8* 2.5* 2.4* 2.2* 2.2*   No results for input(s): LIPASE, AMYLASE in the last 168 hours. No results for input(s): AMMONIA in the  last 168 hours. Coagulation Profile: No results for input(s): INR, PROTIME in the last 168 hours. Cardiac Enzymes: Recent Labs  Lab 07/05/19 1353  CKTOTAL 791*   BNP (last 3 results) No results for input(s): PROBNP in the last 8760 hours. HbA1C: No results for input(s): HGBA1C in the last 72 hours. CBG: Recent Labs  Lab 07/10/19 1851 07/11/19 0026 07/11/19 0627 07/11/19 0745 07/11/19 1223  GLUCAP 111* 109* 109* 110* 93   Lipid Profile: No results for input(s): CHOL, HDL, LDLCALC, TRIG, CHOLHDL, LDLDIRECT in the last 72 hours. Thyroid Function Tests: No results for input(s): TSH, T4TOTAL, FREET4, T3FREE, THYROIDAB in the last 72 hours. Anemia Panel: No results for input(s): VITAMINB12, FOLATE, FERRITIN, TIBC, IRON, RETICCTPCT in the last 72 hours. Sepsis Labs: Recent Labs  Lab 07/05/19 1342 07/05/19 2133 07/06/19 0031  PROCALCITON  --  5.18  --   LATICACIDVEN 4.3* 3.2* 1.8    Recent Results (from the past 240 hour(s))  Urine culture     Status: None   Collection Time: 07/05/19  1:38 PM   Specimen: Urine, Random  Result Value Ref Range Status   Specimen Description   Final    URINE, RANDOM Performed at [facility], [address] [place], [place] [address]    Special Requests   Final    NONE Performed at [facility], [address], [place], [place] [address]    Culture   Final    NO GROWTH Performed at [facility], [address] [place], [place] [address]    Report Status 07/06/2019 FINAL  Final  SARS Coronavirus 2 [facility] order, Performed in [facility] hospital lab) Nasopharyngeal Urine, Catheterized     Status: None   Collection Time: 07/05/19  1:41 PM   Specimen: Urine, Catheterized; Nasopharyngeal  Result Value Ref Range Status   SARS Coronavirus 2 NEGATIVE NEGATIVE  Final    Comment: (NOTE) If result is NEGATIVE SARS-CoV-2 target nucleic acids are NOT DETECTED. The SARS-CoV-2 RNA is generally detectable in upper and lower  respiratory specimens during the acute phase of infection. The lowest  concentration of SARS-CoV-2 viral copies this assay can detect is 250  copies / mL. A negative result does not preclude SARS-CoV-2 infection  and should not be used as the sole basis for treatment or other  patient management decisions.  A negative result may occur with  improper specimen collection / handling, submission of specimen other  than nasopharyngeal swab, presence of viral mutation(s) within the  areas targeted by this assay, and inadequate number of viral copies  (<250 copies / mL). A negative result must be combined with clinical  observations, patient history, and epidemiological information. If result is POSITIVE SARS-CoV-2 target nucleic acids are DETECTED. The SARS-CoV-2 RNA is generally detectable in upper and lower  respiratory specimens dur ing the acute phase of infection.  Positive  results are indicative of active infection with SARS-CoV-2.  Clinical  correlation with patient history and other diagnostic information is  necessary to determine patient infection status.  Positive results do  not rule out bacterial infection or co-infection with other viruses. If result is PRESUMPTIVE POSTIVE SARS-CoV-2 nucleic acids MAY BE PRESENT.   A presumptive positive result was obtained on the submitted specimen  and confirmed on repeat testing.  While 2019 novel coronavirus  (SARS-CoV-2) nucleic acids may be present in the submitted sample  additional confirmatory testing may be necessary for epidemiological  and / or clinical management  purposes  to differentiate between  SARS-CoV-2 and other Sarbecovirus currently known to infect humans.  If clinically indicated additional testing with an alternate test  methodology [phone]) is advised. The  SARS-CoV-2 RNA is generally  detectable in upper and lower respiratory sp ecimens during the acute  phase of infection. The expected result is Negative. Fact Sheet for Patients:  BoilerBrush.com.cy Fact Sheet for Healthcare Providers: https://pope.com/ This test is not yet approved or cleared by the [place] FDA and has been authorized for detection and/or diagnosis of SARS-CoV-2 by FDA under an Emergency Use Authorization (EUA).  This EUA will remain in effect (meaning this test can be used) for the duration of the COVID-19 declaration under Section 564(b)(1) of the Act, 21 U.S.C. section 360bbb-3(b)(1), unless the authorization is terminated or revoked sooner. Performed at [facility], [address], [place], [place] [address]   Culture, blood (routine x 2)     Status: None   Collection Time: 07/05/19  9:33 PM   Specimen: BLOOD  Result Value Ref Range Status   Specimen Description   Final    BLOOD LEFT ANTECUBITAL Performed at [facility], [address] [place], [place] [address]    Special Requests   Final    BOTTLES DRAWN AEROBIC AND ANAEROBIC Blood Culture adequate volume Performed at [facility], [address] [place], [place] [address]    Culture   Final    NO GROWTH 5 DAYS Performed at [facility], [address] [place], [place] [address]    Report Status 07/10/2019 FINAL  Final  Culture, blood (routine x 2)     Status: None   Collection Time: 07/05/19  9:33 PM   Specimen: BLOOD  Result Value Ref Range Status   Specimen Description   Final    BLOOD RIGHT ANTECUBITAL Performed at [facility], [address], [place], [place] [address]    Special Requests   Final    BOTTLES DRAWN AEROBIC AND ANAEROBIC Blood Culture adequate volume Performed at [facility], [address] [place], [place] [address]     Culture   Final    NO GROWTH 5 DAYS Performed at [facility], [address] [place], [place] [address]    Report Status 07/10/2019 FINAL  Final  MRSA PCR Screening     Status: None   Collection Time: 07/06/19 12:04 AM   Specimen: Nasal Mucosa; Nasopharyngeal  Result Value Ref Range Status   MRSA by PCR NEGATIVE NEGATIVE Final    Comment:        The GeneXpert MRSA Assay (FDA approved for NASAL specimens only), is one component of a comprehensive MRSA colonization surveillance program. It is not intended to diagnose MRSA infection nor to guide or monitor treatment for MRSA infections. Performed at [facility], [address] [place], [place] [address]     Radiology Studies: No results found. Scheduled Meds: . aspirin EC  325 mg Oral Q breakfast  . docusate sodium  100 mg Oral BID  . donepezil  5 mg Oral Daily  . guaiFENesin  600 mg Oral BID  . iron polysaccharides  150 mg Oral Daily  . levothyroxine  88 mcg Oral QAC breakfast  . lubiprostone  24 mcg Oral BID WC  . Melatonin  3 mg Oral QHS  . memantine  28 mg Oral Daily  . mirabegron ER  25 mg Oral Q24H  .  traZODone  25 mg Oral QHS   Continuous Infusions:   LOS: 6 days   Merlene Laughtermair Latif Naheem Mosco, DO [facility] PAGER is on AMION  If 7PM-7AM, please contact night-coverage [email or web address] Password [email or web address] 07/11/2019, 1:52 PM

## 2019-07-11 NOTE — Progress Notes (Signed)
Physical Therapy Treatment Patient Details Name: Brian Stanton MRN: [id number] DOB: [birth year] Today's Date: 07/11/2019    History of Present Illness 58 yo male admitted to ED on 9/22 from group home with bilateral hip fractures, R hip appearing more chronic than L. S/p R hip bipolar hemiarthroplasty posterior approach and L trochanteric IM nail on 9/23. PMH includes cognitive deficit, down syndrome, dementia.    PT Comments    Pt assisted to sitting EOB and requiring max to total assist.  Pt more agreeable to sit EOB today and even had some thickened water. (RN notified).  Continue to recommend SNF upon d/c.  Follow Up Recommendations  SNF;Supervision/Assistance - 24 hour     Equipment Recommendations  None recommended by PT    Recommendations for Other Services       Precautions / Restrictions Precautions Precautions: Fall;Posterior Hip Precaution Comments: Per Dr. Lorin Mercy, pt with R hemiarthroplasty posterior approach, but pt does not follow commands so do not educate pt on posterior precautions Restrictions Weight Bearing Restrictions: Yes RLE Weight Bearing: Weight bearing as tolerated LLE Weight Bearing: Touchdown weight bearing    Mobility  Bed Mobility Overal bed mobility: Needs Assistance Bed Mobility: Supine to Sit     Supine to sit: Total assist;+2 for physical assistance Sit to supine: Max assist;+2 for physical assistance   General bed mobility comments: assist for upper and lower body as pt not initiating; pt did initiate return to supine however appeared fearful or having pain and required assist  Transfers                 General transfer comment: pt states "NO!" when asked if he will stand, was able to sit EOB for 8 minutes  Ambulation/Gait                 Stairs             Wheelchair Mobility    Modified Rankin (Stroke Patients Only)       Balance Overall balance assessment: Needs assistance Sitting-balance support: No  upper extremity supported;Feet supported Sitting balance-Leahy Scale: Fair Sitting balance - Comments: Pt able to sit up unsupported                                    Cognition Arousal/Alertness: Awake/alert Behavior During Therapy: Flat affect Overall Cognitive Status: History of cognitive impairments - at baseline                                 General Comments: pt with history of profound cognitive impairment, dementia. Pt does not follow commands      Exercises      General Comments        Pertinent Vitals/Pain Pain Assessment: Faces Faces Pain Scale: Hurts little more Pain Location: LEs, with moving supine<>sit Pain Descriptors / Indicators: Grimacing;Guarding Pain Intervention(s): Repositioned;Monitored during session    Home Living                      Prior Function            PT Goals (current goals can now be found in the care plan section) Progress towards PT goals: Progressing toward goals    Frequency    Min 2X/week      PT Plan      Co-evaluation  AM-PAC PT "6 Clicks" Mobility   Outcome Measure  Help needed turning from your back to your side while in a flat bed without using bedrails?: Total Help needed moving from lying on your back to sitting on the side of a flat bed without using bedrails?: Total Help needed moving to and from a bed to a chair (including a wheelchair)?: Total Help needed standing up from a chair using your arms (e.g., wheelchair or bedside chair)?: Total Help needed to walk in hospital room?: Total Help needed climbing 3-5 steps with a railing? : Total 6 Click Score: 6    End of Session   Activity Tolerance: Patient limited by fatigue(also limited by cognition) Patient left: in bed;with call bell/phone within reach;with bed alarm set Nurse Communication: Mobility status;Precautions PT Visit Diagnosis: Other abnormalities of gait and mobility (R26.89);Muscle  weakness (generalized) (M62.81)     Time: 7026-3785 PT Time Calculation (min) (ACUTE ONLY): 18 min  Charges:  $Therapeutic Activity: 8-22 mins                     Zenovia Jarred, PT, DPT Acute Rehabilitation Services Office: [phone] Pager: [phone]  Sarajane Jews 07/11/2019, 3:39 PM

## 2019-07-12 LAB — COMPREHENSIVE METABOLIC PANEL
ALT: 19 U/L (ref 0–44)
AST: 22 U/L (ref 15–41)
Albumin: 2.2 g/dL — ABNORMAL LOW (ref 3.5–5.0)
Alkaline Phosphatase: 72 U/L (ref 38–126)
Anion gap: 9 (ref 5–15)
BUN: 12 mg/dL (ref 6–20)
CO2: 23 mmol/L (ref 22–32)
Calcium: 7.8 mg/dL — ABNORMAL LOW (ref 8.9–10.3)
Chloride: 105 mmol/L (ref 98–111)
Creatinine, Ser: 0.84 mg/dL (ref 0.61–1.24)
GFR calc Af Amer: >60 mL/min (ref >60)
GFR calc non Af Amer: >60 mL/min (ref >60)
Glucose, Bld: 88 mg/dL (ref 70–99)
Potassium: 3.7 mmol/L (ref 3.5–5.1)
Sodium: 137 mmol/L (ref 135–145)
Total Bilirubin: 1 mg/dL (ref 0.3–1.2)
Total Protein: 5.3 g/dL — ABNORMAL LOW (ref 6.5–8.1)

## 2019-07-12 LAB — GLUCOSE, CAPILLARY
Glucose-Capillary: 102 mg/dL — ABNORMAL HIGH (ref 70–99)
Glucose-Capillary: 110 mg/dL — ABNORMAL HIGH (ref 70–99)
Glucose-Capillary: 88 mg/dL (ref 70–99)
Glucose-Capillary: 98 mg/dL (ref 70–99)

## 2019-07-12 LAB — CBC WITH DIFFERENTIAL/PLATELET
Abs Immature Granulocytes: 0.06 10*3/uL (ref 0.00–0.07)
Basophils Absolute: 0.1 10*3/uL (ref 0.0–0.1)
Basophils Relative: 1 %
Eosinophils Absolute: 0.2 10*3/uL (ref 0.0–0.5)
Eosinophils Relative: 2 %
HCT: 27.1 % — ABNORMAL LOW (ref 39.0–52.0)
Hemoglobin: 8.8 g/dL — ABNORMAL LOW (ref 13.0–17.0)
Immature Granulocytes: 1 %
Lymphocytes Relative: 9 %
Lymphs Abs: 0.9 10*3/uL (ref 0.7–4.0)
MCH: 34 pg (ref 26.0–34.0)
MCHC: 32.5 g/dL (ref 30.0–36.0)
MCV: 104.6 fL — ABNORMAL HIGH (ref 80.0–100.0)
Monocytes Absolute: 1.2 10*3/uL — ABNORMAL HIGH (ref 0.1–1.0)
Monocytes Relative: 12 %
Neutro Abs: 7.1 10*3/uL (ref 1.7–7.7)
Neutrophils Relative %: 75 %
Platelets: 264 10*3/uL (ref 150–400)
RBC: 2.59 MIL/uL — ABNORMAL LOW (ref 4.22–5.81)
RDW: 15.3 % (ref 11.5–15.5)
WBC: 9.5 10*3/uL (ref 4.0–10.5)
nRBC: 0.2 % (ref 0.0–0.2)

## 2019-07-12 LAB — SARS CORONAVIRUS 2 (TAT 6-24 HRS): SARS Coronavirus 2: NEGATIVE

## 2019-07-12 LAB — MAGNESIUM: Magnesium: 2.1 mg/dL (ref 1.7–2.4)

## 2019-07-12 LAB — PHOSPHORUS: Phosphorus: 3.3 mg/dL (ref 2.5–4.6)

## 2019-07-12 MED ORDER — ACETAMINOPHEN 325 MG PO TABS: 650.0000 mg | ORAL_TABLET | Freq: Four times a day (QID) | ORAL | 0 refills | Status: AC | PRN

## 2019-07-12 MED ORDER — ASPIRIN 325 MG PO TBEC: 325.0000 mg | DELAYED_RELEASE_TABLET | Freq: Every day | ORAL | 0 refills | Status: DC

## 2019-07-12 MED ORDER — TRAMADOL HCL 50 MG PO TABS: 50.0000 mg | ORAL_TABLET | Freq: Two times a day (BID) | ORAL | 0 refills | Status: DC | PRN

## 2019-07-12 MED ORDER — POLYSACCHARIDE IRON COMPLEX 150 MG PO CAPS: 150.0000 mg | ORAL_CAPSULE | Freq: Every day | ORAL | 0 refills | Status: DC

## 2019-07-12 MED ORDER — ONDANSETRON HCL 4 MG PO TABS: 4.0000 mg | ORAL_TABLET | Freq: Four times a day (QID) | ORAL | 0 refills | Status: DC | PRN

## 2019-07-12 NOTE — TOC Progression Note (Signed)
Transition of Care [facility]) - Progression Note    Patient Details  Name: Brian Stanton MRN: [id number] Date of Birth: 1961/06/18  Transition of Care [facility]) CM/SW [place], LCSW Phone Number: 07/12/2019, 3:35 PM  Clinical Narrative:   Patient has a bed at [facility] but will need an up to date covid test before being able to discharge. Once patient has received a negative covid test, patient can discharge to [facility].     Expected Discharge Plan: Skilled Nursing Facility Barriers to Discharge: Continued Medical Work up  Expected Discharge Plan and Services Expected Discharge Plan: [place]         Expected Discharge Date: 07/12/19                                     Social Determinants of Health (SDOH) Interventions    Readmission Risk Interventions No flowsheet data found.

## 2019-07-12 NOTE — NC FL2 (Signed)
[place] LEVEL OF CARE SCREENING TOOL     IDENTIFICATION  Patient Name: Brian Stanton Birthdate: 1961-08-17 Sex: male Admission Date (Current Location): 07/05/2019  [place] and [place] Number:  Kathleen Argue [phone] [place] and Address:  [facility],  [place], [place]      Provider Number: [id number]  Attending Physician Name and Address:  Kerney Elbe, DO  Relative Name and Phone Number:  Laymond Postle JYNWGN,[phone]    Current Level of Care: Hospital Recommended Level of Care: [place] Prior Approval Number:    Date Approved/Denied:   PASRR Number: [id number]  Discharge Plan: SNF    Current Diagnoses: Patient Active Problem List   Diagnosis Date Noted  . Anemia due to acute blood loss 07/09/2019    Class: Acute  . Closed intertrochanteric fracture of hip, left, initial encounter ([place]) 07/05/2019  . Closed displaced fracture of right femoral neck ([place]) 07/05/2019  . Hip fracture ([place]) 07/05/2019  . Hypothyroidism 07/05/2019  . Closed nondisplaced intertrochanteric fracture of left femur ([place]) 07/05/2019    Orientation RESPIRATION BLADDER Height & Weight     Self    Incontinent Weight: 130 lb 1.1 oz (59 kg) Height:  4\' 11"  (149.9 cm)  BEHAVIORAL SYMPTOMS/MOOD NEUROLOGICAL BOWEL NUTRITION STATUS      Incontinent Diet(Soft diet due to swallowing precautions, a speech evaluation is to take place)  AMBULATORY STATUS COMMUNICATION OF NEEDS Skin   Total Care Non-Verbally (2 bi-lateral hip surgeries, resulting in staples in hip area)                       Personal Care Assistance Level of Assistance  Bathing, Feeding, Dressing Bathing Assistance: Maximum assistance Feeding assistance: Maximum assistance Dressing Assistance: Maximum assistance     Functional Limitations Info             SPECIAL CARE FACTORS FREQUENCY  PT (By licensed PT), OT (By licensed OT)     PT Frequency: 5 OT  Frequency: 5            Contractures Contractures Info: Not present    Additional Factors Info  Code Status, Allergies Code Status Info: FULL Allergies Info: No Known Allergies           Current Medications (07/12/2019):  This is the current hospital active medication list Current Facility-Administered Medications  Medication Dose Route Frequency Provider Last Rate Last Dose  . acetaminophen (TYLENOL) tablet 650 mg  650 mg Oral Q6H PRN Raiford Noble [place], DO   650 mg at 07/11/19 1113  . aspirin EC tablet 325 mg  325 mg Oral Q breakfast Marybelle Killings, MD   325 mg at 07/12/19 0916  . docusate sodium (COLACE) capsule 100 mg  100 mg Oral BID Marybelle Killings, MD   100 mg at 07/12/19 0917  . donepezil (ARICEPT) tablet 5 mg  5 mg Oral Daily Marybelle Killings, MD   5 mg at 07/12/19 0917  . guaiFENesin (MUCINEX) 12 hr tablet 600 mg  600 mg Oral BID Marybelle Killings, MD   600 mg at 07/12/19 0916  . iron polysaccharides (NIFEREX) capsule 150 mg  150 mg Oral Daily Raiford Noble [place], DO   150 mg at 07/11/19 1335  . levothyroxine (SYNTHROID) tablet 88 mcg  88 mcg Oral QAC breakfast Raiford Noble [place], [place]   88 mcg at 07/12/19 4132  . lubiprostone (AMITIZA) capsule 24 mcg  24 mcg Oral BID WC  [name], MD   24 mcg at 07/12/19 [phone]  . Melatonin TABS 3 mg  3 mg Oral QHS [name], MD   3 mg at 07/11/19 2255  . memantine (NAMENDA XR) 24 hr capsule 28 mg  28 mg Oral Daily [name], MD   28 mg at 07/12/19 0917  . menthol-cetylpyridinium (CEPACOL) lozenge 3 mg  1 lozenge Oral PRN [name], MD       Or  . phenol (CHLORASEPTIC) mouth spray 1 spray  1 spray Mouth/Throat PRN [name], MD      . metoCLOPramide (REGLAN) tablet 5-10 mg  5-10 mg Oral Q8H PRN [name], MD       Or  . metoCLOPramide (REGLAN) injection 5-10 mg  5-10 mg Intravenous Q8H PRN [name], MD      . mirabegron ER [facility] [place]) tablet 25 mg  25 mg Oral Q24H [name], MD   25 mg at 07/11/19 1740  . morphine  2 MG/ML injection 0.5 mg  0.5 mg Intravenous Q2H PRN [name], MD   0.5 mg at 07/06/19 2311  . ondansetron (ZOFRAN) tablet 4 mg  4 mg Oral Q6H PRN [name], MD       Or  . ondansetron [facility]) injection 4 mg  4 mg Intravenous Q6H PRN [name], MD      . traMADol Janean Sark) tablet 50 mg  50 mg Oral Q12H PRN Marguerita Merles [place], DO   50 mg at 07/12/19 0041  . traZODone (DESYREL) tablet 25 mg  25 mg Oral QHS [name], MD   25 mg at 07/11/19 2255     Discharge Medications: Please see discharge summary for a list of discharge medications.  Relevant Imaging Results:  Relevant Lab Results:   Additional Information [id number] has Down's syndrome  Althea Charon, LCSW

## 2019-07-12 NOTE — Discharge Summary (Addendum)
Physician Discharge Summary  Darrnell Mangiaracina QMV:[id number] DOB: 11/29/1960 DOA: 07/05/2019  PCP: Default, Provider, MD  Admit date: 07/05/2019 Discharge date: 07/12/2019  Admitted From: Group Home Disposition: SNF  Recommendations for Outpatient Follow-up:  1. Follow up with PCP in 1-2 weeks 2. Follow up with Orthopedic Surgery within 1-2 weeks 3. Follow up SLP at SNF 4. Follow up with Urology in the outpatient setting for Chronic UPJ obstruction 5. Please obtain CMP/CBC, Mag, Phos in one week 6. Please follow up on the following pending results:  Home Health: No Equipment/Devices: None recommended by PT  Discharge Condition: Stable CODE STATUS: FULL CODE Diet recommendation: Per SLP Dysphagia 1 Diet with Nectar Thick Liquids Now with Allowance of tsp of Think Liquids  Brief/Interim Summary: HPI per Dr. [name] on 07/05/2019 Brian Stanton a 58 y.o.malewithhistory of Down syndrome, intellectual disability, dementia, hypothyroidism who as per the report he is usually ambulatory was found to be not wanting to walk this morning and was complaining of knee pain. The exact site of the right knee pain was not sure. Given the symptoms patient was brought to the ER. Patient's mother was unable to provide any more history at this time.  ED Course:In the ER patient had CT scan of the head and neck and abdomen. Shows bilateral hip fracture the right one looks chronic in the left on those acute. On-call orthopedic surgeon Dr. [name] was consulted and is planning to have surgery tomorrow. CAT scan of the abdomen also shows features concerning for pneumonia. And also shows chronic left UPJ obstruction and bladder wall thickening. UA was unremarkable. CT of the head and C-spine shows large fat-containing soft tissue in the posterior part of the neck which will need further work-up differentials include lipoma versus liposarcoma. Patient admitted for further management of hip  fracture.  **Interim History  Patient had some urinary retention so had to have a Foley catheter placed and this was removed today after surgical intervention.  Continues to have some agitation and is wearing safety mittens.  Underwent surgical intervention yesterday and had a left Biomet now short trochanteric nail with interlocking and the right hip bipolar hemiarthroplasty done by Dr. [name].  Postoperatively he has had acute anemia and hemoglobin is trending down we will need to further evaluate the need for any blood products and Hgb/Hct has slowed been dropping and is now 7.3/22.9.  He was transfused 1 unit of PRBCs and this is now improved 8.5/25.9 and is further trending up.  Patient was deemed stable to be discharged to skilled nursing facility and he will need to follow-up with orthopedic surgery within 1 to 2 weeks.  Facility requested repeat COVIDtesting which is still pending but patient is stable to be discharged currently as soon as COVID testing is back we will discharge to the facility.  Discharge Diagnoses:  Active Problems:   Closed intertrochanteric fracture of hip, left, initial encounter (HCC)   Closed displaced fracture of right femoral neck (HCC)   Hip fracture (HCC)   Hypothyroidism   Closed nondisplaced intertrochanteric fracture of left femur (HCC)   Anemia due to acute blood loss  Bilateral hip fracture with Acute Left Inertrochanteric status post a left trochanteric nail with interlocking as well as a right hip bipolar hemiarthroplasty postoperative Day 6 -The left one appears to be acute as per the orthopedic surgeon and the right one looks chronic plan is to get surgery done on the left hip at this time -Patient is status post surgical intervention  with intramedullary nail intratrochanteric on the left and total right arthroplasty bipolar hip her right -Orthopedic surgery recommending advancing diet up with therapy and discontinue IV fluids as well as discharge to SNF  when bed is available.  -Pain relief medications with IV morphine 0.5 mg every 2 as needed for severe pain and was given 1 dose of 4 mg once yesterday; added acetaminophen 650 mg p.o. every 6 PRN for mild pain and I have added as needed tramadol 50 mg every 12 PRN for moderate pain -Orthopedic surgery has added aspirin 325 mg p.o. daily with breakfast but may need to hold given that the patient's Hgb is dropping  -Patient will be at moderate risk for intermediate risk procedure. -PT/OT recommending SNF and unfortunately patient cannot go to his group home because of his level of acuity and will have the case manager and TOC work on finding a SNF placement prior to him returning to his group home given his acuity of care; social worker has found a bed for the patient and he has a Building surveyor and only thing that is keeping him here is now a repeat COVID Testing -D/C to SNF when [place] results   Suspected Pneumonia with Sepsis ruled out; Likely was Inflammatory and now Respiratory Status is stable  -Given the elevated lactate though patient is presently hemodynamically stable.  Did have an elevated procalcitonin level 5.18 -He was placed on the antibiotics with azithromycin and ceftriaxone as well as IV fluids with D5 normal saline at a rate of 50 mL's per hour for 24 hours; Will Stop Abx as repeat CXR is improved  -CT scan of the chest showed patchy airspace opacities in the medial basal segment of the right lower lobe which are new since prior study probably infectious versus inflammatory and questionable if patient has aspirated -Repeat chest x-ray this AM showed "Normal heart size and mediastinal contours. Granulomatous nodal calcifications in the left mediastinum. Mild interstitial crowding at the bases. There is no edema, consolidation, effusion, or pneumothorax." -Lactic acid level was elevated at 4.3 is now trended down to 1.8 -Procalcitonin level was 5.18 and not repeated this morning -WBC  trending down from 13.8 -> 7.3 -> 9.5 -> 6.8 -> 9.3 -> 9.5 -Does not appear to be septic as he is afebrile has no tachypnea or tachycardia,  and leukocytosis likely secondary to his hip fracture and trending down -Blood cultures x2 showed no growth to date at 5 Days  -Repeat CBC in the a.m. and repeat chest x-ray if necessary -Started the patient on guaifenesin 600 mg p.o. twice daily and will continue  Hypothyroidism  -TSH was 4.592 however free T4 was normal at 1.10 -Resumed Home Levothyroxine 88 mcg -Follow up with PCP and repeat TFTs within 4-6 weeks   History of dementia and intellect disability with history of Down syndrome  -On Donepezil 5 mg po Daily, Trazodone 25 mg po qHS, and Memantine 28 mg po Daily. -SLP done and they are recommending honey thick liquids with dysphagia 1 diet which is pure  Soft tissue swelling in the posterior aspect of the neck concerning for lipoma versus liposarcoma  -Will need further work-up as outpatient with PCP  Chronic left UPJ obstruction  -UA unremarkable.  -Urinalysis showed clear appearance with yellow color urine and negative ketones negative leukocytes -CT Abdomen and Pelvis showed "Chronic left UPJ obstruction. Chronic diffuse thickening of the wall of the urinary bladder." -Obtain a renal ultrasound and showed "Stable moderate to severe left  hydronephrosis related to chronic UPJ obstruction. Mild bladder wall thickening could be related to chronic bladder outlet obstruction or cystitis." -Outpatient Urology follow up   Acute Urinary Retention, improved  -Urinalysis was unremarkable and urine culture showed NGTD  -C/w Mirabergron ER 25 mg po q24h -Bladder scan showed 542 mL's and because patient was going for surgical intervention a Foley catheter was placed and now removed post-operatively -Will obtain a Renal ultrasound now and showed "Stable moderate to severe left hydronephrosis related to chronic UPJ obstruction. Mild bladder wall  thickening could be related to chronic bladder outlet obstruction or cystitis." -Will need to bladder scan every shift -Follow up with Urology as an outpatient   Poor urine output, improved -Patient had 125 mL's of output all day yesterday per nursing staff -Bladder scan done yesterday showed values of 222 and repeat was 163 -Started on IVF yesterday and given a bolus of NS of 500 mL/hr and started on Maintenance IVF with NS at 75 mL/hr and changed to D5W this AM given Hypernatremia/Hyperchloremia and now stopped  -Continue to Monitor   Elevated AST -Mild as AST was 45 and trended down to 34 and is now 22 -Continue to Monitor and Trend and repeat CMP in AM   Hypophosphatemia -Patient's Phos Level was 3.3 this AM -Continue to Monitor and Replete as Necessary -Repeat Phos Level at SNF  Acute Postoperative Blood Loss Anemia/Microcytic Anemia -Patient's hemoglobin/hematocrit went from 13.7/41.6 likely had a dilutional drop with IV fluid hydration and now is down to 7.3/22.9 -Likely postoperative drop given his surgeries; will type and screen and transfuse 1 unit of PRBCs today given continued dropping; now it is improved to 8.8/27.1 -Continue to monitor for signs and symptoms of bleeding; currently no overt bleeding noted but will obtain an FOBT -Patient was started on Iron Polysaccharides 150 mg p.o. daily and was given a dose of Ferumoxytol 510 mg IV x1 now that we feel that his pneumonia is ruled out -Checked Anemia Panel and showed iron level of 12, U IBC 127, TIBC of 139, saturation ratio of 9%, ferritin level of 178, folate level 17.8, and vitamin B12 level of 314 -Transfusion if hemoglobin drops less than 7.5 -Repeat CBC at SNF  Thrombocytopenia, improved  -Patient platelet count went from 232 and trended down to 142 and in the setting of postoperative bleeding and surgery; Now is 211 -Continue to monitor for signs and symptoms of bleeding; currently no overt bleeding  noted -Repeat CBC at SNF   Hypokalemia, improved  -The patient's potassium is now 3.7 -Continue to monitor and replete as necessary -Repeat CMP in a.m.  Hypernatremia/Hyperchloremia, improved -Patient's Na+ was 146 and Chloride was 116; now improved and now sodium is 137 and chloride is 105 -IVF were changed to D5W at 75 mL/hr and now stopped by Ortho -Continue to Monitor and Repeat CMP as an outpatient   Discharge Instructions Discharge Instructions    Call MD for:  difficulty breathing, headache or visual disturbances   Complete by: As directed    Call MD for:  extreme fatigue   Complete by: As directed    Call MD for:  hives   Complete by: As directed    Call MD for:  persistant dizziness or light-headedness   Complete by: As directed    Call MD for:  persistant nausea and vomiting   Complete by: As directed    Call MD for:  redness, tenderness, or signs of infection (pain, swelling, redness, odor or green/yellow  discharge around incision site)   Complete by: As directed    Call MD for:  severe uncontrolled pain   Complete by: As directed    Call MD for:  temperature >100.4   Complete by: As directed    Diet - low sodium heart healthy   Complete by: As directed    DYSPHAGIA 1 DIET WITH HONEY THICK LIQUIDS   Discharge instructions   Complete by: As directed    You were cared for by a hospitalist during your hospital stay. If you have any questions about your discharge medications or the care you received while you were in the hospital after you are discharged, you can call the unit and ask to speak with the hospitalist on call if the hospitalist that took care of you is not available. Once you are discharged, your primary care physician will handle any further medical issues. Please note that NO REFILLS for any discharge medications will be authorized once you are discharged, as it is imperative that you return to your primary care physician (or establish a relationship with a  primary care physician if you do not have one) for your aftercare needs so that they can reassess your need for medications and monitor your lab values.  Follow up with PCP, Orthopedic Surgery, and Urology as an outpatient. Take all medications as prescribed. If symptoms change or worsen please return to the ED for evaluation   Increase activity slowly   Complete by: As directed      Allergies as of 07/12/2019   No Known Allergies     Medication List    TAKE these medications   acetaminophen 325 MG tablet Commonly known as: TYLENOL Take 2 tablets (650 mg total) by mouth every 6 (six) hours as needed for mild pain.   ALIGN PO Take 1 capsule by mouth every evening.   aspirin 325 MG EC tablet Take 1 tablet (325 mg total) by mouth daily with breakfast. Start taking on: July 13, 2019   BABY SHAMPOO EX Apply 1 application topically daily. Use to wash eyes and face daily   BLISTEX LIP BALM EX Apply 1 application topically 3 (three) times daily.   cholecalciferol 25 MCG (1000 UT) tablet Commonly known as: VITAMIN D3 Take 1,000 Units by mouth daily.   docusate sodium 100 MG capsule Commonly known as: COLACE Take 100 mg by mouth 2 (two) times daily.   donepezil 5 MG tablet Commonly known as: ARICEPT Take 5 mg by mouth daily.   guaiFENesin 600 MG 12 hr tablet Commonly known as: MUCINEX Take 600 mg by mouth 2 (two) times daily.   hydrocortisone 25 MG suppository Commonly known as: ANUSOL-HC Place 25 mg rectally 3 (three) times daily as needed for hemorrhoids.   iron polysaccharides 150 MG capsule Commonly known as: NIFEREX Take 1 capsule (150 mg total) by mouth daily.   levothyroxine 88 MCG tablet Commonly known as: SYNTHROID Take 88 mcg by mouth daily before breakfast.   lubiprostone 24 MCG capsule Commonly known as: AMITIZA Take 24 mcg by mouth 2 (two) times a day.   Melatonin 3 MG Tabs Take 3 mg by mouth at bedtime.   Myrbetriq 25 MG Tb24 tablet Generic  drug: mirabegron ER Take 25 mg by mouth daily. At 4 pm   Namenda XR 28 MG Cp24 24 hr capsule Generic drug: memantine Take 28 mg by mouth daily.   ondansetron 4 MG tablet Commonly known as: ZOFRAN Take 1 tablet (4 mg total) by  mouth every 6 (six) hours as needed for nausea.   promethazine 25 MG suppository Commonly known as: PHENERGAN Place 25 mg rectally every 4 (four) hours as needed for nausea or vomiting.   traMADol 50 MG tablet Commonly known as: ULTRAM Take 1 tablet (50 mg total) by mouth every 12 (twelve) hours as needed for moderate pain.   traZODone 50 MG tablet Commonly known as: DESYREL Take 25 mg by mouth at bedtime.      Contact information for after-discharge care    Destination    [facility] Preferred SNF .   Service: Skilled Nursing Contact information: [address] [place] [address] [phone]             No Known Allergies  Consultations:  Orthopedic Surgery  Procedures/Studies: Dg Chest 1 View  Result Date: 07/05/2019 CLINICAL DATA:  59 year old male with dementia and altered level of consciousness/activity level today. EXAM: CHEST  1 VIEW COMPARISON:  Prior chest x-ray 07/19/2009 FINDINGS: The lungs are clear and negative for focal airspace consolidation, pulmonary edema or suspicious pulmonary nodule. No pleural effusion or pneumothorax. Cardiac and mediastinal contours are within normal limits. Atherosclerotic calcifications visualized along the aorta. No acute fracture or lytic or blastic osseous lesions. The visualized upper abdominal bowel gas pattern is unremarkable. IMPRESSION: No active disease. Electronically Signed   By: Malachy Moan M.D.   On: 07/05/2019 15:28   Dg Knee 2 Views Left  Result Date: 07/05/2019 CLINICAL DATA:  Pain and redness. EXAM: LEFT KNEE - 1-2 VIEW COMPARISON:  No recent. FINDINGS: No acute bony or joint abnormality identified. No evidence of fracture dislocation. IMPRESSION:  No acute abnormality. Electronically Signed   By: Maisie Fus  Register   On: 07/05/2019 13:19   Ct Head Wo Contrast  Result Date: 07/05/2019 CLINICAL DATA:  Altered mental status.  No known injury EXAM: CT HEAD WITHOUT CONTRAST CT CERVICAL SPINE WITHOUT CONTRAST TECHNIQUE: Multidetector CT imaging of the head and cervical spine was performed following the standard protocol without intravenous contrast. Multiplanar CT image reconstructions of the cervical spine were also generated. COMPARISON:  None. FINDINGS: CT HEAD FINDINGS Brain: Motion degraded study.  Multiple repeats Moderate atrophy without hydrocephalus. No acute infarct. Negative for acute hemorrhage or mass. Benign-appearing calcification basal ganglia bilaterally. Vascular: Negative for hyperdense vessel Skull: Negative Sinuses/Orbits: Mild mucosal edema left maxillary sinus likely odontogenic with periapical abscess left upper teeth. Other: None CT CERVICAL SPINE FINDINGS Alignment: 2 mm anterolisthesis C3-4 and C7-T1. Mild retrolisthesis C4-5 and C5-6. Mild anterolisthesis C6-7. Skull base and vertebrae: Negative for cervical spine fracture Soft tissues and spinal canal: Fat containing mass in the subcutaneous tissues of the posterior neck measures approximately 6.6 x 3.1 x 8.3 cm. There is stranding within the fatty mass as well as stranding in the surrounding tissues. Disc levels: Multilevel disc and facet degeneration throughout the cervical spine. Multilevel foraminal stenosis due to spurring. Upper chest: Negative Other: Motion degraded study.  Multiple repeat images. IMPRESSION: 1. Motion degraded CT head and cervical spine 2. No acute intracranial abnormality.  Moderate atrophy. 3. Cervical spondylosis.  Negative for fracture. 4. Large fat containing mass in the subcutaneous tissues posterior neck. This may represent a lipoma or liposarcoma. Physical examination and close follow-up recommended. Electronically Signed   By: Marlan Palau M.D.    On: 07/05/2019 18:44   Ct Cervical Spine Wo Contrast  Result Date: 07/05/2019 CLINICAL DATA:  Altered mental status.  No known injury EXAM: CT HEAD WITHOUT CONTRAST  CT CERVICAL SPINE WITHOUT CONTRAST TECHNIQUE: Multidetector CT imaging of the head and cervical spine was performed following the standard protocol without intravenous contrast. Multiplanar CT image reconstructions of the cervical spine were also generated. COMPARISON:  None. FINDINGS: CT HEAD FINDINGS Brain: Motion degraded study.  Multiple repeats Moderate atrophy without hydrocephalus. No acute infarct. Negative for acute hemorrhage or mass. Benign-appearing calcification basal ganglia bilaterally. Vascular: Negative for hyperdense vessel Skull: Negative Sinuses/Orbits: Mild mucosal edema left maxillary sinus likely odontogenic with periapical abscess left upper teeth. Other: None CT CERVICAL SPINE FINDINGS Alignment: 2 mm anterolisthesis C3-4 and C7-T1. Mild retrolisthesis C4-5 and C5-6. Mild anterolisthesis C6-7. Skull base and vertebrae: Negative for cervical spine fracture Soft tissues and spinal canal: Fat containing mass in the subcutaneous tissues of the posterior neck measures approximately 6.6 x 3.1 x 8.3 cm. There is stranding within the fatty mass as well as stranding in the surrounding tissues. Disc levels: Multilevel disc and facet degeneration throughout the cervical spine. Multilevel foraminal stenosis due to spurring. Upper chest: Negative Other: Motion degraded study.  Multiple repeat images. IMPRESSION: 1. Motion degraded CT head and cervical spine 2. No acute intracranial abnormality.  Moderate atrophy. 3. Cervical spondylosis.  Negative for fracture. 4. Large fat containing mass in the subcutaneous tissues posterior neck. This may represent a lipoma or liposarcoma. Physical examination and close follow-up recommended. Electronically Signed   By: Marlan Palau M.D.   On: 07/05/2019 18:44   Ct Abdomen Pelvis W  Contrast  Result Date: 07/05/2019 CLINICAL DATA:  had a BM in bed. According to nursing home personnel he usually walks and moves around as he wants to but today is not wanting to walk or stand. He is guarding his knee (side is questionable). No reported mechanism of injury, fall, or trauma. Patient has a history of down syndrome and is non verbal and combative. EXAM: CT ABDOMEN AND PELVIS WITH CONTRAST TECHNIQUE: Multidetector CT imaging of the abdomen and pelvis was performed using the standard protocol following bolus administration of intravenous contrast. CONTRAST:  OMNIPAQUE IOHEXOL 300 MG/ML  SOLN COMPARISON:  01/16/2012 FINDINGS: Lower chest: No pleural or pericardial effusion. Patchy airspace opacities in the medial basal segment right lower lobe, new since previous. Hepatobiliary: No focal liver abnormality is seen. No gallstones, gallbladder wall thickening, or biliary dilatation. Pancreas: Unremarkable. No pancreatic ductal dilatation or surrounding inflammatory changes. Spleen: Normal in size. Scattered punctate calcified granulomas. Adrenals/Urinary Tract: Normal adrenals. Right kidney unremarkable. Chronic left UPJ obstruction. No renal mass. Urinary bladder incompletely distended, thick-walled. Stomach/Bowel: Small hiatal hernia. Stomach is nondilated. Small bowel decompressed. Normal appendix. The colon is nondilated. Scattered sigmoid diverticula without significant adjacent inflammatory/edematous change or abscess. Moderate rectal fecal material. Vascular/Lymphatic: No significant vascular findings are present. No enlarged abdominal or pelvic lymph nodes. Reproductive: Prostate normal in size with central coarse calcifications. Other: No ascites. No free air. Musculoskeletal: Comminuted left intertrochanteric femur fracture with mild varus deformity. Regional soft tissue swelling. Chronic appearing subcapital or midcervical right femur fracture, new since 01/16/2012, with resorption along  the fracture lines. Bony pelvis intact. Degenerative disc disease L1-2. 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 IMPRESSION: 1. Comminuted left intertrochanteric femur fracture with mild varus deformity. 2. 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 3. Patchy airspace opacities in the medial basal segment right lower lobe, new since previous study, possibly infectious/inflammatory. 4.  Sigmoid diverticulosis. 5. Small hiatal hernia. 6. Chronic left UPJ obstruction. 7. Chronic diffuse thickening  of the wall of the urinary bladder. Electronically Signed   By: Corlis Leak M.D.   On: 07/05/2019 18:31   US Renal  Result Date: 07/08/2019 CLINICAL DATA:  Acute urinary retention EXAM: RENAL / URINARY TRACT ULTRASOUND COMPLETE COMPARISON:  CT 07/05/2019 FINDINGS: Right Kidney: Renal measurements: 9.2 x 3.5 x 6.7 cm = volume: 78.7 mL . Echogenicity within normal limits. No mass or hydronephrosis visualized. Left Kidney: Renal measurements: 11.0 x 8.6 x 4.2 cm = volume: 205.1 mL. Moderate to severe left hydronephrosis is stable since prior CT compatible with chronic left UPJ obstruction. Normal echotexture. Bladder: Mild wall thickening measuring up to 8 mm. 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 Mild bladder wall thickening could be related to chronic bladder outlet obstruction or cystitis. Recommend clinical correlation. Electronically Signed   By: Charlett Nose M.D.   On: 07/08/2019 17:38   Pelvis Portable  Result Date: 07/06/2019 CLINICAL DATA:  Postop EXAM: PORTABLE PELVIS 1-2 VIEWS COMPARISON:  07/05/2019 FINDINGS: Changes of right hip hemiarthroplasty and internal fixation across the left femoral intertrochanteric fracture. No hardware complicating feature. Normal AP alignment. IMPRESSION: Postoperative changes in the hips bilaterally as above. No visible hardware complicating feature. Electronically  Signed   By: Charlett Nose M.D.   On: 07/06/2019 23:07   Dg Chest Port 1 View  Result Date: 07/08/2019 CLINICAL DATA:  Shortness of breath EXAM: PORTABLE CHEST 1 VIEW COMPARISON:  07/05/2019 FINDINGS: Normal heart size and mediastinal contours. Granulomatous nodal calcifications in the left mediastinum. Mild interstitial crowding at the bases. There is no edema, consolidation, effusion, or pneumothorax. IMPRESSION: No evidence of active disease. Electronically Signed   By: Marnee Spring M.D.   On: 07/08/2019 11:04   Dg Knee Complete 4 Views Right  Result Date: 07/05/2019 CLINICAL DATA:  Right knee pain EXAM: RIGHT KNEE - COMPLETE 4+ VIEW COMPARISON:  None. FINDINGS: No evidence of fracture, dislocation, or joint effusion. No evidence of arthropathy or other focal bone abnormality. Soft tissues are unremarkable. IMPRESSION: Negative. Electronically Signed   By: Elige Ko   On: 07/05/2019 15:24   Dg C-arm 1-60 Min-no Report  Result Date: 07/06/2019 Fluoroscopy was utilized by the requesting physician.  No radiographic interpretation.   Dg C-arm 1-60 Min-no Report  Result Date: 07/06/2019 Fluoroscopy was utilized by the requesting physician.  No radiographic interpretation.   Dg Hip Operative Unilat W Or W/o Pelvis Left  Result Date: 07/06/2019 CLINICAL DATA:  Comminuted left intertrochanteric femur fracture EXAM: OPERATIVE LEFT HIP (WITH PELVIS IF PERFORMED)  VIEWS TECHNIQUE: Fluoroscopic spot image(s) were submitted for interpretation post-operatively. COMPARISON:  None. FINDINGS: Intraop fluoroscopic spot images document placement of IM rod with distal interlocking screw and proximal sliding screw across the femoral neck, transfixing comminuted left IT femur fracture in near anatomic alignment. IMPRESSION: Internal fixation of left intertrochanteric femur fracture. Electronically Signed   By: Corlis Leak M.D.   On: 07/06/2019 19:45   Dg Hip Operative Unilat W Or W/o Pelvis Right  Result  Date: 07/06/2019 CLINICAL DATA:  Right hip fracture EXAM: OPERATIVE RIGHT HIP (WITH PELVIS IF PERFORMED) 5 VIEWS TECHNIQUE: Fluoroscopic spot image(s) were submitted for interpretation post-operatively. COMPARISON:  07/05/2019 FINDINGS: Multiple intraoperative spot images demonstrate changes of right hip replacement. No hardware bony complicating feature. IMPRESSION: Right hip replacement.  No complicating feature. Electronically Signed   By: Charlett Nose M.D.   On: 07/06/2019 21:37   Dg Hips Bilat W Or Wo Pelvis 3-4 Views  Result Date: 07/05/2019 CLINICAL DATA:  Patient unable to walk today. EXAM: DG HIP (WITH OR WITHOUT PELVIS) 3-4V BILAT COMPARISON:  None. FINDINGS: Displaced right subcapital femoral neck fracture. Slight sclerosis along the fracture cleft which may reflect a subacute injury. Comminuted left intertrochanteric fracture with apex lateral angulation. No other acute fracture or dislocation. No aggressive osseous lesion. IMPRESSION: Displaced right subcapital femoral neck fracture. Slight sclerosis along the fracture cleft which may reflect a subacute injury. Comminuted left intertrochanteric fracture with apex lateral angulation. Electronically Signed   By: Elige Ko   On: 07/05/2019 15:26   Left Biomet affixes 9 mm short trochanteric nail with interlock. 95 mm lag screw.  Right hip bipolar hemiarthroplasty, cemented stem. 43 mm ball. Depuy   Subjective: Seen and examined at bedside and he is pleasantly demented thin Caucasian male with intellectual disability and Down syndrome who did not get respond to questioning and does not really follow commands.  Still wearing his soft mittens but does not appear to be in any acute distress.  Patient stable from a medical standpoint to be discharged to a skilled nursing facility as his hemoglobin/medically stable and all electrolytes are within normal limits.  He is on a Dysphagia 1 diet with honey thick liquids and he will need to continue  this at the facility.  No other concerns or complaints at this time.  Discharge Exam: Vitals:   07/11/19 2251 07/12/19 0517  BP: 111/67 (!) 117/47  Pulse: 71 69  Resp: 20 20  Temp: 98.9 F (37.2 C) 97.6 F (36.4 C)  SpO2: 96% 96%   Vitals:   07/11/19 0605 07/11/19 1228 07/11/19 2251 07/12/19 0517  BP: (!) 95/58 (!) 93/53 111/67 (!) 117/47  Pulse: 64 60 71 69  Resp: 16 16 20 20   Temp: 98 F (36.7 C) 97.7 F (36.5 C) 98.9 F (37.2 C) 97.6 F (36.4 C)  TempSrc: Oral Oral  Oral  SpO2: 100% 95% 96% 96%  Weight:      Height:       General: Pt is alert, awake, not in acute distress Cardiovascular: RRR, S1/S2 +, no rubs, no gallops Respiratory: Diminished bilaterally, no wheezing, no rhonchi; unlabored breathing Abdominal: Soft, NT, ND, bowel sounds + Extremities: no edema, no cyanosis  The results of significant diagnostics from this hospitalization (including imaging, microbiology, ancillary and laboratory) are listed below for reference.    Microbiology: Recent Results (from the past 240 hour(s))  Urine culture     Status: None   Collection Time: 07/05/19  1:38 PM   Specimen: Urine, Random  Result Value Ref Range Status   Specimen Description   Final    URINE, RANDOM Performed at [facility], [address], [place], [place] [address]    Special Requests   Final    NONE Performed at [facility], [address], [place], [place] [address]    Culture   Final    NO GROWTH Performed at [facility], [address] [place], [place] [address]    Report Status 07/06/2019 FINAL  Final  SARS Coronavirus 2 [facility] order, Performed in [facility] hospital lab) Nasopharyngeal Urine, Catheterized     Status: None   Collection Time: 07/05/19  1:41 PM   Specimen: Urine, Catheterized; Nasopharyngeal  Result Value Ref Range Status   SARS Coronavirus 2 NEGATIVE NEGATIVE Final    Comment: (NOTE) If result is NEGATIVE SARS-CoV-2  target nucleic acids are NOT DETECTED. The SARS-CoV-2  RNA is generally detectable in upper and lower  respiratory specimens during the acute phase of infection. The lowest  concentration of SARS-CoV-2 viral copies this assay can detect is 250  copies / mL. A negative result does not preclude SARS-CoV-2 infection  and should not be used as the sole basis for treatment or other  patient management decisions.  A negative result may occur with  improper specimen collection / handling, submission of specimen other  than nasopharyngeal swab, presence of viral mutation(s) within the  areas targeted by this assay, and inadequate number of viral copies  (<250 copies / mL). A negative result must be combined with clinical  observations, patient history, and epidemiological information. If result is POSITIVE SARS-CoV-2 target nucleic acids are DETECTED. The SARS-CoV-2 RNA is generally detectable in upper and lower  respiratory specimens dur ing the acute phase of infection.  Positive  results are indicative of active infection with SARS-CoV-2.  Clinical  correlation with patient history and other diagnostic information is  necessary to determine patient infection status.  Positive results do  not rule out bacterial infection or co-infection with other viruses. If result is PRESUMPTIVE POSTIVE SARS-CoV-2 nucleic acids MAY BE PRESENT.   A presumptive positive result was obtained on the submitted specimen  and confirmed on repeat testing.  While 2019 novel coronavirus  (SARS-CoV-2) nucleic acids may be present in the submitted sample  additional confirmatory testing may be necessary for epidemiological  and / or clinical management purposes  to differentiate between  SARS-CoV-2 and other Sarbecovirus currently known to infect humans.  If clinically indicated additional testing with an alternate test  methodology [phone]) is advised. The SARS-CoV-2 RNA is generally  detectable in upper and lower  respiratory sp ecimens during the acute  phase of infection. The expected result is Negative. Fact Sheet for Patients:  BoilerBrush.com.cy Fact Sheet for Healthcare Providers: https://pope.com/ This test is not yet approved or cleared by the [place] FDA and has been authorized for detection and/or diagnosis of SARS-CoV-2 by FDA under an Emergency Use Authorization (EUA).  This EUA will remain in effect (meaning this test can be used) for the duration of the COVID-19 declaration under Section 564(b)(1) of the Act, 21 U.S.C. section 360bbb-3(b)(1), unless the authorization is terminated or revoked sooner. Performed at [facility], [address], [place], [place] [address]   Culture, blood (routine x 2)     Status: None   Collection Time: 07/05/19  9:33 PM   Specimen: BLOOD  Result Value Ref Range Status   Specimen Description   Final    BLOOD LEFT ANTECUBITAL Performed at [facility], [address] [place], [place] [address]    Special Requests   Final    BOTTLES DRAWN AEROBIC AND ANAEROBIC Blood Culture adequate volume Performed at [facility], [address], [place], [place] [address]    Culture   Final    NO GROWTH 5 DAYS Performed at [facility], [address] [place], [place] [address]    Report Status 07/10/2019 FINAL  Final  Culture, blood (routine x 2)     Status: None   Collection Time: 07/05/19  9:33 PM   Specimen: BLOOD  Result Value Ref Range Status   Specimen Description   Final    BLOOD RIGHT ANTECUBITAL Performed at [facility], [address] [place], [place] [address]    Special Requests   Final    BOTTLES  DRAWN AEROBIC AND ANAEROBIC Blood Culture adequate volume Performed at [facility], [address] [place], [place] [address]    Culture   Final    NO GROWTH 5 DAYS Performed at [facility], [address], [place], [place] [address]    Report Status 07/10/2019 FINAL  Final  MRSA PCR Screening     Status: None   Collection Time: 07/06/19 12:04 AM   Specimen: Nasal Mucosa; Nasopharyngeal  Result Value Ref Range Status   MRSA by PCR NEGATIVE NEGATIVE Final    Comment:        The GeneXpert MRSA Assay (FDA approved for NASAL specimens only), is one component of a comprehensive MRSA colonization surveillance program. It is not intended to diagnose MRSA infection nor to guide or monitor treatment for MRSA infections. Performed at [facility], [address] [place], [place] [address]     Labs: BNP (last 3 results) No results for input(s): BNP in the last 8760 hours. Basic Metabolic Panel: Recent Labs  Lab 07/08/19 0429 07/09/19 0446 07/10/19 0358 07/11/19 0441 07/12/19 0417  NA 142 146* 140 136 137  K 3.4* 3.7 3.1* 3.5 3.7  CL 110 116* 110 102 105  CO2 25 25 24 24 23   GLUCOSE 91 110* 109* 121* 88  BUN 16 15 10 12 12   CREATININE 1.07 0.86 0.88 0.79 0.84  CALCIUM 8.0* 8.0* 7.8* 7.7* 7.8*  MG 2.1 2.3 2.1 2.1 2.1  PHOS 2.8 2.5 2.8 3.2 3.3   Liver Function Tests: Recent Labs  Lab 07/08/19 0429 07/09/19 0446 07/10/19 0358 07/11/19 0441 07/12/19 0417  AST 26 31 34 24 22  ALT 16 21 21 18 19   ALKPHOS 39 53 53 57 72  BILITOT 0.8 0.9 0.9 0.9 1.0  PROT 5.1* 5.4* 5.1* 5.3* 5.3*  ALBUMIN 2.5* 2.4* 2.2* 2.2* 2.2*   No results for input(s): LIPASE, AMYLASE in the last 168 hours. No results for input(s): AMMONIA in the last 168 hours. CBC: Recent Labs  Lab 07/08/19 0429 07/09/19 0446 07/10/19 0358 07/11/19 0441 07/12/19 0417  WBC 7.3 9.5 6.8 9.3 9.5  NEUTROABS 5.0 8.0* 4.9 7.1 7.1  HGB 7.6* 7.5* 7.3* 8.5* 8.8*  HCT 22.8* 23.8* 22.9* 25.9* 27.1*  MCV 106.5* 107.7* 107.0* 103.2* 104.6*  PLT 142* 192 202 211 264   Cardiac Enzymes: Recent Labs  Lab 07/05/19 1353  CKTOTAL 791*   BNP: Invalid input(s):  POCBNP CBG: Recent Labs  Lab 07/11/19 0745 07/11/19 1223 07/11/19 1713 07/11/19 2302 07/12/19 0645  GLUCAP 110* 93 87 83 88   D-Dimer No results for input(s): DDIMER in the last 72 hours. Hgb A1c No results for input(s): HGBA1C in the last 72 hours. Lipid Profile No results for input(s): CHOL, HDL, LDLCALC, TRIG, CHOLHDL, LDLDIRECT in the last 72 hours. Thyroid function studies No results for input(s): TSH, T4TOTAL, T3FREE, THYROIDAB in the last 72 hours.  Invalid input(s): FREET3 Anemia work up No results for input(s): VITAMINB12, FOLATE, FERRITIN, TIBC, IRON, RETICCTPCT in the last 72 hours. Urinalysis    Component Value Date/Time   COLORURINE YELLOW 07/06/2019 0611   APPEARANCEUR CLEAR 07/06/2019 0611   LABSPEC >1.046 (H) 07/06/2019 0611   PHURINE 6.0 07/06/2019 0611   GLUCOSEU NEGATIVE 07/06/2019 0611   HGBUR NEGATIVE 07/06/2019 0611   BILIRUBINUR NEGATIVE 07/06/2019 0611   KETONESUR NEGATIVE 07/06/2019 0611   PROTEINUR NEGATIVE 07/06/2019 0611   NITRITE NEGATIVE 07/06/2019 0611   LEUKOCYTESUR NEGATIVE 07/06/2019 5621  Sepsis Labs Invalid input(s): PROCALCITONIN,  WBC,  LACTICIDVEN Microbiology Recent Results (from the past 240 hour(s))  Urine culture     Status: None   Collection Time: 07/05/19  1:38 PM   Specimen: Urine, Random  Result Value Ref Range Status   Specimen Description   Final    URINE, RANDOM Performed at [facility], [address] [place], [place] [address]    Special Requests   Final    NONE Performed at [facility], [address], [place], [place] [address]    Culture   Final    NO GROWTH Performed at [facility], [address], [place], [place] [address]    Report Status 07/06/2019 FINAL  Final  SARS Coronavirus 2 [facility] order, Performed in [facility] hospital lab) Nasopharyngeal Urine, Catheterized     Status: None   Collection Time: 07/05/19  1:41 PM   Specimen: Urine,  Catheterized; Nasopharyngeal  Result Value Ref Range Status   SARS Coronavirus 2 NEGATIVE NEGATIVE Final    Comment: (NOTE) If result is NEGATIVE SARS-CoV-2 target nucleic acids are NOT DETECTED. The SARS-CoV-2 RNA is generally detectable in upper and lower  respiratory specimens during the acute phase of infection. The lowest  concentration of SARS-CoV-2 viral copies this assay can detect is 250  copies / mL. A negative result does not preclude SARS-CoV-2 infection  and should not be used as the sole basis for treatment or other  patient management decisions.  A negative result may occur with  improper specimen collection / handling, submission of specimen other  than nasopharyngeal swab, presence of viral mutation(s) within the  areas targeted by this assay, and inadequate number of viral copies  (<250 copies / mL). A negative result must be combined with clinical  observations, patient history, and epidemiological information. If result is POSITIVE SARS-CoV-2 target nucleic acids are DETECTED. The SARS-CoV-2 RNA is generally detectable in upper and lower  respiratory specimens dur ing the acute phase of infection.  Positive  results are indicative of active infection with SARS-CoV-2.  Clinical  correlation with patient history and other diagnostic information is  necessary to determine patient infection status.  Positive results do  not rule out bacterial infection or co-infection with other viruses. If result is PRESUMPTIVE POSTIVE SARS-CoV-2 nucleic acids MAY BE PRESENT.   A presumptive positive result was obtained on the submitted specimen  and confirmed on repeat testing.  While 2019 novel coronavirus  (SARS-CoV-2) nucleic acids may be present in the submitted sample  additional confirmatory testing may be necessary for epidemiological  and / or clinical management purposes  to differentiate between  SARS-CoV-2 and other Sarbecovirus currently known to infect humans.  If  clinically indicated additional testing with an alternate test  methodology [phone]) is advised. The SARS-CoV-2 RNA is generally  detectable in upper and lower respiratory sp ecimens during the acute  phase of infection. The expected result is Negative. Fact Sheet for Patients:  BoilerBrush.com.cy Fact Sheet for Healthcare Providers: https://pope.com/ This test is not yet approved or cleared by the [place] FDA and has been authorized for detection and/or diagnosis of SARS-CoV-2 by FDA under an Emergency Use Authorization (EUA).  This EUA will remain in effect (meaning this test can be used) for the duration of the COVID-19 declaration under Section 564(b)(1) of the Act, 21 U.S.C. section 360bbb-3(b)(1), unless the authorization is terminated or revoked sooner. Performed at [facility], [address]  [place], [place] [address]   Culture, blood (routine x 2)     Status: None   Collection Time: 07/05/19  9:33 PM   Specimen: BLOOD  Result Value Ref Range Status   Specimen Description   Final    BLOOD LEFT ANTECUBITAL Performed at [facility], [address], [place], [place] [address]    Special Requests   Final    BOTTLES DRAWN AEROBIC AND ANAEROBIC Blood Culture adequate volume Performed at [facility], [address], [place], [place] [address]    Culture   Final    NO GROWTH 5 DAYS Performed at [facility], [address], [place], [place] [address]    Report Status 07/10/2019 FINAL  Final  Culture, blood (routine x 2)     Status: None   Collection Time: 07/05/19  9:33 PM   Specimen: BLOOD  Result Value Ref Range Status   Specimen Description   Final    BLOOD RIGHT ANTECUBITAL Performed at [facility], [address] [place], [place] [address]    Special Requests   Final    BOTTLES DRAWN AEROBIC AND ANAEROBIC Blood Culture  adequate volume Performed at [facility], [address], [place], [place] [address]    Culture   Final    NO GROWTH 5 DAYS Performed at [facility], [address], [place], [place] [address]    Report Status 07/10/2019 FINAL  Final  MRSA PCR Screening     Status: None   Collection Time: 07/06/19 12:04 AM   Specimen: Nasal Mucosa; Nasopharyngeal  Result Value Ref Range Status   MRSA by PCR NEGATIVE NEGATIVE Final    Comment:        The GeneXpert MRSA Assay (FDA approved for NASAL specimens only), is one component of a comprehensive MRSA colonization surveillance program. It is not intended to diagnose MRSA infection nor to guide or monitor treatment for MRSA infections. Performed at [facility], [address] [place], [place] [address]    Time coordinating discharge: 35 minutes  SIGNED:  Merlene Laughter, DO [facility] 07/12/2019, 12:13 PM Pager is on AMION  If 7PM-7AM, please contact night-coverage [email or web address] Password [email or web address]

## 2019-07-12 NOTE — Progress Notes (Signed)
  Speech Language Pathology Treatment: Dysphagia  Patient Details Name: Brian Stanton MRN: [id number] DOB: 1961/07/25 Today's Date: 07/12/2019 Time: 2992-4268 SLP Time Calculation (min) (ACUTE ONLY): 12 min  Assessment / Plan / Recommendation Clinical Impression  Noted pt DC summary written and pt current diet includes HTL via tsp.  SLP follow up to assess readiness for dietary advancement to maximize intake/hydration as safely and efficiently as possible.  Pt with nearly full lunch tray at bedside. RN reports he refused to eat.  Suboptimal position noted - suspect pt's pain from hip impacting positioning.  Pt did accept thin Ensure, thin tea and ice cream from this SLP but turned his head away when offered banana and graham cracker.  He was able to form labial seal on spoon without labial spillage.  Delayed swallow with lingual thrusting noted and no indication of aspiration.  Recommend to advance diet to dys1/nectar and allow tsps of thin.  Hopeful for pt's diet to be advanced further at SNF with SLP follow up and pt acceptance of po.   Pt aspirated audibly during MBS in 2016 with thin but cough did not effectively clear.  Messaged MD, RD and spoke to RN regarding recommendations/plan.  Thanks.     HPI HPI: Brian Stanton is a 58 y.o. male with history of Down syndrome, intellectual disability, dementia, hypothyroidism who as per the report he is usually ambulatory was found to be not wanting to walk and was complaining of knee pain.  CT shows  bilateral hip fracture the right one looks chronic in the left on those acute. Underwent ORIF 9/23. CT of abdomen on admission concerning for pna, CXR on 9/25 clear. Pt has a history of dysphagia and MBS in 2016 recommended limiting bolus size vs using nectar thick liquids.        SLP Plan  Continue with current plan of care       Recommendations  Diet recommendations: Dysphagia 1 (puree);Nectar-thick liquid(thin via tsp, nectar via cup) Liquids provided  via: Teaspoon;Cup Supervision: Full supervision/cueing for compensatory strategies Compensations: Slow rate;Small sips/bites;Other (Comment)(check to assure adequate oral clearance) Postural Changes and/or Swallow Maneuvers: Seated upright 90 degrees;Upright 30-60 min after meal                Oral Care Recommendations: Oral care BID Follow up Recommendations: 24 hour supervision/assistance SLP Visit Diagnosis: Dysphagia, unspecified (R13.10) Plan: Continue with current plan of care       GO                Brian Stanton 07/12/2019, 1:02 PM Brian Stanton, [facility] SLP Acute Rehab Services Pager [phone] Office [phone]

## 2019-07-12 NOTE — Progress Notes (Signed)
Nutrition Follow-up  RD working remotely.   DOCUMENTATION CODES:   Not applicable  INTERVENTION:  - continue to encourage PO intakes. - will monitor for additional nutrition-related needs if unable to d/c to SNF today.    NUTRITION DIAGNOSIS:   Increased nutrient needs related to post-op healing as evidenced by estimated needs. -ongoing  GOAL:   Patient will meet greater than or equal to 90% of their needs -unmet  MONITOR:   PO intake, Labs, Weight trends, Skin  ASSESSMENT:   58 y.o. male with history of Down syndrome, intellectual disability, dementia, hypothyroidism. It is reported that patient is usually ambulatory. He was found not walking and complaining of right knee pain on the morning of 9/22. CT showed bilateral hip fracture with R side appearing chronic and L side appearing acute. Ortho called and plan for surgery 9/23. CT indicated PNA, chronic left UPJ obstruction, and bladder wall thickening. CT head and cervical spine showed fat-containing soft tissue which will need additional work-up for lipoma vs liposarcoma.  Patient has not bee re-weighed since admission (9/26). Diet advanced from NPO to Regular on 9/23 at 2246 and then downgraded to Dysphagia 1, thin liquids on 9/25 at 1248; changed to honey thick liquids on 9/27 at 1520. Per flow sheet, patient consumed 20% of breakfast and 100% of lunch on 9/25 (total of 1102 kcal, 54 grams protein); 100% of breakfast and 50% of dinner on 9/26 (total of 1181 kcal, 56 grams protein); 0% breakfast and lunch on 9/28; 0% of breakfast today.   Order in place as of 15 minutes ago for d/c to SNF today. Discharge summary entered about 10 minutes ago.    Labs reviewed; CBG: 88 mg/dl, Ca: 7.8 mg/dl. Medications reviewed; 100 mg colace BID, 88 mcg oral synthroid/day, 3 mg melatonin/night.      NUTRITION - FOCUSED PHYSICAL EXAM:  unable to complete at this time.   Diet Order:   Diet Order            Diet - low sodium heart  healthy        DIET - DYS 1 Room service appropriate? No; Fluid consistency: Honey Thick  Diet effective now              EDUCATION NEEDS:   Not appropriate for education at this time  Skin:  Skin Assessment: Skin Integrity Issues: Skin Integrity Issues:: Incisions Incisions: R hip 9/23  Last BM:  PTA/unknown  Height:   Ht Readings from Last 1 Encounters:  07/09/19 4\' 11"  (1.499 m)    Weight:   Wt Readings from Last 1 Encounters:  07/09/19 59 kg    Ideal Body Weight:     BMI:  Body mass index is 26.27 kg/m.  Estimated Nutritional Needs:   Kcal:  1900-2100 kcal  Protein:  90-105 grams  Fluid:  >/= 1.8 L/day      Jarome Matin, MS, RD, LDN, [facility] Inpatient Clinical Dietitian Pager # [phone] After hours/weekend pager # [phone]

## 2019-07-13 ENCOUNTER — Telehealth: Payer: Self-pay

## 2019-07-13 LAB — GLUCOSE, CAPILLARY
Glucose-Capillary: 82 mg/dL (ref 70–99)
Glucose-Capillary: 83 mg/dL (ref 70–99)
Glucose-Capillary: 76 mg/dL (ref 70–99)

## 2019-07-13 NOTE — Telephone Encounter (Signed)
[facility] called wanting to know patient's WB status.  Patient had surgery on 07/06/2019 with Dr. Lorin Mercy.  James and I both checked into patient's chart for WB status, but nothing was noted.  Could you please advise?  Cb# is [phone].  Thank you.

## 2019-07-13 NOTE — Telephone Encounter (Signed)
Can you please advise on this? Dr Lorin Mercy patient.

## 2019-07-13 NOTE — TOC Transition Note (Addendum)
Transition of Care [facility]) - CM/SW Discharge Note   Patient Details  Name: Brian Stanton MRN: [id number] Date of Birth: March 16, 1961  Transition of Care [facility]) CM/SW Contact:  Wende Neighbors, LCSW Phone Number: 07/13/2019, 10:47 AM   Clinical Narrative:   Patient will discharge to [facility] via [place]. MD aware of discharge. RN to please call [phone] ([phone]#[id number]) fore report. Patients sister is aware of discharge  CSW also contacted [place] to make her aware of patient discharge to [facility]    Final next level of care: Skilled Nursing Facility Barriers to Discharge: No Barriers Identified   Patient Goals and CMS Choice Patient states their goals for this hospitalization and ongoing recovery are:: Per sister who is primary contact and Legal Guardian pt is to get better and to return to a group home situation      Discharge Placement              Patient chooses bed at: [facility] Patient to be transferred to facility by: ptar Name of family member notified: sister Patient and family notified of of transfer: 07/13/19  Discharge Plan and Services                                     Social Determinants of Health (SDOH) Interventions     Readmission Risk Interventions No flowsheet data found.

## 2019-07-13 NOTE — Telephone Encounter (Signed)
Can you please address this thanks

## 2019-07-13 NOTE — Progress Notes (Signed)
Attempted to call report to Beau Fanny, Therapist, sports at [facility].  RN to call back.  Will await call.

## 2019-07-13 NOTE — Telephone Encounter (Signed)
Talked with Brian Stanton at [facility] and advised her per Dr. Marlou Sa that patient is NWB on the Left and WB as tolerated on the Right.  Brian Stanton voiced that she understands.

## 2019-07-13 NOTE — Progress Notes (Signed)
Report given to Beau Fanny, RN at [facility].

## 2019-07-15 NOTE — Telephone Encounter (Signed)
Taken care of

## 2019-07-28 ENCOUNTER — Emergency Department (HOSPITAL_COMMUNITY): Payer: Medicare Other

## 2019-07-28 ENCOUNTER — Inpatient Hospital Stay: Payer: Medicare Other | Admitting: Orthopedic Surgery

## 2019-07-28 ENCOUNTER — Inpatient Hospital Stay (HOSPITAL_COMMUNITY): Payer: Medicare Other

## 2019-07-28 ENCOUNTER — Inpatient Hospital Stay (HOSPITAL_COMMUNITY)
Admission: EM | Admit: 2019-07-28 | Discharge: 2019-08-08 | DRG: 871 | Disposition: A | Discharge status: Hospice | Payer: Medicare Other | Attending: Family Medicine | Admitting: Family Medicine

## 2019-07-28 ENCOUNTER — Other Ambulatory Visit: Payer: Self-pay

## 2019-07-28 ENCOUNTER — Encounter (HOSPITAL_COMMUNITY): Payer: Self-pay

## 2019-07-28 DIAGNOSIS — Z96641 Presence of right artificial hip joint: Secondary | ICD-10-CM | POA: Diagnosis present

## 2019-07-28 DIAGNOSIS — B951 Streptococcus, group B, as the cause of diseases classified elsewhere: Secondary | ICD-10-CM | POA: Diagnosis not present

## 2019-07-28 DIAGNOSIS — E872 Acidosis: Secondary | ICD-10-CM | POA: Diagnosis present

## 2019-07-28 DIAGNOSIS — J9601 Acute respiratory failure with hypoxia: Secondary | ICD-10-CM | POA: Diagnosis present

## 2019-07-28 DIAGNOSIS — Z960 Presence of urogenital implants: Secondary | ICD-10-CM | POA: Diagnosis not present

## 2019-07-28 DIAGNOSIS — F0390 Unspecified dementia without behavioral disturbance: Secondary | ICD-10-CM | POA: Diagnosis present

## 2019-07-28 DIAGNOSIS — J15211 Pneumonia due to Methicillin susceptible Staphylococcus aureus: Secondary | ICD-10-CM | POA: Diagnosis present

## 2019-07-28 DIAGNOSIS — Z7982 Long term (current) use of aspirin: Secondary | ICD-10-CM

## 2019-07-28 DIAGNOSIS — S72102D Unspecified trochanteric fracture of left femur, subsequent encounter for closed fracture with routine healing: Secondary | ICD-10-CM | POA: Diagnosis not present

## 2019-07-28 DIAGNOSIS — A419 Sepsis, unspecified organism: Secondary | ICD-10-CM

## 2019-07-28 DIAGNOSIS — A4101 Sepsis due to Methicillin susceptible Staphylococcus aureus: Principal | ICD-10-CM | POA: Diagnosis present

## 2019-07-28 DIAGNOSIS — J9600 Acute respiratory failure, unspecified whether with hypoxia or hypercapnia: Secondary | ICD-10-CM | POA: Diagnosis not present

## 2019-07-28 DIAGNOSIS — K0889 Other specified disorders of teeth and supporting structures: Secondary | ICD-10-CM | POA: Diagnosis not present

## 2019-07-28 DIAGNOSIS — K219 Gastro-esophageal reflux disease without esophagitis: Secondary | ICD-10-CM | POA: Diagnosis present

## 2019-07-28 DIAGNOSIS — E43 Unspecified severe protein-calorie malnutrition: Secondary | ICD-10-CM | POA: Diagnosis present

## 2019-07-28 DIAGNOSIS — B9561 Methicillin susceptible Staphylococcus aureus infection as the cause of diseases classified elsewhere: Secondary | ICD-10-CM | POA: Diagnosis not present

## 2019-07-28 DIAGNOSIS — N179 Acute kidney failure, unspecified: Secondary | ICD-10-CM | POA: Diagnosis present

## 2019-07-28 DIAGNOSIS — R627 Adult failure to thrive: Secondary | ICD-10-CM | POA: Diagnosis present

## 2019-07-28 DIAGNOSIS — A401 Sepsis due to streptococcus, group B: Secondary | ICD-10-CM | POA: Diagnosis not present

## 2019-07-28 DIAGNOSIS — N509 Disorder of male genital organs, unspecified: Secondary | ICD-10-CM

## 2019-07-28 DIAGNOSIS — L8990 Pressure ulcer of unspecified site, unspecified stage: Secondary | ICD-10-CM | POA: Insufficient documentation

## 2019-07-28 DIAGNOSIS — N5089 Other specified disorders of the male genital organs: Secondary | ICD-10-CM | POA: Diagnosis not present

## 2019-07-28 DIAGNOSIS — Z79890 Hormone replacement therapy (postmenopausal): Secondary | ICD-10-CM | POA: Diagnosis not present

## 2019-07-28 DIAGNOSIS — Z9289 Personal history of other medical treatment: Secondary | ICD-10-CM

## 2019-07-28 DIAGNOSIS — Y95 Nosocomial condition: Secondary | ICD-10-CM | POA: Diagnosis present

## 2019-07-28 DIAGNOSIS — S72101D Unspecified trochanteric fracture of right femur, subsequent encounter for closed fracture with routine healing: Secondary | ICD-10-CM

## 2019-07-28 DIAGNOSIS — E039 Hypothyroidism, unspecified: Secondary | ICD-10-CM | POA: Diagnosis present

## 2019-07-28 DIAGNOSIS — Z20828 Contact with and (suspected) exposure to other viral communicable diseases: Secondary | ICD-10-CM | POA: Diagnosis present

## 2019-07-28 DIAGNOSIS — Z7401 Bed confinement status: Secondary | ICD-10-CM | POA: Diagnosis not present

## 2019-07-28 DIAGNOSIS — Z7189 Other specified counseling: Secondary | ICD-10-CM

## 2019-07-28 DIAGNOSIS — J690 Pneumonitis due to inhalation of food and vomit: Secondary | ICD-10-CM | POA: Diagnosis present

## 2019-07-28 DIAGNOSIS — W06XXXD Fall from bed, subsequent encounter: Secondary | ICD-10-CM | POA: Diagnosis present

## 2019-07-28 DIAGNOSIS — G9340 Encephalopathy, unspecified: Secondary | ICD-10-CM | POA: Diagnosis present

## 2019-07-28 DIAGNOSIS — M6258 Muscle wasting and atrophy, not elsewhere classified, other site: Secondary | ICD-10-CM | POA: Diagnosis not present

## 2019-07-28 DIAGNOSIS — J1520 Pneumonia due to staphylococcus, unspecified: Secondary | ICD-10-CM | POA: Diagnosis not present

## 2019-07-28 DIAGNOSIS — M9702XA Periprosthetic fracture around internal prosthetic left hip joint, initial encounter: Secondary | ICD-10-CM | POA: Diagnosis present

## 2019-07-28 DIAGNOSIS — Q909 Down syndrome, unspecified: Secondary | ICD-10-CM | POA: Diagnosis not present

## 2019-07-28 DIAGNOSIS — R7881 Bacteremia: Secondary | ICD-10-CM

## 2019-07-28 DIAGNOSIS — Z452 Encounter for adjustment and management of vascular access device: Secondary | ICD-10-CM

## 2019-07-28 DIAGNOSIS — I361 Nonrheumatic tricuspid (valve) insufficiency: Secondary | ICD-10-CM | POA: Diagnosis not present

## 2019-07-28 DIAGNOSIS — X58XXXA Exposure to other specified factors, initial encounter: Secondary | ICD-10-CM | POA: Diagnosis not present

## 2019-07-28 DIAGNOSIS — R451 Restlessness and agitation: Secondary | ICD-10-CM | POA: Diagnosis not present

## 2019-07-28 DIAGNOSIS — R6521 Severe sepsis with septic shock: Secondary | ICD-10-CM | POA: Diagnosis present

## 2019-07-28 DIAGNOSIS — L89890 Pressure ulcer of other site, unstageable: Secondary | ICD-10-CM | POA: Diagnosis present

## 2019-07-28 DIAGNOSIS — R1310 Dysphagia, unspecified: Secondary | ICD-10-CM | POA: Diagnosis present

## 2019-07-28 DIAGNOSIS — R739 Hyperglycemia, unspecified: Secondary | ICD-10-CM | POA: Diagnosis present

## 2019-07-28 DIAGNOSIS — Z978 Presence of other specified devices: Secondary | ICD-10-CM | POA: Diagnosis not present

## 2019-07-28 DIAGNOSIS — Z66 Do not resuscitate: Secondary | ICD-10-CM | POA: Diagnosis not present

## 2019-07-28 DIAGNOSIS — S3130XA Unspecified open wound of scrotum and testes, initial encounter: Secondary | ICD-10-CM | POA: Diagnosis not present

## 2019-07-28 DIAGNOSIS — J189 Pneumonia, unspecified organism: Secondary | ICD-10-CM | POA: Diagnosis present

## 2019-07-28 DIAGNOSIS — Z9911 Dependence on respirator [ventilator] status: Secondary | ICD-10-CM | POA: Diagnosis not present

## 2019-07-28 DIAGNOSIS — Z6820 Body mass index (BMI) 20.0-20.9, adult: Secondary | ICD-10-CM

## 2019-07-28 DIAGNOSIS — N492 Inflammatory disorders of scrotum: Secondary | ICD-10-CM | POA: Diagnosis not present

## 2019-07-28 DIAGNOSIS — Z930 Tracheostomy status: Secondary | ICD-10-CM | POA: Diagnosis not present

## 2019-07-28 DIAGNOSIS — L89899 Pressure ulcer of other site, unspecified stage: Secondary | ICD-10-CM | POA: Diagnosis not present

## 2019-07-28 DIAGNOSIS — Z515 Encounter for palliative care: Secondary | ICD-10-CM

## 2019-07-28 DIAGNOSIS — E876 Hypokalemia: Secondary | ICD-10-CM | POA: Diagnosis present

## 2019-07-28 DIAGNOSIS — D72829 Elevated white blood cell count, unspecified: Secondary | ICD-10-CM | POA: Diagnosis not present

## 2019-07-28 DIAGNOSIS — B955 Unspecified streptococcus as the cause of diseases classified elsewhere: Secondary | ICD-10-CM | POA: Diagnosis not present

## 2019-07-28 DIAGNOSIS — R0902 Hypoxemia: Secondary | ICD-10-CM

## 2019-07-28 DIAGNOSIS — Z781 Physical restraint status: Secondary | ICD-10-CM

## 2019-07-28 DIAGNOSIS — L089 Local infection of the skin and subcutaneous tissue, unspecified: Secondary | ICD-10-CM | POA: Diagnosis not present

## 2019-07-28 DIAGNOSIS — S72002A Fracture of unspecified part of neck of left femur, initial encounter for closed fracture: Secondary | ICD-10-CM

## 2019-07-28 DIAGNOSIS — Z8781 Personal history of (healed) traumatic fracture: Secondary | ICD-10-CM | POA: Diagnosis not present

## 2019-07-28 LAB — LACTIC ACID, PLASMA
Lactic Acid, Venous: 4.5 mmol/L (ref 0.5–1.9)
Lactic Acid, Venous: 5.9 mmol/L (ref 0.5–1.9)

## 2019-07-28 LAB — POCT I-STAT 7, (LYTES, BLD GAS, ICA,H+H)
Acid-base deficit: 4 mmol/L — ABNORMAL HIGH (ref 0.0–2.0)
Bicarbonate: 23.5 mmol/L (ref 20.0–28.0)
Calcium, Ion: 1.1 mmol/L — ABNORMAL LOW (ref 1.15–1.40)
HCT: 41 % (ref 39.0–52.0)
Hemoglobin: 13.9 g/dL (ref 13.0–17.0)
O2 Saturation: 91 %
Patient temperature: 97.7
Potassium: 3.3 mmol/L — ABNORMAL LOW (ref 3.5–5.1)
Sodium: 146 mmol/L — ABNORMAL HIGH (ref 135–145)
TCO2: 25 mmol/L (ref 22–32)
pCO2 arterial: 50.1 mmHg — ABNORMAL HIGH (ref 32.0–48.0)
pH, Arterial: 7.276 — ABNORMAL LOW (ref 7.350–7.450)
pO2, Arterial: 69 mmHg — ABNORMAL LOW (ref 83.0–108.0)

## 2019-07-28 LAB — CBC WITH DIFFERENTIAL/PLATELET
Abs Immature Granulocytes: 0.1 10*3/uL — ABNORMAL HIGH (ref 0.00–0.07)
Basophils Absolute: 0.1 10*3/uL (ref 0.0–0.1)
Basophils Relative: 1 %
Eosinophils Absolute: 0 10*3/uL (ref 0.0–0.5)
Eosinophils Relative: 0 %
HCT: 43.4 % (ref 39.0–52.0)
Hemoglobin: 13.3 g/dL (ref 13.0–17.0)
Immature Granulocytes: 1 %
Lymphocytes Relative: 2 %
Lymphs Abs: 0.3 10*3/uL — ABNORMAL LOW (ref 0.7–4.0)
MCH: 32.1 pg (ref 26.0–34.0)
MCHC: 30.6 g/dL (ref 30.0–36.0)
MCV: 104.8 fL — ABNORMAL HIGH (ref 80.0–100.0)
Monocytes Absolute: 0.3 10*3/uL (ref 0.1–1.0)
Monocytes Relative: 2 %
Neutro Abs: 16.9 10*3/uL — ABNORMAL HIGH (ref 1.7–7.7)
Neutrophils Relative %: 94 %
Platelets: 488 10*3/uL — ABNORMAL HIGH (ref 150–400)
RBC: 4.14 MIL/uL — ABNORMAL LOW (ref 4.22–5.81)
RDW: 16.8 % — ABNORMAL HIGH (ref 11.5–15.5)
WBC: 17.8 10*3/uL — ABNORMAL HIGH (ref 4.0–10.5)
nRBC: 0 % (ref 0.0–0.2)

## 2019-07-28 LAB — GLUCOSE, CAPILLARY
Glucose-Capillary: 143 mg/dL — ABNORMAL HIGH (ref 70–99)
Glucose-Capillary: 144 mg/dL — ABNORMAL HIGH (ref 70–99)
Glucose-Capillary: 109 mg/dL — ABNORMAL HIGH (ref 70–99)
Glucose-Capillary: 111 mg/dL — ABNORMAL HIGH (ref 70–99)
Glucose-Capillary: 122 mg/dL — ABNORMAL HIGH (ref 70–99)

## 2019-07-28 LAB — COMPREHENSIVE METABOLIC PANEL
ALT: 14 U/L (ref 0–44)
AST: 31 U/L (ref 15–41)
Albumin: 2 g/dL — ABNORMAL LOW (ref 3.5–5.0)
Alkaline Phosphatase: 98 U/L (ref 38–126)
Anion gap: 15 (ref 5–15)
BUN: 22 mg/dL — ABNORMAL HIGH (ref 6–20)
CO2: 27 mmol/L (ref 22–32)
Calcium: 7.9 mg/dL — ABNORMAL LOW (ref 8.9–10.3)
Chloride: 99 mmol/L (ref 98–111)
Creatinine, Ser: 1.46 mg/dL — ABNORMAL HIGH (ref 0.61–1.24)
GFR calc Af Amer: >60 mL/min (ref >60)
GFR calc non Af Amer: 52 mL/min — ABNORMAL LOW (ref >60)
Glucose, Bld: 180 mg/dL — ABNORMAL HIGH (ref 70–99)
Potassium: 3.3 mmol/L — ABNORMAL LOW (ref 3.5–5.1)
Sodium: 141 mmol/L (ref 135–145)
Total Bilirubin: 0.6 mg/dL (ref 0.3–1.2)
Total Protein: 5.7 g/dL — ABNORMAL LOW (ref 6.5–8.1)

## 2019-07-28 LAB — LIPASE, BLOOD: Lipase: 46 U/L (ref 11–51)

## 2019-07-28 LAB — PROTIME-INR
INR: 1.4 — ABNORMAL HIGH (ref 0.8–1.2)
Prothrombin Time: 16.7 seconds — ABNORMAL HIGH (ref 11.4–15.2)

## 2019-07-28 LAB — HEMOGLOBIN A1C
Hgb A1c MFr Bld: 4.7 % — ABNORMAL LOW (ref 4.8–5.6)
Mean Plasma Glucose: 88.19 mg/dL

## 2019-07-28 LAB — MRSA PCR SCREENING: MRSA by PCR: NEGATIVE

## 2019-07-28 LAB — MAGNESIUM: Magnesium: 2.2 mg/dL (ref 1.7–2.4)

## 2019-07-28 LAB — APTT: aPTT: 32 seconds (ref 24–36)

## 2019-07-28 LAB — PHOSPHORUS: Phosphorus: 3.6 mg/dL (ref 2.5–4.6)

## 2019-07-28 LAB — SARS CORONAVIRUS 2 BY RT PCR (HOSPITAL ORDER, PERFORMED IN ~~LOC~~ HOSPITAL LAB): SARS Coronavirus 2: NEGATIVE

## 2019-07-28 MED ORDER — SODIUM CHLORIDE 0.9 % IV BOLUS (SEPSIS)
1000.0000 mL | Freq: Once | INTRAVENOUS | Status: AC
  Administered 2019-07-28 (×2): 1000 mL via INTRAVENOUS

## 2019-07-28 MED ORDER — ARTIFICIAL TEARS OPHTHALMIC OINT
TOPICAL_OINTMENT | OPHTHALMIC | Status: DC | PRN
  Filled 2019-07-28: qty 3.5

## 2019-07-28 MED ORDER — LEVOTHYROXINE SODIUM 88 MCG PO TABS
88.0000 ug | ORAL_TABLET | Freq: Every day | ORAL | Status: DC
  Administered 2019-07-28 – 2019-07-30 (×3): 88 ug
  Filled 2019-07-28 (×4): qty 1

## 2019-07-28 MED ORDER — FENTANYL CITRATE (PF) 100 MCG/2ML IJ SOLN
12.5000 ug | INTRAMUSCULAR | Status: DC | PRN
  Administered 2019-07-29 – 2019-07-30 (×8): 100 ug via INTRAVENOUS
  Filled 2019-07-28 (×8): qty 2

## 2019-07-28 MED ORDER — FENTANYL CITRATE (PF) 100 MCG/2ML IJ SOLN
50.0000 ug | INTRAMUSCULAR | Status: DC | PRN
  Administered 2019-07-28: 50 ug via INTRAVENOUS

## 2019-07-28 MED ORDER — ACETAMINOPHEN 160 MG/5ML PO SOLN
650.0000 mg | Freq: Four times a day (QID) | ORAL | Status: DC | PRN
  Administered 2019-07-28: 650 mg
  Filled 2019-07-28: qty 20.3

## 2019-07-28 MED ORDER — SODIUM CHLORIDE 0.9 % IV BOLUS (SEPSIS): 500.0000 mL | Freq: Once | INTRAVENOUS | Status: DC

## 2019-07-28 MED ORDER — SODIUM CHLORIDE 0.9 % IV BOLUS (SEPSIS): 250.0000 mL | Freq: Once | INTRAVENOUS | Status: DC

## 2019-07-28 MED ORDER — ROCURONIUM BROMIDE 50 MG/5ML IV SOLN
INTRAVENOUS | Status: AC | PRN
  Administered 2019-07-28: 100 mg via INTRAVENOUS

## 2019-07-28 MED ORDER — SODIUM CHLORIDE 0.9 % IV SOLN
2.0000 g | Freq: Once | INTRAVENOUS | Status: AC
  Administered 2019-07-28: 2 g via INTRAVENOUS
  Filled 2019-07-28: qty 2

## 2019-07-28 MED ORDER — MIDAZOLAM HCL 2 MG/2ML IJ SOLN: 2.0000 mg | Freq: Once | INTRAMUSCULAR | Status: DC | PRN

## 2019-07-28 MED ORDER — DONEPEZIL HCL 5 MG PO TABS: 5.0000 mg | ORAL_TABLET | Freq: Every day | ORAL | Status: DC

## 2019-07-28 MED ORDER — ETOMIDATE 2 MG/ML IV SOLN
INTRAVENOUS | Status: AC | PRN
  Administered 2019-07-28: 20 mg via INTRAVENOUS

## 2019-07-28 MED ORDER — POTASSIUM CHLORIDE 20 MEQ/15ML (10%) PO SOLN
40.0000 meq | Freq: Once | ORAL | Status: AC
  Administered 2019-07-28: 40 meq
  Filled 2019-07-28: qty 30

## 2019-07-28 MED ORDER — TRAZODONE HCL 50 MG PO TABS: 25.0000 mg | ORAL_TABLET | Freq: Every day | ORAL | Status: DC

## 2019-07-28 MED ORDER — VANCOMYCIN VARIABLE DOSE PER UNSTABLE RENAL FUNCTION (PHARMACIST DOSING): Status: DC

## 2019-07-28 MED ORDER — PANTOPRAZOLE SODIUM 40 MG IV SOLR
40.0000 mg | Freq: Every day | INTRAVENOUS | Status: DC
  Administered 2019-07-28: 40 mg via INTRAVENOUS
  Filled 2019-07-28 (×2): qty 40

## 2019-07-28 MED ORDER — ALBUMIN HUMAN 5 % IV SOLN
25.0000 g | Freq: Once | INTRAVENOUS | Status: AC
  Administered 2019-07-28: 25 g via INTRAVENOUS
  Filled 2019-07-28: qty 500

## 2019-07-28 MED ORDER — DONEPEZIL HCL 5 MG PO TABS
5.0000 mg | ORAL_TABLET | Freq: Every day | ORAL | Status: DC
  Administered 2019-07-28 – 2019-07-30 (×3): 5 mg
  Filled 2019-07-28 (×4): qty 1

## 2019-07-28 MED ORDER — NOREPINEPHRINE 4 MG/250ML-% IV SOLN
2.0000 ug/min | INTRAVENOUS | Status: DC
  Administered 2019-07-28: 3 ug/min via INTRAVENOUS
  Administered 2019-07-28: 9 ug/min via INTRAVENOUS
  Filled 2019-07-28 (×3): qty 250

## 2019-07-28 MED ORDER — FENTANYL CITRATE (PF) 100 MCG/2ML IJ SOLN: 50.0000 ug | Freq: Once | INTRAMUSCULAR | Status: DC | PRN

## 2019-07-28 MED ORDER — TRAZODONE HCL 50 MG PO TABS
25.0000 mg | ORAL_TABLET | Freq: Every day | ORAL | Status: DC
  Administered 2019-07-28 – 2019-07-29 (×2): 25 mg
  Filled 2019-07-28 (×3): qty 1

## 2019-07-28 MED ORDER — OXYBUTYNIN CHLORIDE 5 MG PO TABS
5.0000 mg | ORAL_TABLET | Freq: Three times a day (TID) | ORAL | Status: DC | PRN
  Filled 2019-07-28: qty 1

## 2019-07-28 MED ORDER — DEXMEDETOMIDINE HCL IN NACL 400 MCG/100ML IV SOLN
0.1000 ug/kg/h | INTRAVENOUS | Status: DC
  Administered 2019-07-28: 0.1 ug/kg/h via INTRAVENOUS
  Administered 2019-07-28 – 2019-07-29 (×2): 0.2 ug/kg/h via INTRAVENOUS
  Administered 2019-07-30: 0.4 ug/kg/h via INTRAVENOUS
  Filled 2019-07-28 (×4): qty 100

## 2019-07-28 MED ORDER — MEMANTINE HCL 10 MG PO TABS
20.0000 mg | ORAL_TABLET | Freq: Every day | ORAL | Status: DC
  Administered 2019-07-28 – 2019-07-30 (×3): 20 mg
  Filled 2019-07-28 (×4): qty 2

## 2019-07-28 MED ORDER — ALBUTEROL SULFATE (2.5 MG/3ML) 0.083% IN NEBU
2.5000 mg | INHALATION_SOLUTION | RESPIRATORY_TRACT | Status: DC | PRN
  Filled 2019-07-28: qty 3

## 2019-07-28 MED ORDER — MIDAZOLAM HCL 2 MG/2ML IJ SOLN
2.0000 mg | INTRAMUSCULAR | Status: DC | PRN
  Administered 2019-07-28 – 2019-07-29 (×6): 2 mg via INTRAVENOUS
  Filled 2019-07-28 (×6): qty 2

## 2019-07-28 MED ORDER — ORAL CARE MOUTH RINSE
15.0000 mL | OROMUCOSAL | Status: DC
  Administered 2019-07-28 – 2019-07-30 (×25): 15 mL via OROMUCOSAL

## 2019-07-28 MED ORDER — MORPHINE SULFATE (PF) 4 MG/ML IV SOLN
4.0000 mg | Freq: Once | INTRAVENOUS | Status: AC
  Administered 2019-07-28: 2 mg via INTRAVENOUS
  Filled 2019-07-28: qty 1

## 2019-07-28 MED ORDER — LACTATED RINGERS IV SOLN
INTRAVENOUS | Status: AC
  Administered 2019-07-28 (×2): via INTRAVENOUS

## 2019-07-28 MED ORDER — VANCOMYCIN HCL IN DEXTROSE 1-5 GM/200ML-% IV SOLN
1000.0000 mg | Freq: Once | INTRAVENOUS | Status: AC
  Administered 2019-07-28: 1000 mg via INTRAVENOUS
  Filled 2019-07-28: qty 200

## 2019-07-28 MED ORDER — LACTATED RINGERS IV BOLUS
1000.0000 mL | Freq: Once | INTRAVENOUS | Status: AC
  Administered 2019-07-28: 1000 mL via INTRAVENOUS

## 2019-07-28 MED ORDER — SODIUM CHLORIDE 0.9 % IV SOLN
250.0000 mL | INTRAVENOUS | Status: DC
  Administered 2019-07-28 – 2019-08-04 (×3): 250 mL via INTRAVENOUS

## 2019-07-28 MED ORDER — IPRATROPIUM-ALBUTEROL 0.5-2.5 (3) MG/3ML IN SOLN
3.0000 mL | RESPIRATORY_TRACT | Status: DC | PRN
  Administered 2019-08-02 (×2): 3 mL via RESPIRATORY_TRACT
  Filled 2019-07-28 (×2): qty 3

## 2019-07-28 MED ORDER — CHLORHEXIDINE GLUCONATE 0.12% ORAL RINSE (MEDLINE KIT)
15.0000 mL | Freq: Two times a day (BID) | OROMUCOSAL | Status: DC
  Administered 2019-07-28 – 2019-07-30 (×6): 15 mL via OROMUCOSAL

## 2019-07-28 MED ORDER — HEPARIN SODIUM (PORCINE) 5000 UNIT/ML IJ SOLN
5000.0000 [IU] | Freq: Three times a day (TID) | INTRAMUSCULAR | Status: DC
  Administered 2019-07-28 – 2019-08-04 (×23): 5000 [IU] via SUBCUTANEOUS
  Filled 2019-07-28 (×23): qty 1

## 2019-07-28 MED ORDER — FENTANYL CITRATE (PF) 100 MCG/2ML IJ SOLN: 12.5000 ug | INTRAMUSCULAR | Status: DC | PRN

## 2019-07-28 MED ORDER — PRO-STAT SUGAR FREE PO LIQD: 30.0000 mL | Freq: Two times a day (BID) | ORAL | Status: DC

## 2019-07-28 MED ORDER — DOCUSATE SODIUM 50 MG/5ML PO LIQD: 100.0000 mg | Freq: Two times a day (BID) | ORAL | Status: DC | PRN

## 2019-07-28 MED ORDER — FENTANYL CITRATE (PF) 100 MCG/2ML IJ SOLN
50.0000 ug | INTRAMUSCULAR | Status: DC | PRN
  Administered 2019-07-28 (×2): 100 ug via INTRAVENOUS
  Administered 2019-07-28: 50 ug via INTRAVENOUS
  Filled 2019-07-28: qty 2
  Filled 2019-07-28 (×2): qty 4

## 2019-07-28 MED ORDER — SODIUM CHLORIDE 0.9 % IV SOLN
1.5000 g | Freq: Once | INTRAVENOUS | Status: DC
  Filled 2019-07-28: qty 4

## 2019-07-28 MED ORDER — CALCIUM GLUCONATE-NACL 1-0.675 GM/50ML-% IV SOLN
1.0000 g | Freq: Once | INTRAVENOUS | Status: AC
  Administered 2019-07-28: 1000 mg via INTRAVENOUS
  Filled 2019-07-28: qty 50

## 2019-07-28 MED ORDER — CHLORHEXIDINE GLUCONATE CLOTH 2 % EX PADS
6.0000 | MEDICATED_PAD | Freq: Every day | CUTANEOUS | Status: DC
  Administered 2019-07-28 – 2019-08-05 (×10): 6 via TOPICAL

## 2019-07-28 MED ORDER — INSULIN ASPART 100 UNIT/ML ~~LOC~~ SOLN
0.0000 [IU] | SUBCUTANEOUS | Status: DC
  Administered 2019-07-28: 1 [IU] via SUBCUTANEOUS
  Administered 2019-07-28: 2 [IU] via SUBCUTANEOUS
  Administered 2019-07-29 – 2019-07-30 (×6): 1 [IU] via SUBCUTANEOUS
  Administered 2019-07-30: 2 [IU] via SUBCUTANEOUS

## 2019-07-28 MED ORDER — ALBUMIN HUMAN 5 % IV SOLN
25.0000 g | Freq: Once | INTRAVENOUS | Status: DC
  Filled 2019-07-28: qty 250
  Filled 2019-07-28: qty 500

## 2019-07-28 MED ORDER — MIDAZOLAM HCL 2 MG/2ML IJ SOLN
2.0000 mg | INTRAMUSCULAR | Status: AC | PRN
  Administered 2019-07-28 – 2019-07-29 (×3): 2 mg via INTRAVENOUS
  Filled 2019-07-28 (×4): qty 2

## 2019-07-28 MED ORDER — LACTATED RINGERS IV BOLUS
500.0000 mL | Freq: Once | INTRAVENOUS | Status: AC
  Administered 2019-07-28: 500 mL via INTRAVENOUS

## 2019-07-28 MED ORDER — VASOPRESSIN 20 UNIT/ML IV SOLN
0.0300 [IU]/min | INTRAVENOUS | Status: DC
  Filled 2019-07-28: qty 2

## 2019-07-28 MED ORDER — SODIUM CHLORIDE 0.9 % IV BOLUS
1000.0000 mL | Freq: Once | INTRAVENOUS | Status: AC
  Administered 2019-07-28: 1000 mL via INTRAVENOUS

## 2019-07-28 MED ORDER — BISACODYL 10 MG RE SUPP: 10.0000 mg | Freq: Every day | RECTAL | Status: DC | PRN

## 2019-07-28 MED ORDER — COLLAGENASE 250 UNIT/GM EX OINT
TOPICAL_OINTMENT | Freq: Every day | CUTANEOUS | Status: DC
  Administered 2019-07-28 – 2019-08-01 (×5): via TOPICAL
  Administered 2019-08-02: 1 via TOPICAL
  Administered 2019-08-04: 03:00:00 via TOPICAL
  Administered 2019-08-04 – 2019-08-05 (×2): 1 via TOPICAL
  Administered 2019-08-07 – 2019-08-08 (×2): via TOPICAL
  Filled 2019-07-28: qty 30

## 2019-07-28 MED ORDER — MEMANTINE HCL ER 28 MG PO CP24: 28.0000 mg | ORAL_CAPSULE | Freq: Every day | ORAL | Status: DC

## 2019-07-28 MED ORDER — SODIUM CHLORIDE 0.9 % IV SOLN
2.0000 g | Freq: Two times a day (BID) | INTRAVENOUS | Status: DC
  Administered 2019-07-28 – 2019-07-29 (×2): 2 g via INTRAVENOUS
  Filled 2019-07-28 (×2): qty 2

## 2019-07-28 MED ORDER — LORAZEPAM 2 MG/ML IJ SOLN
0.5000 mg | Freq: Once | INTRAMUSCULAR | Status: DC
  Filled 2019-07-28: qty 1

## 2019-07-28 MED ORDER — LACTATED RINGERS IV SOLN
INTRAVENOUS | Status: AC
  Administered 2019-07-28 – 2019-07-29 (×2): via INTRAVENOUS

## 2019-07-28 MED ORDER — LACTATED RINGERS IV SOLN
INTRAVENOUS | Status: DC
  Administered 2019-07-30: 05:00:00 via INTRAVENOUS

## 2019-07-28 MED ORDER — PIPERACILLIN-TAZOBACTAM 3.375 G IVPB: 3.3750 g | Freq: Three times a day (TID) | INTRAVENOUS | Status: DC

## 2019-07-28 MED ORDER — VITAL AF 1.2 CAL PO LIQD
1000.0000 mL | ORAL | Status: DC
  Administered 2019-07-28 – 2019-07-30 (×2): 1000 mL

## 2019-07-28 MED ORDER — GLYCOPYRROLATE 0.2 MG/ML IJ SOLN: 0.1000 mg | Freq: Once | INTRAMUSCULAR | Status: DC

## 2019-07-28 NOTE — Consult Note (Addendum)
Urology Consult   Physician requesting consult: Laurelyn Sickle MD  Reason for consult: Urinary retention  History of Present Illness: Brian Stanton is a 57 y.o. male with history of Down syndrome presenting from rehab/SNF with sepsis and respiratory failure in the setting of aspiration pneumonia.  Urology was consulted for assistance with placement of Foley catheter given need for strict ins and outs.  Upon urology request, prior to catheter placement bladder scan was performed and revealed over a liter in the bladder.   History limited as patient is intubated and has intellectual disability baseline.  On chart review, I do not see history of urinary retention or neurogenic bladder.  [facility], it appears that patient is known to have a congenital UPJ obstruction and urge incontinence, for which she was last seen in 2016 at which point in time he was taking Myrbetriq.  No urology notes after that date available to me.   Past Medical History:  Diagnosis Date  . Down's syndrome   . GERD (gastroesophageal reflux disease)   . Mental retardation     Past Surgical History:  Procedure Laterality Date  . HIP ARTHROPLASTY Right 07/06/2019   Procedure: ARTHROPLASTY BIPOLAR HIP (HEMIARTHROPLASTY);  Surgeon: Marybelle Killings, MD;  Location: [facility];  Service: Orthopedics;  Laterality: Right;  . INTRAMEDULLARY (IM) NAIL INTERTROCHANTERIC Left 07/06/2019   Procedure: INTRAMEDULLARY (IM) NAIL INTERTROCHANTRIC;  Surgeon: Marybelle Killings, MD;  Location: [facility];  Service: Orthopedics;  Laterality: Left;    Current Hospital Medications:  Home Meds:  No current facility-administered medications on file prior to encounter.    Current Outpatient Medications on File Prior to Encounter  Medication Sig Dispense Refill  . acetaminophen (TYLENOL) 325 MG tablet Take 2 tablets (650 mg total) by mouth every 6 (six) hours as needed for mild pain. 30 tablet 0  . aspirin EC 325 MG EC tablet Take  1 tablet (325 mg total) by mouth daily with breakfast. 30 tablet 0  . cholecalciferol (VITAMIN D3) 25 MCG (1000 UT) tablet Take 1,000 Units by mouth daily.    Marland Kitchen docusate sodium (COLACE) 100 MG capsule Take 100 mg by mouth 2 (two) times daily.    Marland Kitchen donepezil (ARICEPT) 5 MG tablet Take 5 mg by mouth daily.    Marland Kitchen guaiFENesin (MUCINEX) 600 MG 12 hr tablet Take 600 mg by mouth 2 (two) times daily.    . hydrocortisone (ANUSOL-HC) 25 MG suppository Place 25 mg rectally 3 (three) times daily as needed for hemorrhoids.     . Infant Care Products (BABY SHAMPOO EX) Apply 1 application topically daily. Use to wash eyes and face daily    . iron polysaccharides (NIFEREX) 150 MG capsule Take 1 capsule (150 mg total) by mouth daily. 30 capsule 0  . levothyroxine (SYNTHROID) 88 MCG tablet Take 88 mcg by mouth daily before breakfast.    . lubiprostone (AMITIZA) 24 MCG capsule Take 24 mcg by mouth 2 (two) times a day.    . Melatonin 3 MG TABS Take 3 mg by mouth at bedtime.    . memantine (NAMENDA XR) 28 MG CP24 24 hr capsule Take 28 mg by mouth daily.    . mirabegron ER (MYRBETRIQ) 25 MG TB24 tablet Take 25 mg by mouth daily. At 4 pm     . ondansetron (ZOFRAN) 4 MG tablet Take 1 tablet (4 mg total) by mouth every 6 (six) hours as needed for nausea. 20 tablet 0  . Probiotic Product (ALIGN  PO) Take 1 capsule by mouth every evening.    . promethazine (PHENERGAN) 25 MG suppository Place 25 mg rectally every 4 (four) hours as needed for nausea or vomiting.    . Sunscreens (BLISTEX LIP BALM EX) Apply 1 application topically 3 (three) times daily.    . traMADol (ULTRAM) 50 MG tablet Take 1 tablet (50 mg total) by mouth every 12 (twelve) hours as needed for moderate pain. 10 tablet 0  . traZODone (DESYREL) 50 MG tablet Take 25 mg by mouth at bedtime.       Scheduled Meds: . chlorhexidine gluconate (MEDLINE KIT)  15 mL Mouth Rinse BID  . Chlorhexidine Gluconate Cloth  6 each Topical Q0600  . heparin  5,000 Units  Subcutaneous Q8H  . insulin aspart  0-9 Units Subcutaneous Q4H  . levothyroxine  88 mcg Per Tube Q0600  . LORazepam  0.5 mg Intravenous Once  . mouth rinse  15 mL Mouth Rinse 10 times per day  . pantoprazole (PROTONIX) IV  40 mg Intravenous Daily  . vancomycin variable dose per unstable renal function (pharmacist dosing)   Does not apply See admin instructions   Continuous Infusions: . piperacillin-tazobactam (ZOSYN)  IV    . sodium chloride     And  . sodium chloride     PRN Meds:.bisacodyl, docusate, fentaNYL (SUBLIMAZE) injection, fentaNYL (SUBLIMAZE) injection, ipratropium-albuterol, midazolam, midazolam  Allergies: No Known Allergies  Family History  Problem Relation Age of Onset  . Diabetes Mellitus II Neg Hx     Social History:  reports that he has never smoked. He has never used smokeless tobacco. He reports that he does not drink alcohol or use drugs.  ROS: A complete review of systems was performed.  All systems are negative except for pertinent findings as noted.  Physical Exam:  Vital signs in last 24 hours: Temp:  [98.3 F (36.8 C)-100.1 F (37.8 C)] 98.3 F (36.8 C) (10/15 0748) Pulse Rate:  [75-109] 82 (10/15 0600) Resp:  [13-33] 15 (10/15 0600) BP: (51-123)/(39-74) 116/74 (10/15 0600) SpO2:  [87 %-100 %] 98 % (10/15 0619) FiO2 (%):  [70 %-100 %] 100 % (10/15 0619) Weight:  [54.4 kg] 54.4 kg (10/15 0203) Constitutional: Acutely ill-appearing.  Alert, intolerant of exam without restraints/ RN assistance with restraint Cardiovascular: Tachycardic Respiratory: Intubated GI: Abdomen is soft GU: Circumcised male phallus, narrow meatusy Neurologic: Confused, non verbal currently, unclear how different from baseline.  Laboratory Data:  Recent Labs    07/28/19 0301 07/28/19 0605  WBC 17.8*  --   HGB 13.3 13.9  HCT 43.4 41.0  PLT 488*  --     Recent Labs    07/28/19 0301 07/28/19 0605  NA 141 146*  K 3.3* 3.3*  CL 99  --   GLUCOSE 180*  --   BUN  22*  --   CALCIUM 7.9*  --   CREATININE 1.46*  --      Results for orders placed or performed during the hospital encounter of 07/28/19 (from the past 24 hour(s))  Lactic acid, plasma     Status: Abnormal   Collection Time: 07/28/19  3:01 AM  Result Value Ref Range   Lactic Acid, Venous 4.5 (HH) 0.5 - 1.9 mmol/L  Comprehensive metabolic panel     Status: Abnormal   Collection Time: 07/28/19  3:01 AM  Result Value Ref Range   Sodium 141 135 - 145 mmol/L   Potassium 3.3 (L) 3.5 - 5.1 mmol/L   Chloride 99 98 -  111 mmol/L   CO2 27 22 - 32 mmol/L   Glucose, Bld 180 (H) 70 - 99 mg/dL   BUN 22 (H) 6 - 20 mg/dL   Creatinine, Ser 1.46 (H) 0.61 - 1.24 mg/dL   Calcium 7.9 (L) 8.9 - 10.3 mg/dL   Total Protein 5.7 (L) 6.5 - 8.1 g/dL   Albumin 2.0 (L) 3.5 - 5.0 g/dL   AST 31 15 - 41 U/L   ALT 14 0 - 44 U/L   Alkaline Phosphatase 98 38 - 126 U/L   Total Bilirubin 0.6 0.3 - 1.2 mg/dL   GFR calc non Af Amer 52 (L) >60 mL/min   GFR calc Af Amer >60 >60 mL/min   Anion gap 15 5 - 15  CBC WITH DIFFERENTIAL     Status: Abnormal   Collection Time: 07/28/19  3:01 AM  Result Value Ref Range   WBC 17.8 (H) 4.0 - 10.5 K/uL   RBC 4.14 (L) 4.22 - 5.81 MIL/uL   Hemoglobin 13.3 13.0 - 17.0 g/dL   HCT 43.4 39.0 - 52.0 %   MCV 104.8 (H) 80.0 - 100.0 fL   MCH 32.1 26.0 - 34.0 pg   MCHC 30.6 30.0 - 36.0 g/dL   RDW 16.8 (H) 11.5 - 15.5 %   Platelets 488 (H) 150 - 400 K/uL   nRBC 0.0 0.0 - 0.2 %   Neutrophils Relative % 94 %   Neutro Abs 16.9 (H) 1.7 - 7.7 K/uL   Lymphocytes Relative 2 %   Lymphs Abs 0.3 (L) 0.7 - 4.0 K/uL   Monocytes Relative 2 %   Monocytes Absolute 0.3 0.1 - 1.0 K/uL   Eosinophils Relative 0 %   Eosinophils Absolute 0.0 0.0 - 0.5 K/uL   Basophils Relative 1 %   Basophils Absolute 0.1 0.0 - 0.1 K/uL   Immature Granulocytes 1 %   Abs Immature Granulocytes 0.10 (H) 0.00 - 0.07 K/uL  APTT     Status: None   Collection Time: 07/28/19  3:01 AM  Result Value Ref Range   aPTT 32 24  - 36 seconds  Protime-INR     Status: Abnormal   Collection Time: 07/28/19  3:01 AM  Result Value Ref Range   Prothrombin Time 16.7 (H) 11.4 - 15.2 seconds   INR 1.4 (H) 0.8 - 1.2  Lipase, blood     Status: None   Collection Time: 07/28/19  3:01 AM  Result Value Ref Range   Lipase 46 11 - 51 U/L  SARS Coronavirus 2 by RT PCR (hospital order, performed in [place] hospital lab) Nasopharyngeal Nasopharyngeal Swab     Status: None   Collection Time: 07/28/19  3:28 AM   Specimen: Nasopharyngeal Swab  Result Value Ref Range   SARS Coronavirus 2 NEGATIVE NEGATIVE  Glucose, capillary     Status: Abnormal   Collection Time: 07/28/19  5:45 AM  Result Value Ref Range   Glucose-Capillary 143 (H) 70 - 99 mg/dL   Comment 1 Notify RN   I-STAT 7, (LYTES, BLD GAS, ICA, H+H)     Status: Abnormal   Collection Time: 07/28/19  6:05 AM  Result Value Ref Range   pH, Arterial 7.276 (L) 7.350 - 7.450   pCO2 arterial 50.1 (H) 32.0 - 48.0 mmHg   pO2, Arterial 69.0 (L) 83.0 - 108.0 mmHg   Bicarbonate 23.5 20.0 - 28.0 mmol/L   TCO2 25 22 - 32 mmol/L   O2 Saturation 91.0 %   Acid-base deficit  4.0 (H) 0.0 - 2.0 mmol/L   Sodium 146 (H) 135 - 145 mmol/L   Potassium 3.3 (L) 3.5 - 5.1 mmol/L   Calcium, Ion 1.10 (L) 1.15 - 1.40 mmol/L   HCT 41.0 39.0 - 52.0 %   Hemoglobin 13.9 13.0 - 17.0 g/dL   Patient temperature 97.7 F    Collection site RADIAL, ALLEN'S TEST ACCEPTABLE    Drawn by RT    Sample type ARTERIAL   Lactic acid, plasma     Status: Abnormal   Collection Time: 07/28/19  6:38 AM  Result Value Ref Range   Lactic Acid, Venous 5.9 (HH) 0.5 - 1.9 mmol/L  Hemoglobin A1c     Status: Abnormal   Collection Time: 07/28/19  6:38 AM  Result Value Ref Range   Hgb A1c MFr Bld 4.7 (L) 4.8 - 5.6 %   Mean Plasma Glucose 88.19 mg/dL  Magnesium     Status: None   Collection Time: 07/28/19  6:38 AM  Result Value Ref Range   Magnesium 2.2 1.7 - 2.4 mg/dL  Phosphorus     Status: None   Collection Time:  07/28/19  6:38 AM  Result Value Ref Range   Phosphorus 3.6 2.5 - 4.6 mg/dL  Glucose, capillary     Status: Abnormal   Collection Time: 07/28/19  7:46 AM  Result Value Ref Range   Glucose-Capillary 144 (H) 70 - 99 mg/dL   Recent Results (from the past 240 hour(s))  SARS Coronavirus 2 by RT PCR (hospital order, performed in [place] hospital lab) Nasopharyngeal Nasopharyngeal Swab     Status: None   Collection Time: 07/28/19  3:28 AM   Specimen: Nasopharyngeal Swab  Result Value Ref Range Status   SARS Coronavirus 2 NEGATIVE NEGATIVE Final    Comment: (NOTE) If result is NEGATIVE SARS-CoV-2 target nucleic acids are NOT DETECTED. The SARS-CoV-2 RNA is generally detectable in upper and lower  respiratory specimens during the acute phase of infection. The lowest  concentration of SARS-CoV-2 viral copies this assay can detect is 250  copies / mL. A negative result does not preclude SARS-CoV-2 infection  and should not be used as the sole basis for treatment or other  patient management decisions.  A negative result may occur with  improper specimen collection / handling, submission of specimen other  than nasopharyngeal swab, presence of viral mutation(s) within the  areas targeted by this assay, and inadequate number of viral copies  (<250 copies / mL). A negative result must be combined with clinical  observations, patient history, and epidemiological information. If result is POSITIVE SARS-CoV-2 target nucleic acids are DETECTED. The SARS-CoV-2 RNA is generally detectable in upper and lower  respiratory specimens dur ing the acute phase of infection.  Positive  results are indicative of active infection with SARS-CoV-2.  Clinical  correlation with patient history and other diagnostic information is  necessary to determine patient infection status.  Positive results do  not rule out bacterial infection or co-infection with other viruses. If result is PRESUMPTIVE  POSTIVE SARS-CoV-2 nucleic acids MAY BE PRESENT.   A presumptive positive result was obtained on the submitted specimen  and confirmed on repeat testing.  While 2019 novel coronavirus  (SARS-CoV-2) nucleic acids may be present in the submitted sample  additional confirmatory testing may be necessary for epidemiological  and / or clinical management purposes  to differentiate between  SARS-CoV-2 and other Sarbecovirus currently known to infect humans.  If clinically indicated additional testing with an  alternate test  methodology [id number]) is advised. The SARS-CoV-2 RNA is generally  detectable in upper and lower respiratory sp ecimens during the acute  phase of infection. The expected result is Negative. Fact Sheet for Patients:  StrictlyIdeas.no Fact Sheet for Healthcare Providers: BankingDealers.co.za This test is not yet approved or cleared by the [place] FDA and has been authorized for detection and/or diagnosis of SARS-CoV-2 by FDA under an Emergency Use Authorization (EUA).  This EUA will remain in effect (meaning this test can be used) for the duration of the COVID-19 declaration under Section 564(b)(1) of the Act, 21 U.S.C. section 360bbb-3(b)(1), unless the authorization is terminated or revoked sooner. Performed at [facility], [place] [address], [place], [place] [address]     Renal Function: Recent Labs    07/28/19 0301  CREATININE 1.46*   Estimated Creatinine Clearance: 40.3 mL/min (A) (by C-G formula based on SCr of 1.46 mg/dL (H)).  Radiologic Imaging: Dg Chest Port 1 View  Result Date: 07/28/2019 CLINICAL DATA:  Hypoxia; ET and OG placement EXAM: PORTABLE CHEST 1 VIEW COMPARISON:  Radiograph 07/08/2019 FINDINGS: Endotracheal tube is positioned low within the trachea terminating 1.5 cm from the carina. Transesophageal tube tip and side port distal to the GE junction directed towards the gastric antrum.  Widespread interstitial and airspace opacities present throughout the right hemithorax and minimally in the left lung base. No pneumothorax or visible effusion. Stable calcified granulomata in the hila. Aortic calcification is noted as well. Cardiomediastinal contours are otherwise unremarkable. No acute osseous or soft tissue abnormality. IMPRESSION: 1. Endotracheal tube is positioned low within the trachea terminating 1.5 cm from the carina. Recommend retraction 2-3 cm to the mid trachea. 2. Transesophageal tube tip and side port distal to the GE junction. 3. Widespread interstitial and airspace opacities throughout the right hemithorax and minimally in the left lung base, could reflect pneumonia or sequela extensive aspiration. These results were called by telephone at the time of interpretation on 07/28/2019 at 4:05 am to provider [facility] , who verbally acknowledged these results. Electronically Signed   By: Lovena Le M.D.   On: 07/28/2019 04:06   Dg Hip Unilat W Or Wo Pelvis 1 View Left  Result Date: 07/28/2019 CLINICAL DATA:  Left hip deformity EXAM: DG HIP (WITH OR WITHOUT PELVIS) 1V*L* COMPARISON:  Radiographs 07/06/2019 FINDINGS: There has been interval re-injury of the left femoral neck now with a highly comminuted periprosthetic fracture pattern extending through the left femoral neck and into the inter trochanteric region of the left femur. Transcervical fixation screw now stands proud of the bone cortex. The lower extremity is varus angulated with extensive overlying soft tissue swelling and a large effusion. Overlying skin staples are noted as well. There are additional postsurgical changes of the right hip post placement a right hip hemiarthroplasty for a transcervical fracture seen comparison images. Articular component is normally located though alignment is incompletely assessed on non dedicated radiographs. IMPRESSION: Comminuted periprosthetic fracture/re-injury of the left  femoral neck and intertrochanteric region with large effusion and overlying swelling. Transcervical fixation hardware stands proud of the cortex. Normal location of the right hip hemiarthroplasty with recent postsurgical changes. Alignment is suboptimally evaluated on these nondedicated images and given patient rotation. These results were called by telephone at the time of interpretation on 07/28/2019 at 4:11 am to provider [facility] , who verbally acknowledged these results. Electronically Signed   By: Lovena Le M.D.   On: 07/28/2019 04:12    I  independently reviewed the above imaging studies.  Impression/Recommendation 58 yo M presenting with acute respiratory failure and sepsis in setting of aspiration pneumonia now status post placement of 12 French Foley catheter by urology MD at bedside on 07/28/2019.  Given urinary retention, catheter should be left in place for at least 7 days.  After that time, we will defer catheter removal and trial of void to primary team or rehab/SNF facility if strict ins and outs are no longer needed.  There was no difficulty with passage of 12 French Foley catheter, and he should not need urologist for future catheter placements.  He may have some mild narrowing just proximal to his meatus.  However, if catheter is attempted in the future and 13 [place] is unable to pass, then would recommend RN place 12 French catheter as this passed easily without resistance throughout the length of the urethra and into the bladder with good return of urine today.  It is unclear if he has only acute urinary retention in setting of illness or if he has chronic retention in the setting of intellectual disability at baseline.  In addition, it appears that he has longstanding congenital UPJ obstruction as well as a history of urinary incontinence last evaluated by our group at [facility] in 2016.  Given recent urinary retention and prior urologic issues with possible loss to  follow up, recommend that outpatient referral to urology be placed upon discharge for further evaluation of urinary retention and bladder dysfunction   Brian Stanton 07/28/2019, 8:07 AM   I have reviewed the above note, and have seen the patient.  I agree with the above plan.

## 2019-07-28 NOTE — ED Notes (Signed)
EDP notified of critical lab value for [place]

## 2019-07-28 NOTE — Progress Notes (Addendum)
PCCM Brief Communication Note  4 attempts made to contact Brian Stanton, sister, to provide updates after return from CT-- however am met with busy signal and unable to connect.  Will attempt to reach later this afternoon if able.    _____________________________________________ Update [id number]  Sister Brian Stanton at bedside.  I arrived to provide updated regarding CT scan, current plan of care. We discussed orthopedic evaluation and possible need for further intervention, to be determined by ortho. We discussed the patient's PNA, possible chronic aspiration as the patient "chockes and gags" when he eats if he is anxious. We discussed the patients hypotension and current use of Levophed. We discussed the potential of ICU procedures-- Vaughan Basta expresses worry at the idea of a central line stating "if he has anything put in him like that he will try to pull it out." If indicated, she is unsure if she would proceed with CVC placement.   We will continue with peripheral pressors at present time with MAP goal 60, minimizing sedation as able and monitoring UOP.  Patient remains full code.  All questions answered.   Eliseo Gum MSN, AGACNP-BC [place] [phone] If no answer, [phone] 07/28/2019, 2:52 PM

## 2019-07-28 NOTE — Progress Notes (Signed)
Summary- pt rec'd from ER via stretcher, moved to bed x 4 assist. Pt non responsive to painful stimulation. Pt eyes open, pupils reactive, but no tracking of staff movement noted. All extremities flaccid, no effort made against gravity. Order noted for foley catheter, insertion unsuccessful by 2 RN's . Elink MD called for urology consult, request made  for bladder scan. Scan revealed > 999 cc, order for consult noted.Vital signs stable on vent at 100% O2.

## 2019-07-28 NOTE — Progress Notes (Signed)
Pharmacy Antibiotic Note  Brian Stanton is a 58 y.o. male admitted on 07/28/2019 with sepsis.  Patient received one dose of Vancomycin 1g IV and Cefepime 2g IV in the ED early this morning. Zosyn was ordered but never started.  Pharmacy has been consulted to change Vancomycin and Zosyn to Cefepime given renal function and negative MRSA PCR.  WBC elevated at 17.8, lactate also elevated at 5.9. Remains afebrile. CXR today shows widespace interstitial and airspace opacities reflecting PNA or extensive aspiration. Patient has urinary retention and is in AKI with elevated Scr of 1.46, baseline ~0.8.   Plan: Stop Zosyn and vancomycin Restart Cefepime 2 g IV Q12hrs Monitor renal function, cultures and sensitivities, and clinical progression  Height: 5\' 1"  (154.9 cm) Weight: 120 lb (54.4 kg) IBW/kg (Calculated) : 52.3  Temp (24hrs), Avg:99.2 F (37.3 C), Min:98.3 F (36.8 C), Max:100.1 F (37.8 C)  Recent Labs  Lab 07/28/19 0301 07/28/19 0638  WBC 17.8*  --   CREATININE 1.46*  --   LATICACIDVEN 4.5* 5.9*    Estimated Creatinine Clearance: 40.3 mL/min (A) (by C-G formula based on SCr of 1.46 mg/dL (H)).    No Known Allergies  Antimicrobials this admission: Vancomycin 10/15 x1 Cefepime 10/15 >> Zosyn never given  Dose adjustments this admission: None  Microbiology results: 10/15 Bcx: sent 10/15 Sputum: sent 10/15 MRSA PCR: neg  Richardine Service, PharmD PGY1 Pharmacy Resident Phone: [phone] 07/28/2019  11:29 AM  Please check [email or web address] for unit-specific pharmacy phone numbers.

## 2019-07-28 NOTE — Consult Note (Signed)
[place] Nurse wound consult note Reason for Consult:unstageable pressure injury to posterior scrotum in the setting of urinary incontinence.  Wound type:moisture and pressure Pressure Injury POA: Yes Measurement: 2 cm x 2 cm  Wound bed:100% slough Drainage (amount, consistency, odor) minimal serosanguinous  No odor Periwound:intact Dressing procedure/placement/frequency: Cleanse scrotum with NS and pat dry.  Apply Santyl to wound bed.  Cover with NS moist gauze.  Secure with silicone foam and kerlix if needed  Change daily Will not follow at this time.  Please re-consult if needed.  Domenic Moras MSN, RN, FNP-BC [facility] Wound, Ostomy, Continence Nurse Pager [phone]

## 2019-07-28 NOTE — Progress Notes (Signed)
Transported patient from ED to 2M07 without event.

## 2019-07-28 NOTE — Progress Notes (Signed)
Initial Nutrition Assessment  DOCUMENTATION CODES:   Severe malnutrition in context of acute illness/injury  INTERVENTION:    Vital AF 1.2 at 30 ml/h, increase by 10 ml every 4 hours to goal rate of 60 ml/h (1440 ml per day)   Provides 1728 kcal, 108 gm protein, 1168 ml free water daily   Monitor magnesium, potassium, and phosphorus; MD to replete as needed, as pt is at risk for refeeding syndrome given severe PCM with no intake for the past 7 days.   NUTRITION DIAGNOSIS:   Severe Malnutrition related to acute illness(hip fracture) as evidenced by percent weight loss, energy intake < or equal to 50% for > or equal to 5 days, moderate fat depletion, moderate muscle depletion(8% weight loss within 1 month).  GOAL:   Patient will meet greater than or equal to 90% of their needs  MONITOR:   Vent status, TF tolerance, Skin, Labs  REASON FOR ASSESSMENT:   Ventilator, Consult Enteral/tube feeding initiation and management  ASSESSMENT:   58 yo male admitted with N/V/D, sepsis, aspiration PNA, hypoxic respiratory failure requiring intubation. Found to have refracture of L hip. PMH includes Downs syndrome, recent L hip fracture s/p IM nailing in September, d/c to SNF for rehab.   Received MD Consult for TF initiation and management. OG tube in place.  Patient is currently intubated on ventilator support MV: 13.1 L/min Temp (24hrs), Avg:98.9 F (37.2 C), Min:97.8 F (36.6 C), Max:100.1 F (37.8 C)   Labs reviewed. Sodium 146 (H), potassium 3.3 (L), ionized calcium 1.1 (L) CBG's: 144-109  Medications reviewed and include calcium gluconate, novolog, precedex, levophed.  Weight encounters reviewed. Patient has lost 8% of usual weight since last admission <1 month ago.  Per RN discussion with patient's sister, patient has not eaten or drank anything in the past 7 days.  NUTRITION - FOCUSED PHYSICAL EXAM:    Most Recent Value  Orbital Region  Moderate depletion  Upper  Arm Region  Moderate depletion  Thoracic and Lumbar Region  Unable to assess  Buccal Region  Unable to assess  Temple Region  Moderate depletion  Clavicle Bone Region  Mild depletion  Clavicle and Acromion Bone Region  Mild depletion  Scapular Bone Region  Unable to assess  Dorsal Hand  No depletion  Patellar Region  Mild depletion  Anterior Thigh Region  Mild depletion  Posterior Calf Region  Mild depletion  Edema (RD Assessment)  None  Hair  Reviewed  Eyes  Unable to assess  Mouth  Unable to assess  Skin  Reviewed  Nails  Reviewed       Diet Order:   Diet Order            Diet NPO time specified  Diet effective now              EDUCATION NEEDS:   Not appropriate for education at this time  Skin:  Skin Assessment: Skin Integrity Issues: Skin Integrity Issues:: Unstageable, Stage II Stage II: buttocks  Pressure injury to scrotum  Last BM:  No BM documented  Height:   Ht Readings from Last 1 Encounters:  07/28/19 5\' 1"  (1.549 m)    Weight:   Wt Readings from Last 1 Encounters:  07/28/19 54.4 kg    Ideal Body Weight:  50.9 kg  BMI:  Body mass index is 22.67 kg/m.  Estimated Nutritional Needs:   Kcal:  3559  Protein:  80-95 gm  Fluid:  >/= 1.7 L    Joelene Millin  Tiburcio Pea, RD, LDN, CNSC Pager [phone] After Hours Pager [phone]

## 2019-07-28 NOTE — Progress Notes (Signed)
[place] Progress Note Patient Name: Brian Stanton DOB: 1960-10-22 MRN: [id number]   Date of Service  07/28/2019  HPI/Events of Note  RN unable to pass foley catheter  eICU Interventions  Urology consultation        Frederik Pear 07/28/2019, 6:03 AM

## 2019-07-28 NOTE — ED Provider Notes (Signed)
[facility] EMERGENCY DEPARTMENT Provider Note   CSN: [id number] Arrival date & time: 07/28/19  0157     History   Chief Complaint Chief Complaint  Patient presents with  . Nausea  . Diarrhea    HPI Brian Stanton is a 58 y.o. male.     HPI  This is a 58 year old male with a history of Down syndrome, reflux, recent history of hip fracture who presents from his rehab with nausea, vomiting, and diarrhea.  History is very limited.  Per EMS, he has had frequent episodes of nausea, vomiting, and diarrhea.  Concern for aspiration.  Noted to be hypotensive to 90/60 in route.  He was given 500 cc of fluid.  He was satting 90% on 4 L of oxygen.  He received Haldol for agitation.  Reviewed his recent chart.  He is full code.  He recently 07/05/2019 was admitted for hip fracture.  He actually had a remote hip fracture on the opposite side that was also repaired.  Attempted to contact his legal guardian, his sister.  However, did not receive an answer.  I did discuss with his other sister who gave me collateral information.  At baseline he does have some dementia and "few words."  However, she states that he is pleasant and interactive.  He had been doing very well living in his living facility over the last 10 years.  Just recently in rehab after these hip fractures.  She confirms that he is full code.  Please avoid calling his mother who is demented as well.  Primary contacts are his sisters [name] and or Terressa Koyanagi [phone] Darel Hong [phone]  Past Medical History:  Diagnosis Date  . Down's syndrome   . GERD (gastroesophageal reflux disease)   . Mental retardation     Patient Active Problem List   Diagnosis Date Noted  . Anemia due to acute blood loss 07/09/2019    Class: Acute  . Closed intertrochanteric fracture of hip, left, initial encounter (HCC) 07/05/2019  . Closed displaced fracture of right femoral neck (HCC) 07/05/2019  . Hip fracture (HCC) 07/05/2019  .  Hypothyroidism 07/05/2019  . Closed nondisplaced intertrochanteric fracture of left femur (HCC) 07/05/2019    Past Surgical History:  Procedure Laterality Date  . HIP ARTHROPLASTY Right 07/06/2019   Procedure: ARTHROPLASTY BIPOLAR HIP (HEMIARTHROPLASTY);  Surgeon: [name], MD;  Location: [facility];  Service: Orthopedics;  Laterality: Right;  . INTRAMEDULLARY (IM) NAIL INTERTROCHANTERIC Left 07/06/2019   Procedure: INTRAMEDULLARY (IM) NAIL INTERTROCHANTRIC;  Surgeon: [name], MD;  Location: [facility];  Service: Orthopedics;  Laterality: Left;        Home Medications    Prior to Admission medications   Medication Sig Start Date End Date Taking? Authorizing Provider  acetaminophen (TYLENOL) 325 MG tablet Take 2 tablets (650 mg total) by mouth every 6 (six) hours as needed for mild pain. 07/12/19   Marguerita Merles Latif, DO  aspirin EC 325 MG EC tablet Take 1 tablet (325 mg total) by mouth daily with breakfast. 07/13/19   Marguerita Merles Latif, DO  cholecalciferol (VITAMIN D3) 25 MCG (1000 UT) tablet Take 1,000 Units by mouth daily.    [provider]  docusate sodium (COLACE) 100 MG capsule Take 100 mg by mouth 2 (two) times daily.    [provider]  donepezil (ARICEPT) 5 MG tablet Take 5 mg by mouth daily.    [provider]  guaiFENesin (MUCINEX) 600 MG 12 hr tablet  Take 600 mg by mouth 2 (two) times daily.    [provider]  hydrocortisone (ANUSOL-HC) 25 MG suppository Place 25 mg rectally 3 (three) times daily as needed for hemorrhoids.     [provider]  Infant Care Products (BABY SHAMPOO EX) Apply 1 application topically daily. Use to wash eyes and face daily    [provider]  iron polysaccharides (NIFEREX) 150 MG capsule Take 1 capsule (150 mg total) by mouth daily. 07/12/19   Marguerita MerlesSheikh, Omair Latif, DO  levothyroxine (SYNTHROID) 88 MCG tablet Take 88 mcg by mouth daily before breakfast.    [provider]  lubiprostone  (AMITIZA) 24 MCG capsule Take 24 mcg by mouth 2 (two) times a day.    [provider]  Melatonin 3 MG TABS Take 3 mg by mouth at bedtime.    [provider]  memantine (NAMENDA XR) 28 MG CP24 24 hr capsule Take 28 mg by mouth daily.    [provider]  mirabegron ER (MYRBETRIQ) 25 MG TB24 tablet Take 25 mg by mouth daily. At 4 pm     [provider]  ondansetron (ZOFRAN) 4 MG tablet Take 1 tablet (4 mg total) by mouth every 6 (six) hours as needed for nausea. 07/12/19   Marguerita MerlesSheikh, Omair Latif, DO  Probiotic Product (ALIGN PO) Take 1 capsule by mouth every evening.    [provider]  promethazine (PHENERGAN) 25 MG suppository Place 25 mg rectally every 4 (four) hours as needed for nausea or vomiting.    [provider]  Sunscreens (BLISTEX LIP BALM EX) Apply 1 application topically 3 (three) times daily.    [provider]  traMADol (ULTRAM) 50 MG tablet Take 1 tablet (50 mg total) by mouth every 12 (twelve) hours as needed for moderate pain. 07/12/19   Marguerita MerlesSheikh, Omair Latif, DO  traZODone (DESYREL) 50 MG tablet Take 25 mg by mouth at bedtime.    [provider]    Family History Family History  Problem Relation Age of Onset  . Diabetes Mellitus II Neg Hx     Social History Social History   Tobacco Use  . Smoking status: Never Smoker  . Smokeless tobacco: Never Used  Substance Use Topics  . Alcohol use: No  . Drug use: No     Allergies   Patient has no known allergies.   Review of Systems Review of Systems  Unable to perform ROS: Acuity of condition     Physical Exam Updated Vital Signs BP 123/71   Pulse 93   Temp 99.8 F (37.7 C) (Rectal)   Resp 16   Ht 1.549 m (5\' 1" )   Wt 54.4 kg   SpO2 100%   BMI 22.67 kg/m   Physical Exam Vitals signs and nursing note reviewed.  Constitutional:      Appearance: He is well-developed.     Comments: Ill-appearing, at times moaning in pain  HENT:     Head:  Normocephalic and atraumatic.     Mouth/Throat:     Mouth: Mucous membranes are dry.     Comments: Dry cracked lips Eyes:     General:        Right eye: Discharge present.        Left eye: Discharge present.    Pupils: Pupils are equal, round, and reactive to light.  Neck:     Musculoskeletal: Neck supple.  Cardiovascular:     Rate and Rhythm: Normal rate and regular rhythm.  Heart sounds: Normal heart sounds. No murmur.  Pulmonary:     Effort: Respiratory distress present.     Breath sounds: Rales present. No wheezing.     Comments: Gurgling respirations, coarse Rales bilaterally, nonrebreather in place Abdominal:     General: Bowel sounds are normal.     Palpations: Abdomen is soft.     Tenderness: There is no abdominal tenderness. There is no rebound.  Genitourinary:    Comments: Sore noted over the scrotum Musculoskeletal:     Comments: Deformity to the left hip, stitches in place with hyperemia but no drainage from incisions  Skin:    General: Skin is warm and dry.  Neurological:     Comments: Unable to assess orientation, appears to move all 4 extremities  Psychiatric:     Comments: Unable to assess      ED Treatments / Results  Labs (all labs ordered are listed, but only abnormal results are displayed) Labs Reviewed  LACTIC ACID, PLASMA - Abnormal; Notable for the following components:      Result Value   Lactic Acid, Venous 4.5 (*)    All other components within normal limits  COMPREHENSIVE METABOLIC PANEL - Abnormal; Notable for the following components:   Potassium 3.3 (*)    Glucose, Bld 180 (*)    BUN 22 (*)    Creatinine, Ser 1.46 (*)    Calcium 7.9 (*)    Total Protein 5.7 (*)    Albumin 2.0 (*)    GFR calc non Af Amer 52 (*)    All other components within normal limits  CBC WITH DIFFERENTIAL/PLATELET - Abnormal; Notable for the following components:   WBC 17.8 (*)    RBC 4.14 (*)    MCV 104.8 (*)    RDW 16.8 (*)    Platelets 488 (*)    All  other components within normal limits  PROTIME-INR - Abnormal; Notable for the following components:   Prothrombin Time 16.7 (*)    INR 1.4 (*)    All other components within normal limits  CULTURE, BLOOD (ROUTINE X 2)  CULTURE, BLOOD (ROUTINE X 2)  URINE CULTURE  SARS CORONAVIRUS 2 BY RT PCR (HOSPITAL ORDER, PERFORMED IN [facility])  APTT  LIPASE, BLOOD  LACTIC ACID, PLASMA  URINALYSIS, ROUTINE W REFLEX MICROSCOPIC    EKG EKG Interpretation  Date/Time:  Thursday July 28 2019 02:05:20 EDT Ventricular Rate:  94 PR Interval:    QRS Duration: 107 QT Interval:  409 QTC Calculation: 512 R Axis:   -60 Text Interpretation:  Normal sinus rhythm Incomplete RBBB and LAFB Consider right ventricular hypertrophy Probable anteroseptal infarct, old Prolonged QT interval Confirmed by Ross Marcus ([id number]) on 07/28/2019 3:47:51 AM   Radiology Dg Chest Port 1 View  Result Date: 07/28/2019 CLINICAL DATA:  Hypoxia; ET and OG placement EXAM: PORTABLE CHEST 1 VIEW COMPARISON:  Radiograph 07/08/2019 FINDINGS: Endotracheal tube is positioned low within the trachea terminating 1.5 cm from the carina. Transesophageal tube tip and side port distal to the GE junction directed towards the gastric antrum. Widespread interstitial and airspace opacities present throughout the right hemithorax and minimally in the left lung base. No pneumothorax or visible effusion. Stable calcified granulomata in the hila. Aortic calcification is noted as well. Cardiomediastinal contours are otherwise unremarkable. No acute osseous or soft tissue abnormality. IMPRESSION: 1. Endotracheal tube is positioned low within the trachea terminating 1.5 cm from the carina. Recommend retraction 2-3 cm to the mid trachea. 2. Transesophageal  tube tip and side port distal to the GE junction. 3. Widespread interstitial and airspace opacities throughout the right hemithorax and minimally in the left lung base, could reflect  pneumonia or sequela extensive aspiration. These results were called by telephone at the time of interpretation on 07/28/2019 at 4:05 am to provider [facility] , who verbally acknowledged these results. Electronically Signed   By: Lovena Le M.D.   On: 07/28/2019 04:06    Procedures Procedure Name: Intubation Date/Time: 07/28/2019 4:19 AM Performed by: Merryl Hacker, MD Pre-anesthesia Checklist: Patient identified Oxygen Delivery Method: Non-rebreather mask Induction Type: IV induction Laryngoscope Size: Glidescope and 3 Grade View: Grade I Tube size: 7.5 mm Number of attempts: 1 Secured at: 25 cm Tube secured with: ETT holder      (including critical care time) CRITICAL CARE Performed by: Merryl Hacker   Total critical care time: 60 minutes  Critical care time was exclusive of separately billable procedures and treating other patients.  Critical care was necessary to treat or prevent imminent or life-threatening deterioration.  Critical care was time spent personally by me on the following activities: development of treatment plan with patient and/or surrogate as well as nursing, discussions with consultants, evaluation of patient's response to treatment, examination of patient, obtaining history from patient or surrogate, ordering and performing treatments and interventions, ordering and review of laboratory studies, ordering and review of radiographic studies, pulse oximetry and re-evaluation of patient's condition.  Medications Ordered in ED Medications  sodium chloride 0.9 % bolus 1,000 mL (0 mLs Intravenous Stopped 07/28/19 0401)    And  sodium chloride 0.9 % bolus 500 mL (500 mLs Intravenous Not Given 07/28/19 0402)    And  sodium chloride 0.9 % bolus 250 mL (250 mLs Intravenous Not Given 07/28/19 0402)  vancomycin (VANCOCIN) IVPB 1000 mg/200 mL premix (1,000 mg Intravenous New Bag/Given 07/28/19 0322)  glycopyrrolate (ROBINUL) injection 0.1 mg (has no  administration in time range)  LORazepam (ATIVAN) injection 0.5 mg (0.5 mg Intravenous Not Given 07/28/19 0402)  ceFEPIme (MAXIPIME) 2 g in sodium chloride 0.9 % 100 mL IVPB (0 g Intravenous Stopped 07/28/19 0403)  morphine 4 MG/ML injection 4 mg (2 mg Intravenous Given 07/28/19 0319)  etomidate (AMIDATE) injection (20 mg Intravenous Given 07/28/19 0340)  rocuronium (ZEMURON) injection (100 mg Intravenous Given 07/28/19 0343)     Initial Impression / Assessment and Plan / ED Course  I have reviewed the triage vital signs and the nursing notes.  Pertinent labs & imaging results that were available during my care of the patient were reviewed by me and considered in my medical decision making (see chart for details).        Patient presents with reported nausea, vomiting, diarrhea.  Found hypoxic by EMS with concerning respiratory status.  On initial evaluation temperature 100.1.  He has tachypnea with a rate of 33, hypotension 81/45, O2 sats 94% on a nonrebreather.  High level of suspicion for sepsis given his respiratory status and breath sounds.  Code sepsis was initiated.  He was given 30 cc/kg and given his recent hospitalization was given vancomycin and cefepime to cover for hospital-acquired pneumonia.  Lactate noted to be elevated at 4.5.  He has a leukocytosis.  Patient progressively agitated and I have concerns for his airway.  He is currently on a nonrebreather but with gurgly breath sounds and I suspect he may have aspirated.  For this reason and ability to obtain additional imaging, patient was intubated.  X-ray  of his hip on the left shows a refracture through the nail.  After intubation, vital signs stabilized.  Patient is fluid responsive with repeat blood pressure of 123/71.  I discussed the patient with the ICU.  They will assess the patient.  I have deferred orthopedics consult as he is acutely ill and septic.  They can consult Dr. [name] team as an inpatient.  Final Clinical  Impressions(s) / ED Diagnoses   Final diagnoses:  Septic shock (HCC)  Healthcare-associated pneumonia  Closed fracture of left hip, initial encounter [facility])    ED Discharge Orders    None       Shon Baton, MD 07/28/19 [phone]

## 2019-07-28 NOTE — H&P (Addendum)
NAME:  Brian Stanton, MRN:  [id number], DOB:  1960-12-06, LOS: 0 ADMISSION DATE:  07/28/2019, CONSULTATION DATE:  07/28/2019 REFERRING MD:  Dr. Dina Rich, CHIEF COMPLAINT:  Respiratory failure  Brief History   34 yoM with hx of Down Syndrome presenting from rehab/ SNF with N/V/D found to have sepsis nearing shock with aspiration pneumonia and requiring intubation for hypoxic respiratory failure.  Additionally has AKI and refracture of left hip nailing from recent hip surgery in September.   History of present illness   HPI obtained from medical chart review as patient is encephalopathic mechanical ventilation.  58 year old male with history of Down Syndrome, intellectual disability, hypothyroidism, hip fracture, dysphagia, and chronic left UPJ obstruction presenting from his SNF/ rehab for nausea, vomiting and diarrhea.    Recently hospitalized 9/22- 9/29 for bilateral hip fractures (left appearing more acute/ right more chronic) and underwent left Biomet now short trochanteric nail with interlocking and the right hip bipolar hemiarthroplasty done by Dr. Lorin Mercy on 9/23. Additionally treated for possible RLL pneumonia with ceftriaxone and azithromycin.  He was discharged to SNF/ rehab.    Found to be agitated with EMS and received haldol and noted to be hypotensive and hypoxic.  In ER, temp 100.1 with ongoing hypotension, respiratory distress and hypoxia despite NRB with gurgly airway sounds.  He was treated with 30 ml/kg fluids, cultures sent and started on empiric vancomycin and cefepime. Labs noted for lactate 4.5, WBC 17.8, K 3.3, sCr 1.46 (previously 0.84) with normal LFTs.  He had progressive agitation with concern for airway protection. One of his sisters was contacted who confirmed he was a full code and up until recent fracture, patient had been doing very well in his group home and at baseline speaks few words and pleasant.   Therefore he was intubated in ER for respiratory failure.  CXR post  intubation showing extensive right sided aspiration.  Xray of his hip showing refracture through the nail.  He has been fluid responsive thus far with resolution of hypotension.  PCCM called for admission.   Past Medical History  Down Syndrome, intellectual disability, hypothyroidism, hip fracture, dysphagia,  chronic left UPJ obstruction  Goes by "Brian Stanton"  [facility]   10/15 Admitted   Consults:  Will need ortho consult in am   Procedures:  10/15 ETT >>  Significant Diagnostic Tests:  10/15 XR b/l hip >> 1.  Comminuted periprosthetic fracture/re-injury of the left femoral neck and intertrochanteric region with large effusion and overlying swelling. Transcervical fixation hardware stands proud of the cortex. 2.  Normal location of the right hip hemiarthroplasty with recent postsurgical changes. Alignment is suboptimally evaluated on these nondedicated images and given patient rotation.   Micro Data:  10/15 SARS CoV2 >> neg 10/15 BC x 2 >> 10/15 UC >> 10/15 trach asp >>  Antimicrobials:  10/15 vanc >> 10/15 cefepime 10/15 zosyn >>  Interim history/subjective:   Objective   Blood pressure (!) 88/54, pulse 75, temperature 99.8 F (37.7 C), temperature source Rectal, resp. rate 16, height 5\' 1"  (1.549 m), weight 54.4 kg, SpO2 96 %.    Vent Mode: PRVC FiO2 (%):  [70 %] 70 % Set Rate:  [15 bmp] 15 bmp Vt Set:  [400 mL] 400 mL PEEP:  [5 cmH20] 5 cmH20 Plateau Pressure:  [18 cmH20] 18 cmH20   Intake/Output Summary (Last 24 hours) at 07/28/2019 0518 Last data filed at 07/28/2019 0401 Gross per 24 hour  Intake 2000 ml  Output -  Net 2000  ml   Filed Weights   07/28/19 0203  Weight: 54.4 kg    Examination: General:  Frail appearing thin older male HEENT: MM pink/dry, ETT at 25, OGT, pupils 3/reactive, bilateral greenish eye discharge Neuro: unresponsive CV: rr, no obvious murmur PULM:  Diffuse rhonchi, synchronous with MV GI: bs+, firmness in  suprapubic area suspect urinary retention Extremities: left hip deformity, warm/dry, trace LE edema, incisions to hip are healing with erythema, no warmth or discharge Skin: LE mottled, no rashes  Resolved Hospital Problem list    Assessment & Plan:   Acute hypoxic respiratory failure secondary to aspiration pneumonia P:  Full MV support, PRVC 8 cc/kg Continue PEEP/ FiO2 for sat goal > 92 CXR reviewed- ETT already retracted Pending ABG -May need to change to change to ARDS protocol  VAP bundle/ PPI duonebs q 4 prn    Sepsis nearing shock but remains fluid responsive thus far - presumed due to aspiration, but will need to rule out other/ additional sources P:  ICU monitoring On 3rd liter of IVF Goal MAP >65 Trend lactate Pan-culture, pending UA Continue vanc and zosyn, unable to add azithro for atypicals at this time given prolonged QTc No diarrhea present, but may need to send if ongoing   Comminuted periprosthetic fracture/re-injury of the left femoral neck and intertrochanteric region  - sister reports he recently fell out of bed at SNF early last week - on 9/23 underwent left Biomet now short trochanteric nail with interlocking for Comminuted left intertrochanteric femur fracture  P:  Consult Dr. [name] in am    Prolonged QTc P:  Monitor QTc/ tele Avoid QTc prolonging meds including antiemetics OGT to LWS for now Check mag, goal > 2   AKI Hypokalemia Lactic acidosis Hx of urinary retention - prior found to have chronic left UPJ obstruction without  - very distended bladder P:  Insert foley and send for UA and culture Add on mag/ phos KCL 40 meq per tube x 1 Recheck BMP at 1600, if sCr not improved will need to repeat renal US to rule out obstruction  Trend urinary output/ daily weights Replace electrolytes as indicated Avoid nephrotoxic agents, ensure adequate renal perfusion   Hx Down syndrome/ dementia/ intellect disability P:  Hold home Donepezil 5  mg po Daily, Trazodone 25 mg po qHS, and Memantine 28 mg po Daily   Hypothyroidism P:  Resume home levothyroxine 88 mcg per tube   Hyperglycemia P:  CBG q 4 with SSI Check A1c  Protein calorie malnutrition/ failure to thrive P:  Dietary consult  Hx vomiting/ diarrhea P:  LFTs wnl Monitor for diarrhea Avoid antimetics with prolonged QTc  Best practice:  Diet: NPO for now Pain/Anxiety/Delirium protocol (if indicated): prn fentanyl/ versed for RASS goal 0/-1 VAP protocol (if indicated): yes DVT prophylaxis: SQ heparin GI prophylaxis: PPI Glucose control: SSI sensitive/ q 4hr CBG Mobility: BR Code Status: full Family Communication: mother Lurlean Nanny is 67 and reportedly demented.  Sisters [name] [phone] or Darel Hong [phone] are primary contacts.  Able to get in touch with Darel Hong and updated.  Disposition: ICU  Labs   CBC: Recent Labs  Lab 07/28/19 0301  WBC 17.8*  NEUTROABS PENDING  HGB 13.3  HCT 43.4  MCV 104.8*  PLT 488*    Basic Metabolic Panel: Recent Labs  Lab 07/28/19 0301  NA 141  K 3.3*  CL 99  CO2 27  GLUCOSE 180*  BUN 22*  CREATININE 1.46*  CALCIUM 7.9*   GFR: Estimated  Creatinine Clearance: 40.3 mL/min (A) (by C-G formula based on SCr of 1.46 mg/dL (H)). Recent Labs  Lab 07/28/19 0301  WBC 17.8*  LATICACIDVEN 4.5*    Liver Function Tests: Recent Labs  Lab 07/28/19 0301  AST 31  ALT 14  ALKPHOS 98  BILITOT 0.6  PROT 5.7*  ALBUMIN 2.0*   Recent Labs  Lab 07/28/19 0301  LIPASE 46   No results for input(s): AMMONIA in the last 168 hours.  ABG    Component Value Date/Time   HCO3 26.5 07/06/2019 2125   TCO2 28 07/06/2019 2125   O2SAT 100.0 07/06/2019 2125     Coagulation Profile: Recent Labs  Lab 07/28/19 0301  INR 1.4*    Cardiac Enzymes: No results for input(s): CKTOTAL, CKMB, CKMBINDEX, TROPONINI in the last 168 hours.  HbA1C: No results found for: HGBA1C  CBG: No results for input(s): GLUCAP in the last  168 hours.  Review of Systems:   Unable  Past Medical History  He,  has a past medical history of Down's syndrome, GERD (gastroesophageal reflux disease), and Mental retardation.   Surgical History    Past Surgical History:  Procedure Laterality Date  . HIP ARTHROPLASTY Right 07/06/2019   Procedure: ARTHROPLASTY BIPOLAR HIP (HEMIARTHROPLASTY);  Surgeon: Eldred MangesYates, Mark C, MD;  Location: [facility];  Service: Orthopedics;  Laterality: Right;  . INTRAMEDULLARY (IM) NAIL INTERTROCHANTERIC Left 07/06/2019   Procedure: INTRAMEDULLARY (IM) NAIL INTERTROCHANTRIC;  Surgeon: Eldred MangesYates, Mark C, MD;  Location: [facility];  Service: Orthopedics;  Laterality: Left;     Social History   reports that he has never smoked. He has never used smokeless tobacco. He reports that he does not drink alcohol or use drugs.   Family History   His family history is negative for Diabetes Mellitus II.   Allergies No Known Allergies   Home Medications  Prior to Admission medications   Medication Sig Start Date End Date Taking? Authorizing Provider  acetaminophen (TYLENOL) 325 MG tablet Take 2 tablets (650 mg total) by mouth every 6 (six) hours as needed for mild pain. 07/12/19   Marguerita MerlesSheikh, Omair Latif, DO  aspirin EC 325 MG EC tablet Take 1 tablet (325 mg total) by mouth daily with breakfast. 07/13/19   Marguerita MerlesSheikh, Omair Latif, DO  cholecalciferol (VITAMIN D3) 25 MCG (1000 UT) tablet Take 1,000 Units by mouth daily.    [provider]  docusate sodium (COLACE) 100 MG capsule Take 100 mg by mouth 2 (two) times daily.    [provider]  donepezil (ARICEPT) 5 MG tablet Take 5 mg by mouth daily.    [provider]  guaiFENesin (MUCINEX) 600 MG 12 hr tablet Take 600 mg by mouth 2 (two) times daily.    [provider]  hydrocortisone (ANUSOL-HC) 25 MG suppository Place 25 mg rectally 3 (three) times daily as needed for hemorrhoids.     [provider]  Infant Care Products (BABY SHAMPOO EX) Apply  1 application topically daily. Use to wash eyes and face daily    [provider]  iron polysaccharides (NIFEREX) 150 MG capsule Take 1 capsule (150 mg total) by mouth daily. 07/12/19   Marguerita MerlesSheikh, Omair Latif, DO  levothyroxine (SYNTHROID) 88 MCG tablet Take 88 mcg by mouth daily before breakfast.    [provider]  lubiprostone (AMITIZA) 24 MCG capsule Take 24 mcg by mouth 2 (two) times a day.    [provider]  Melatonin 3 MG TABS Take 3 mg by mouth at  bedtime.    [provider]  memantine (NAMENDA XR) 28 MG CP24 24 hr capsule Take 28 mg by mouth daily.    [provider]  mirabegron ER (MYRBETRIQ) 25 MG TB24 tablet Take 25 mg by mouth daily. At 4 pm     [provider]  ondansetron (ZOFRAN) 4 MG tablet Take 1 tablet (4 mg total) by mouth every 6 (six) hours as needed for nausea. 07/12/19   Marguerita Merles Latif, DO  Probiotic Product (ALIGN PO) Take 1 capsule by mouth every evening.    [provider]  promethazine (PHENERGAN) 25 MG suppository Place 25 mg rectally every 4 (four) hours as needed for nausea or vomiting.    [provider]  Sunscreens (BLISTEX LIP BALM EX) Apply 1 application topically 3 (three) times daily.    [provider]  traMADol (ULTRAM) 50 MG tablet Take 1 tablet (50 mg total) by mouth every 12 (twelve) hours as needed for moderate pain. 07/12/19   Marguerita Merles Latif, DO  traZODone (DESYREL) 50 MG tablet Take 25 mg by mouth at bedtime.    [provider]     CRITICAL CARE Performed by: Posey Boyer     Total critical care time:  50 minutes   Critical care time was exclusive of separately billable procedures and treating other patients.   Critical care was necessary to treat or prevent imminent or life-threatening deterioration.   Critical care was time spent personally by me on the following activities: development of treatment plan with patient and/or surrogate as well as  nursing, discussions with consultants, evaluation of patient's response to treatment, examination of patient, obtaining history from patient or surrogate, ordering and performing treatments and interventions, ordering and review of laboratory studies, ordering and review of radiographic studies, pulse oximetry and re-evaluation of patient's condition. Chart reviewed and patient personally examined.  Patient seen at bedside and critical care plan was discussed with NP Simpson.  Agree with above assessment and plan.  Patient's prognosis is extremely poor and suspect that his ARDS will worsen over the next 24 hours.  Palliation would seem appropriate.   Dr Emelda Brothers MD Posey Boyer, MSN, AGACNP-BC [facility] Pgr: [phone] or if no answer [phone] 07/28/2019, 6:06 AM

## 2019-07-28 NOTE — Progress Notes (Signed)
Attempting to wean fio2 s/t discussion w/ RN and CCM. FIO2 weaned to 80%, sat now 100%

## 2019-07-28 NOTE — Progress Notes (Addendum)
NAME:  Brian Stanton, MRN:  [id number], DOB:  02/16/1961, LOS: 0 ADMISSION DATE:  07/28/2019, CONSULTATION DATE:  07/28/2019 REFERRING MD:  Dr. Wilkie Stanton, CHIEF COMPLAINT:  Respiratory failure  Brief History   58 yoM with hx of Down Syndrome presenting from rehab/ SNF with N/V/D found to have sepsis nearing shock with aspiration pneumonia and requiring intubation for hypoxic respiratory failure.  Additionally has AKI and refracture of left hip nailing from recent hip surgery in September.   History of present illness   HPI obtained from medical chart review as patient is encephalopathic mechanical ventilation.  58 year old male with history of Down Syndrome, intellectual disability, hypothyroidism, hip fracture, dysphagia, and chronic left UPJ obstruction presenting from his SNF/ rehab for nausea, vomiting and diarrhea.    Recently hospitalized 9/22- 9/29 for bilateral hip fractures (left appearing more acute/ right more chronic) and underwent left Biomet now short trochanteric nail with interlocking and the right hip bipolar hemiarthroplasty done by Dr. Ophelia Stanton on 9/23. Additionally treated for possible RLL pneumonia with ceftriaxone and azithromycin.  He was discharged to SNF/ rehab.    Found to be agitated with EMS and received haldol and noted to be hypotensive and hypoxic.  In ER, temp 100.1 with ongoing hypotension, respiratory distress and hypoxia despite NRB with gurgly airway sounds.  He was treated with 30 ml/kg fluids, cultures sent and started on empiric vancomycin and cefepime. Labs noted for lactate 4.5, WBC 17.8, K 3.3, sCr 1.46 (previously 0.84) with normal LFTs.  He had progressive agitation with concern for airway protection. One of his sisters was contacted who confirmed he was a full code and up until recent fracture, patient had been doing very well in his group home and at baseline speaks few words and pleasant.   Therefore he was intubated in ER for respiratory failure.  CXR post  intubation showing extensive right sided aspiration.  Xray of his hip showing refracture through the nail.  He has been fluid responsive thus far with resolution of hypotension.  PCCM called for admission.   Past Medical History  Down Syndrome, intellectual disability, hypothyroidism, hip fracture, dysphagia,  chronic left UPJ obstruction  Goes by "Brian Stanton"  Significant Hospital Events   10/15 Admitted   Consults:  Will need ortho consult in am   Procedures:  10/15 ETT >>  Significant Diagnostic Tests:  10/15 XR b/l hip >> 1.  Comminuted periprosthetic fracture/re-injury of the left femoral neck and intertrochanteric region with large effusion and overlying swelling. Transcervical fixation hardware stands proud of the cortex. 2.  Normal location of the right hip hemiarthroplasty with recent postsurgical changes. Alignment is suboptimally evaluated on these nondedicated images and given patient rotation.   Micro Data:  10/15 SARS CoV2 >> neg 10/15 BC x 2 >> 10/15 UC >> 10/15 trach asp >>  Antimicrobials:  10/15 vanc >> 10/15 cefepime>>  10/15 zosyn   Interim history/subjective:   Foley placed by urology with appropriate UOP and relief of bladder distention Resuming fluid resuscitation  FiO2 weaned to 80% from 100%  Objective   Blood pressure 116/74, pulse 83, temperature 98.3 F (36.8 C), temperature source Axillary, resp. rate (!) 39, height 5\' 1"  (1.549 m), weight 54.4 kg, SpO2 100 %.    Vent Mode: PRVC FiO2 (%):  [70 %-100 %] 80 % Set Rate:  [15 bmp-22 bmp] 22 bmp Vt Set:  [400 mL-430 mL] 430 mL PEEP:  [5 cmH20-8 cmH20] 8 cmH20 Plateau Pressure:  [9 cmH20-18 cmH20] 9 cmH20  Intake/Output Summary (Last 24 hours) at 07/28/2019 0823 Last data filed at 07/28/2019 0600 Gross per 24 hour  Intake 2187.5 ml  Output --  Net 2187.5 ml   Filed Weights   07/28/19 0203  Weight: 54.4 kg    Examination: General:  Frail appearing older adult male, intubated and  sedated  HEENT: ETT OGT secure. Trachea midline. Anicteric sclera. Crusted ocular discharge. Poor dentition  Neuro: Moving spontaneously. Does not follow commands  CV: RRR s1s2, cap refill < 3 seconds No JVD  PULM:  Bilateral rhonchi. Symmetrical chest expansion. No accessory muscle use  GI: soft, non-tender. +bowel sounds  Extremities: Symmetrical muscle tone. Hip incisions as below. Moving BUE spontaneously Skin: slightly mottled LE. Bilateral hip surgical incisions well approximated, without edema, drainage, bleeding but with some erythema and staples intact  Resolved Hospital Problem list    Assessment & Plan:  Goes By Brian Stanton"  Acute hypoxic respiratory failure requiring intubation -in setting of aspiration PNA -possible component of atelectasis in setting of recent ortho surgeries  -COVID-19 negative  P:  Full MV support, PRVC 8 cc/kg Continue PEEP/ FiO2 for sat goal > 92 VAP bundle PRN fentanyl and PRN versed for RASS goal duonebs q 4 prn   AM CXR Follow up tracheal aspirate; abx as below   Sepsis - presumed due to aspiration, but will need to rule out other/ additional sources P:  ICU hemodynamic monitoring Goal MAP >60 Overnight fluid resuscitation held until foley placed. Resume 3rd Liter IVF bolus now then maintenance IVF as ordered Will order for albumin  Follow up culture data  Vanc, cefepime  No current diarrhea, if diarrhea arises consider sending GI panel   Hypotension -Per conversation with Brian Stanton-- poor PO intake x approx 1 week with dehydration at [facility] -Component of sepsis Plan Continue volume resuscitation as above May need to initiate peripheral pressors while resuscitating  Bilateral hip fractures s/p repair 07/06/19 -R femoral neck fx s/p bipolar hemiarthroplasty with cemented stem -L comminuted intertrochanteric hip fx s/p biomet short trochanteric nail with interlock - sister reports he recently fell out of bedearly last  week -Discharged to SNF P:  Consult ortho (yates) this morning  Prolonged QTc P:  Monitor QTc/ tele Avoid QTc prolonging meds including antiemetics OGT to LWS for now Check mag, goal > 2  AKI Lactic acidosis Hx of urinary retention - prior found to have chronic left UPJ obstruction without  - very distended bladder on presentation P:  Add on mag/ phos KCL 40 meq per tube x 1 Trend urinary output/ daily weights Avoid nephrotoxic agents, ensure adequate renal perfusion Urology placed foley Follow up UCx   Down syndrome/ dementia/ intellect disability Significant anxiety with environmental change P:  Resume home Donepezil Trazodone  Home namenda is XR formula and cannot be given per tube -- pharmacy to assist in regular release per tube dosing  Delirium Precautions  Frequent re-orientation and facilitation of calm environment Non-pharmacologic interventions are preferred-- enjoys children's books; d/w nurse RE audible books   Hypothyroidism P:  Synthroid  per tube  Hyperglycemia P:  CBG q 4 with SSI Check A1c  Protein calorie malnutrition/ failure to thrive P:  RDN consult for EN   Hx vomiting/ diarrhea P:  Monitor for diarrhea Avoiding antimetics with prolonged QTc  Best practice:  Diet: EN per RDN  Pain/Anxiety/Delirium protocol (if indicated): prn fentanyl/ versed for RASS goal 0/-1 VAP protocol (if indicated): yes DVT prophylaxis: SQ heparin GI prophylaxis: PPI Glucose control:  SSI sensitive/ q 4hr CBG Mobility: BR Code Status: full Family Communication: mother Osker Mason is 15 and reportedly demented.  Sisters Vaughan Basta [phone] or Bethena Roys [phone] are primary contacts.  Bethena Roys updated by admitting team overnight and updates provided to [facility] this morning  Disposition: Remains in ICU   Labs   CBC: Recent Labs  Lab 07/28/19 0301 07/28/19 0605  WBC 17.8*  --   NEUTROABS 16.9*  --   HGB 13.3 13.9  HCT 43.4 41.0  MCV 104.8*  --   PLT 488*  --      Basic Metabolic Panel: Recent Labs  Lab 07/28/19 0301 07/28/19 0605 07/28/19 0638  NA 141 146*  --   K 3.3* 3.3*  --   CL 99  --   --   CO2 27  --   --   GLUCOSE 180*  --   --   BUN 22*  --   --   CREATININE 1.46*  --   --   CALCIUM 7.9*  --   --   MG  --   --  2.2  PHOS  --   --  3.6   GFR: Estimated Creatinine Clearance: 40.3 mL/min (A) (by C-G formula based on SCr of 1.46 mg/dL (H)). Recent Labs  Lab 07/28/19 0301 07/28/19 0638  WBC 17.8*  --   LATICACIDVEN 4.5* 5.9*    Liver Function Tests: Recent Labs  Lab 07/28/19 0301  AST 31  ALT 14  ALKPHOS 98  BILITOT 0.6  PROT 5.7*  ALBUMIN 2.0*   Recent Labs  Lab 07/28/19 0301  LIPASE 46   No results for input(s): AMMONIA in the last 168 hours.  ABG    Component Value Date/Time   PHART 7.276 (L) 07/28/2019 0605   PCO2ART 50.1 (H) 07/28/2019 0605   PO2ART 69.0 (L) 07/28/2019 0605   HCO3 23.5 07/28/2019 0605   TCO2 25 07/28/2019 0605   ACIDBASEDEF 4.0 (H) 07/28/2019 0605   O2SAT 91.0 07/28/2019 0605     Coagulation Profile: Recent Labs  Lab 07/28/19 0301  INR 1.4*    Cardiac Enzymes: No results for input(s): CKTOTAL, CKMB, CKMBINDEX, TROPONINI in the last 168 hours.  HbA1C: Hgb A1c MFr Bld  Date/Time Value Ref Range Status  07/28/2019 06:38 AM 4.7 (L) 4.8 - 5.6 % Final    Comment:    (NOTE) Pre diabetes:          5.7%-6.4% Diabetes:              >6.4% Glycemic control for   <7.0% adults with diabetes     CBG: Recent Labs  Lab 07/28/19 0545 07/28/19 0746  GLUCAP 143* 144*    Additional CRITICAL CARE Performed by: Cristal Generous   Total critical care time: 35 minutes  Critical care time was exclusive of separately billable procedures and treating other patients.  Critical care was necessary to treat or prevent imminent or life-threatening deterioration.  Critical care was time spent personally by me on the following activities: development of treatment plan with patient  and/or surrogate as well as nursing, discussions with consultants, evaluation of patient's response to treatment, examination of patient, obtaining history from patient or surrogate, ordering and performing treatments and interventions, ordering and review of laboratory studies, ordering and review of radiographic studies, pulse oximetry and re-evaluation of patient's condition.   Eliseo Gum MSN, AGACNP-BC [place] [phone] If no answer, [phone] 07/28/2019, 8:23 AM  Attending Note:  58 year old male with  down syndrome who presents with aspiration pneumonia and respiratory failure.  Intubated for airway protection.  Overnight, no acute events.  On exam, not following commands and dyssynchronous with the vent with coarse BS diffusely.  I reviewed CXR myself, infiltrate noted.  Discussed with PCCM-NP.  Will transition to PCV.  ABG as needed.  D/C vanc.  Minimize sedation as able.  Will need to discuss with family plan of care.  Continue support for now.  PCCM will continue to manage.  The patient is critically ill with multiple organ systems failure and requires high complexity decision making for assessment and support, frequent evaluation and titration of therapies, application of advanced monitoring technologies and extensive interpretation of multiple databases.   Critical Care Time devoted to patient care services described in this note is  35  Minutes. This time reflects time of care of this signee Dr Koren BoundWesam Kadance Mccuistion. This critical care time does not reflect procedure time, or teaching time or supervisory time of PA/NP/Med student/Med Resident etc but could involve care discussion time.  Alyson ReedyWesam G. Devaris Quirk, M.D. [facility]. Pager: [phone]. After hours pager: [phone].

## 2019-07-28 NOTE — Progress Notes (Signed)
Order was in for an arterial blood gas this morning.  According to RT, vent changes were made down in the ED at around 0500 am, so I waitied until 6 am to draw it.  Results as follows: Results for Brian Stanton, Brian Stanton (MRN [id number]) as of 07/28/2019 06:16  Ref. Range 07/28/2019 06:05  Sample type Unknown ARTERIAL  pH, Arterial Latest Ref Range: 7.350 - 7.450  7.276 (L)  pCO2 arterial Latest Ref Range: 32.0 - 48.0 mmHg 50.1 (H)  pO2, Arterial Latest Ref Range: 83.0 - 108.0 mmHg 69.0 (L)  TCO2 Latest Ref Range: 22 - 32 mmol/L 25  Acid-base deficit Latest Ref Range: 0.0 - 2.0 mmol/L 4.0 (H)  Bicarbonate Latest Ref Range: 20.0 - 28.0 mmol/L 23.5  O2 Saturation Latest Units: % 91.0  Patient temperature Unknown 97.7 F  Collection site Unknown RADIAL, ALLEN'S TEST ACCEPTABLE   Based on CO2, raised rate from 15 to 22, Dr. Lucile Shutters is aware of reading and change.  Left FIO2 at 100% due to low PaO2.  Previous RT stated that patient had an Nebulizer med to be done, Albuterol, when RN scanned it for me, there was no order for med, I double checked this and still did not see an order for it.  Will continue to monitor patient.

## 2019-07-28 NOTE — Progress Notes (Signed)
Staples removed 

## 2019-07-28 NOTE — ED Triage Notes (Addendum)
Pt coming from [facility]. Presents with nausea vomiting, has had frequent episodes of nausea and diarrhea. Pt potentially aspirated based on lung sounds. 99.7*f, 90/60 bp,150/100 after IV bolus (559ml), 110, 80ra, pt at 90%currently with 4Lo2.  5mg  haldol given per ems

## 2019-07-28 NOTE — Progress Notes (Signed)
Pharmacy Antibiotic Note  Brian Stanton is a 58 y.o. male admitted on 07/28/2019 with sepsis.  Pharmacy has been consulted for vancomycin and Zosyn dosing.  Plan: Rec'd vanc 1g IV in ED; given SCr 1.46 and baseline 0.84, will hold off on further dosing for now and monitor to redose. Rec'd cefepime 2g IV in ED; continue with Zosyn 3.375g IV Q8H (4-hour infusion).  Height: 5\' 1"  (154.9 cm) Weight: 120 lb (54.4 kg) IBW/kg (Calculated) : 52.3  Temp (24hrs), Avg:100 F (37.8 C), Min:99.8 F (37.7 C), Max:100.1 F (37.8 C)  Recent Labs  Lab 07/28/19 0301  WBC 17.8*  CREATININE 1.46*  LATICACIDVEN 4.5*    Estimated Creatinine Clearance: 40.3 mL/min (A) (by C-G formula based on SCr of 1.46 mg/dL (H)).    No Known Allergies   Thank you for allowing pharmacy to be a part of this patient's care.  Wynona Neat, PharmD, BCPS  07/28/2019 5:26 AM

## 2019-07-28 NOTE — Consult Note (Signed)
Reason for Consult:s/p bilateral hip fxs Referring Physician: W Antoin Stanton is an 59 y.o. male.  HPI: Brian Stanton is a patient well-known to orthopedics after undergoing right hip hemi and left IMN for bilateral hip fxs 3 weeks ago by Dr. Ophelia Stanton. He was at [facility] for rehab and developed N/V/D. He was brought to the ED and admitted for sepsis. X-rays of the hips showed a left periprosthetic fx and orthopedic surgery was consulted. He remains intubated and is unable to contribute to history. There is a report of him falling out of bed early last week.  Past Medical History:  Diagnosis Date  . Down's syndrome   . GERD (gastroesophageal reflux disease)   . Mental retardation     Past Surgical History:  Procedure Laterality Date  . HIP ARTHROPLASTY Right 07/06/2019   Procedure: ARTHROPLASTY BIPOLAR HIP (HEMIARTHROPLASTY);  Surgeon: Brian Manges, MD;  Location: [facility];  Service: Orthopedics;  Laterality: Right;  . INTRAMEDULLARY (IM) NAIL INTERTROCHANTERIC Left 07/06/2019   Procedure: INTRAMEDULLARY (IM) NAIL INTERTROCHANTRIC;  Surgeon: Brian Manges, MD;  Location: [facility];  Service: Orthopedics;  Laterality: Left;    Family History  Problem Relation Age of Onset  . Diabetes Mellitus II Neg Hx     Social History:  reports that he has never smoked. He has never used smokeless tobacco. He reports that he does not drink alcohol or use drugs.  Allergies: No Known Allergies  Medications: I have reviewed the patient's current medications.  Results for orders placed or performed during the hospital encounter of 07/28/19 (from the past 48 hour(s))  Lactic acid, plasma     Status: Abnormal   Collection Time: 07/28/19  3:01 AM  Result Value Ref Range   Lactic Acid, Venous 4.5 (HH) 0.5 - 1.9 mmol/L    Comment: CRITICAL RESULT CALLED TO, READ BACK BY AND VERIFIED WITH: [name],[name] 07/28/2019 0350 JORDANS Performed at [facility], [address], [place], [place] [address]   Comprehensive  metabolic panel     Status: Abnormal   Collection Time: 07/28/19  3:01 AM  Result Value Ref Range   Sodium 141 135 - 145 mmol/L   Potassium 3.3 (L) 3.5 - 5.1 mmol/L   Chloride 99 98 - 111 mmol/L   CO2 27 22 - 32 mmol/L   Glucose, Bld 180 (H) 70 - 99 mg/dL   BUN 22 (H) 6 - 20 mg/dL   Creatinine, Ser 6.64 (H) 0.61 - 1.24 mg/dL   Calcium 7.9 (L) 8.9 - 10.3 mg/dL   Total Protein 5.7 (L) 6.5 - 8.1 g/dL   Albumin 2.0 (L) 3.5 - 5.0 g/dL   AST 31 15 - 41 U/L   ALT 14 0 - 44 U/L   Alkaline Phosphatase 98 38 - 126 U/L   Total Bilirubin 0.6 0.3 - 1.2 mg/dL   GFR calc non Af Amer 52 (L) >60 mL/min   GFR calc Af Amer >60 >60 mL/min   Anion gap 15 5 - 15    Comment: Performed at [facility], [address], [place], [place] [address]  CBC WITH DIFFERENTIAL     Status: Abnormal   Collection Time: 07/28/19  3:01 AM  Result Value Ref Range   WBC 17.8 (H) 4.0 - 10.5 K/uL   RBC 4.14 (L) 4.22 - 5.81 MIL/uL   Hemoglobin 13.3 13.0 - 17.0 g/dL   HCT 42.5 95.6 - 38.7 %   MCV 104.8 (H) 80.0 - 100.0  fL   MCH 32.1 26.0 - 34.0 pg   MCHC 30.6 30.0 - 36.0 g/dL   RDW 40.916.8 (H) 81.111.5 - 91.415.5 %   Platelets 488 (H) 150 - 400 K/uL   nRBC 0.0 0.0 - 0.2 %   Neutrophils Relative % 94 %   Neutro Abs 16.9 (H) 1.7 - 7.7 K/uL   Lymphocytes Relative 2 %   Lymphs Abs 0.3 (L) 0.7 - 4.0 K/uL   Monocytes Relative 2 %   Monocytes Absolute 0.3 0.1 - 1.0 K/uL   Eosinophils Relative 0 %   Eosinophils Absolute 0.0 0.0 - 0.5 K/uL   Basophils Relative 1 %   Basophils Absolute 0.1 0.0 - 0.1 K/uL   Immature Granulocytes 1 %   Abs Immature Granulocytes 0.10 (H) 0.00 - 0.07 K/uL    Comment: Performed at [facility], [address] [place], [place] [address]  APTT     Status: None   Collection Time: 07/28/19  3:01 AM  Result Value Ref Range   aPTT 32 24 - 36 seconds    Comment: Performed at [facility], [address] [place], [place] [address]  Protime-INR     Status: Abnormal   Collection Time:  07/28/19  3:01 AM  Result Value Ref Range   Prothrombin Time 16.7 (H) 11.4 - 15.2 seconds   INR 1.4 (H) 0.8 - 1.2    Comment: (NOTE) INR goal varies based on device and disease states. Performed at [facility], [address] [place], [place] [address]   Lipase, blood     Status: None   Collection Time: 07/28/19  3:01 AM  Result Value Ref Range   Lipase 46 11 - 51 U/L    Comment: Performed at [facility], [address] [place], [place] [address]  SARS Coronavirus 2 by RT PCR (hospital order, performed in [facility] hospital lab) Nasopharyngeal Nasopharyngeal Swab     Status: None   Collection Time: 07/28/19  3:28 AM   Specimen: Nasopharyngeal Swab  Result Value Ref Range   SARS Coronavirus 2 NEGATIVE NEGATIVE    Comment: (NOTE) If result is NEGATIVE SARS-CoV-2 target nucleic acids are NOT DETECTED. The SARS-CoV-2 RNA is generally detectable in upper and lower  respiratory specimens during the acute phase of infection. The lowest  concentration of SARS-CoV-2 viral copies this assay can detect is 250  copies / mL. A negative result does not preclude SARS-CoV-2 infection  and should not be used as the sole basis for treatment or other  patient management decisions.  A negative result may occur with  improper specimen collection / handling, submission of specimen other  than nasopharyngeal swab, presence of viral mutation(s) within the  areas targeted by this assay, and inadequate number of viral copies  (<250 copies / mL). A negative result must be combined with clinical  observations, patient history, and epidemiological information. If result is POSITIVE SARS-CoV-2 target nucleic acids are DETECTED. The SARS-CoV-2 RNA is generally detectable in upper and lower  respiratory specimens dur ing the acute phase of infection.  Positive  results are indicative of active infection with SARS-CoV-2.  Clinical  correlation with patient history and other diagnostic  information is  necessary to determine patient infection status.  Positive results do  not rule out bacterial infection or co-infection with other viruses. If result is PRESUMPTIVE POSTIVE SARS-CoV-2 nucleic acids MAY BE PRESENT.   A presumptive positive result was obtained on the  submitted specimen  and confirmed on repeat testing.  While 2019 novel coronavirus  (SARS-CoV-2) nucleic acids may be present in the submitted sample  additional confirmatory testing may be necessary for epidemiological  and / or clinical management purposes  to differentiate between  SARS-CoV-2 and other Sarbecovirus currently known to infect humans.  If clinically indicated additional testing with an alternate test  methodology [phone]) is advised. The SARS-CoV-2 RNA is generally  detectable in upper and lower respiratory sp ecimens during the acute  phase of infection. The expected result is Negative. Fact Sheet for Patients:  BoilerBrush.com.cy Fact Sheet for Healthcare Providers: https://pope.com/ This test is not yet approved or cleared by the [place] FDA and has been authorized for detection and/or diagnosis of SARS-CoV-2 by FDA under an Emergency Use Authorization (EUA).  This EUA will remain in effect (meaning this test can be used) for the duration of the COVID-19 declaration under Section 564(b)(1) of the Act, 21 U.S.C. section 360bbb-3(b)(1), unless the authorization is terminated or revoked sooner. Performed at [facility], [address] [place], [place] [address]   Glucose, capillary     Status: Abnormal   Collection Time: 07/28/19  5:45 AM  Result Value Ref Range   Glucose-Capillary 143 (H) 70 - 99 mg/dL   Comment 1 Notify RN   I-STAT 7, (LYTES, BLD GAS, ICA, H+H)     Status: Abnormal   Collection Time: 07/28/19  6:05 AM  Result Value Ref Range   pH, Arterial 7.276 (L) 7.350 - 7.450   pCO2 arterial 50.1 (H) 32.0 - 48.0 mmHg    pO2, Arterial 69.0 (L) 83.0 - 108.0 mmHg   Bicarbonate 23.5 20.0 - 28.0 mmol/L   TCO2 25 22 - 32 mmol/L   O2 Saturation 91.0 %   Acid-base deficit 4.0 (H) 0.0 - 2.0 mmol/L   Sodium 146 (H) 135 - 145 mmol/L   Potassium 3.3 (L) 3.5 - 5.1 mmol/L   Calcium, Ion 1.10 (L) 1.15 - 1.40 mmol/L   HCT 41.0 39.0 - 52.0 %   Hemoglobin 13.9 13.0 - 17.0 g/dL   Patient temperature 81.1 F    Collection site RADIAL, ALLEN'S TEST ACCEPTABLE    Drawn by RT    Sample type ARTERIAL   Lactic acid, plasma     Status: Abnormal   Collection Time: 07/28/19  6:38 AM  Result Value Ref Range   Lactic Acid, Venous 5.9 (HH) 0.5 - 1.9 mmol/L    Comment: CRITICAL VALUE NOTED.  VALUE IS CONSISTENT WITH PREVIOUSLY REPORTED AND CALLED VALUE. Performed at [facility], [address] [place], [place] [address]   Hemoglobin A1c     Status: Abnormal   Collection Time: 07/28/19  6:38 AM  Result Value Ref Range   Hgb A1c MFr Bld 4.7 (L) 4.8 - 5.6 %    Comment: (NOTE) Pre diabetes:          5.7%-6.4% Diabetes:              >6.4% Glycemic control for   <7.0% adults with diabetes    Mean Plasma Glucose 88.19 mg/dL    Comment: Performed at [facility], [address], [place], [place] [address]  Magnesium     Status: None   Collection Time: 07/28/19  6:38 AM  Result Value Ref Range   Magnesium 2.2 1.7 - 2.4 mg/dL    Comment: Performed at [facility], [address], [place], [place] [address]  Phosphorus     Status: None   Collection Time: 07/28/19  6:38 AM  Result Value Ref Range   Phosphorus 3.6 2.5 - 4.6 mg/dL    Comment: Performed at [facility], [address], [place], [place] [address]  Glucose, capillary     Status: Abnormal   Collection Time: 07/28/19  7:46 AM  Result Value Ref Range   Glucose-Capillary 144 (H) 70 - 99 mg/dL    Dg Chest Port 1 View  Result Date: 07/28/2019 CLINICAL DATA:  Hypoxia; ET and OG placement EXAM: PORTABLE CHEST 1 VIEW COMPARISON:  Radiograph  07/08/2019 FINDINGS: Endotracheal tube is positioned low within the trachea terminating 1.5 cm from the carina. Transesophageal tube tip and side port distal to the GE junction directed towards the gastric antrum. Widespread interstitial and airspace opacities present throughout the right hemithorax and minimally in the left lung base. No pneumothorax or visible effusion. Stable calcified granulomata in the hila. Aortic calcification is noted as well. Cardiomediastinal contours are otherwise unremarkable. No acute osseous or soft tissue abnormality. IMPRESSION: 1. Endotracheal tube is positioned low within the trachea terminating 1.5 cm from the carina. Recommend retraction 2-3 cm to the mid trachea. 2. Transesophageal tube tip and side port distal to the GE junction. 3. Widespread interstitial and airspace opacities throughout the right hemithorax and minimally in the left lung base, could reflect pneumonia or sequela extensive aspiration. These results were called by telephone at the time of interpretation on 07/28/2019 at 4:05 am to provider [facility] , who verbally acknowledged these results. Electronically Signed   By: Kreg Shropshire M.D.   On: 07/28/2019 04:06   Dg Hip Unilat Brian Or Wo Pelvis 1 View Left  Result Date: 07/28/2019 CLINICAL DATA:  Left hip deformity EXAM: DG HIP (WITH OR WITHOUT PELVIS) 1V*L* COMPARISON:  Radiographs 07/06/2019 FINDINGS: There has been interval re-injury of the left femoral neck now with a highly comminuted periprosthetic fracture pattern extending through the left femoral neck and into the inter trochanteric region of the left femur. Transcervical fixation screw now stands proud of the bone cortex. The lower extremity is varus angulated with extensive overlying soft tissue swelling and a large effusion. Overlying skin staples are noted as well. There are additional postsurgical changes of the right hip post placement a right hip hemiarthroplasty for a transcervical  fracture seen comparison images. Articular component is normally located though alignment is incompletely assessed on non dedicated radiographs. IMPRESSION: Comminuted periprosthetic fracture/re-injury of the left femoral neck and intertrochanteric region with large effusion and overlying swelling. Transcervical fixation hardware stands proud of the cortex. Normal location of the right hip hemiarthroplasty with recent postsurgical changes. Alignment is suboptimally evaluated on these nondedicated images and given patient rotation. These results were called by telephone at the time of interpretation on 07/28/2019 at 4:11 am to provider [facility] , who verbally acknowledged these results. Electronically Signed   By: Kreg Shropshire M.D.   On: 07/28/2019 04:12    Review of Systems  Unable to perform ROS: Intubated   Blood pressure 116/74, pulse 83, temperature 98.3 F (36.8 C), temperature source Axillary, resp. rate (!) 39, height  (1.549 m), weight 54.4 kg, SpO2 98 %. Physical Exam  Constitutional: He appears well-developed and well-nourished. No distress.  HENT:  Head: Normocephalic and atraumatic.  Eyes: Conjunctivae are normal. Right eye exhibits no discharge. Left eye exhibits no discharge. No scleral icterus.  Neck: Normal range of motion.  Cardiovascular: Normal rate and  regular rhythm.  Respiratory: Effort normal. No respiratory distress.  Musculoskeletal:     Comments: RLE No traumatic wounds, ecchymosis, or rash  Incision C/D/I, staples in place, holds leg flexed and ext rotated, resistant to PROM  No knee or ankle effusion  Knee stable to varus/ valgus and anterior/posterior stress  Sens DPN, SPN, TN could not assess  Motor EHL, ext, flex, evers could not assess  DP 1+, PT 0, No significant edema  LLE No traumatic wounds, ecchymosis, or rash  Incision C/D/I, staples in place  No knee or ankle effusion  Knee stable to varus/ valgus and anterior/posterior stress  Sens DPN,  SPN, TN could not assess  Motor EHL, ext, flex, evers could not assess  DP 1+, PT 0, No significant edema  Neurological: He is alert.  Skin: Skin is warm and dry. He is not diaphoretic.  Psychiatric: He has a normal mood and affect. His behavior is normal.    Assessment/Plan: Left periprosthetic hip fx -- Staples may be removed. He will likely need to undergo proximal femur replacement. Will get further imaging. It is likely that he will need transfer to tertiary care for this fracture but will see if any community surgeons would be willing to take this on. NWB LLE. S/p right hip hemi -- Staples may be removed. Multiple medical problems including Down Syndrome, intellectual disability, hypothyroidism, hip fracture, dysphagia, chronic left UPJ obstruction, and septic shock -- per primary team    Lisette Abu, PA-C Orthopedic Surgery [phone] 07/28/2019, 9:48 AM

## 2019-07-28 NOTE — Progress Notes (Signed)
Notified bedside nurse of need to draw repeat lactic acid. 

## 2019-07-28 NOTE — Procedures (Addendum)
Central Venous Catheter Insertion Procedure Note Brian Stanton [id number] 1961-04-12  Procedure: Insertion of Central Venous Catheter Indications: Assessment of intravascular volume, Drug and/or fluid administration and Frequent blood sampling  Procedure Details Consent: Risks of procedure as well as the alternatives and risks of each were explained to the (patient/caregiver).  Consent for procedure obtained. Time Out: Verified patient identification, verified procedure, site/side was marked, verified correct patient position, special equipment/implants available, medications/allergies/relevent history reviewed, required imaging and test results available.  Performed  Maximum sterile technique was used including antiseptics, cap, gloves, gown, hand hygiene, mask and sheet. Skin prep: Chlorhexidine; local anesthetic administered A antimicrobial bonded/coated triple lumen catheter was placed in the left internal jugular vein using the Seldinger technique.  Evaluation Blood flow good Complications: No apparent complications Patient did tolerate procedure well. Chest X-ray ordered to verify placement.  CXR: pending.  I was at the bedside and observed placement of left IJ central line. Dr Laurelyn Sickle  Brian Stanton 07/28/2019, 11:53 PM

## 2019-07-28 NOTE — Progress Notes (Addendum)
[facility] Progress Note Patient Name: Colter Magowan DOB: 09/26/1961 MRN: [id number]   Date of Service  07/28/2019  HPI/Events of Note  Patient is hospitalized with aspiration pneumonia and septic shock on Norepinephrine. Blood pressure is low on high infusion rate of Norepinephrine. Recent serum albumin level is 2.0. Lactic acid 5.9.  eICU Interventions  Albumin 5 % 500 ml iv fluid bolus x 1 now. Lactated Ringers solution 500 ml iv fluid bolus now. Serum lactic acid at 11:45 PM.        Kerry Kass Kosisochukwu Goldberg 07/28/2019, 8:51 PM

## 2019-07-29 DIAGNOSIS — R1310 Dysphagia, unspecified: Secondary | ICD-10-CM

## 2019-07-29 DIAGNOSIS — B955 Unspecified streptococcus as the cause of diseases classified elsewhere: Secondary | ICD-10-CM

## 2019-07-29 DIAGNOSIS — J189 Pneumonia, unspecified organism: Secondary | ICD-10-CM

## 2019-07-29 DIAGNOSIS — J9600 Acute respiratory failure, unspecified whether with hypoxia or hypercapnia: Secondary | ICD-10-CM

## 2019-07-29 DIAGNOSIS — N179 Acute kidney failure, unspecified: Secondary | ICD-10-CM

## 2019-07-29 DIAGNOSIS — L89899 Pressure ulcer of other site, unspecified stage: Secondary | ICD-10-CM

## 2019-07-29 DIAGNOSIS — N509 Disorder of male genital organs, unspecified: Secondary | ICD-10-CM

## 2019-07-29 DIAGNOSIS — S72002A Fracture of unspecified part of neck of left femur, initial encounter for closed fracture: Secondary | ICD-10-CM

## 2019-07-29 DIAGNOSIS — Z9911 Dependence on respirator [ventilator] status: Secondary | ICD-10-CM

## 2019-07-29 DIAGNOSIS — Q909 Down syndrome, unspecified: Secondary | ICD-10-CM

## 2019-07-29 DIAGNOSIS — E039 Hypothyroidism, unspecified: Secondary | ICD-10-CM

## 2019-07-29 DIAGNOSIS — K0889 Other specified disorders of teeth and supporting structures: Secondary | ICD-10-CM

## 2019-07-29 DIAGNOSIS — R451 Restlessness and agitation: Secondary | ICD-10-CM

## 2019-07-29 DIAGNOSIS — N5089 Other specified disorders of the male genital organs: Secondary | ICD-10-CM

## 2019-07-29 DIAGNOSIS — R7881 Bacteremia: Secondary | ICD-10-CM

## 2019-07-29 DIAGNOSIS — F79 Unspecified intellectual disabilities: Secondary | ICD-10-CM

## 2019-07-29 DIAGNOSIS — B951 Streptococcus, group B, as the cause of diseases classified elsewhere: Secondary | ICD-10-CM

## 2019-07-29 DIAGNOSIS — M6258 Muscle wasting and atrophy, not elsewhere classified, other site: Secondary | ICD-10-CM

## 2019-07-29 DIAGNOSIS — B9561 Methicillin susceptible Staphylococcus aureus infection as the cause of diseases classified elsewhere: Secondary | ICD-10-CM

## 2019-07-29 DIAGNOSIS — Z8781 Personal history of (healed) traumatic fracture: Secondary | ICD-10-CM

## 2019-07-29 LAB — POCT I-STAT 7, (LYTES, BLD GAS, ICA,H+H)
Acid-base deficit: 1 mmol/L (ref 0.0–2.0)
Bicarbonate: 22.1 mmol/L (ref 20.0–28.0)
Calcium, Ion: 1.22 mmol/L (ref 1.15–1.40)
HCT: 26 % — ABNORMAL LOW (ref 39.0–52.0)
Hemoglobin: 8.8 g/dL — ABNORMAL LOW (ref 13.0–17.0)
O2 Saturation: 92 %
Patient temperature: 98.5
Potassium: 3.6 mmol/L (ref 3.5–5.1)
Sodium: 145 mmol/L (ref 135–145)
TCO2: 23 mmol/L (ref 22–32)
pCO2 arterial: 30.8 mmHg — ABNORMAL LOW (ref 32.0–48.0)
pH, Arterial: 7.464 — ABNORMAL HIGH (ref 7.350–7.450)
pO2, Arterial: 58 mmHg — ABNORMAL LOW (ref 83.0–108.0)

## 2019-07-29 LAB — COMPREHENSIVE METABOLIC PANEL
ALT: 9 U/L (ref 0–44)
AST: 19 U/L (ref 15–41)
Albumin: 2.2 g/dL — ABNORMAL LOW (ref 3.5–5.0)
Alkaline Phosphatase: 76 U/L (ref 38–126)
Anion gap: 4 — ABNORMAL LOW (ref 5–15)
BUN: 12 mg/dL (ref 6–20)
CO2: 24 mmol/L (ref 22–32)
Calcium: 7.7 mg/dL — ABNORMAL LOW (ref 8.9–10.3)
Chloride: 113 mmol/L — ABNORMAL HIGH (ref 98–111)
Creatinine, Ser: 0.9 mg/dL (ref 0.61–1.24)
GFR calc Af Amer: >60 mL/min (ref >60)
GFR calc non Af Amer: >60 mL/min (ref >60)
Glucose, Bld: 112 mg/dL — ABNORMAL HIGH (ref 70–99)
Potassium: 3.3 mmol/L — ABNORMAL LOW (ref 3.5–5.1)
Sodium: 141 mmol/L (ref 135–145)
Total Bilirubin: 1.1 mg/dL (ref 0.3–1.2)
Total Protein: 4.6 g/dL — ABNORMAL LOW (ref 6.5–8.1)

## 2019-07-29 LAB — GLUCOSE, CAPILLARY
Glucose-Capillary: 100 mg/dL — ABNORMAL HIGH (ref 70–99)
Glucose-Capillary: 108 mg/dL — ABNORMAL HIGH (ref 70–99)
Glucose-Capillary: 110 mg/dL — ABNORMAL HIGH (ref 70–99)
Glucose-Capillary: 121 mg/dL — ABNORMAL HIGH (ref 70–99)
Glucose-Capillary: 126 mg/dL — ABNORMAL HIGH (ref 70–99)
Glucose-Capillary: 130 mg/dL — ABNORMAL HIGH (ref 70–99)

## 2019-07-29 LAB — CORTISOL-AM, BLOOD: Cortisol - AM: 24.5 ug/dL — ABNORMAL HIGH (ref 6.7–22.6)

## 2019-07-29 LAB — MAGNESIUM: Magnesium: 1.8 mg/dL (ref 1.7–2.4)

## 2019-07-29 LAB — CBC
HCT: 29.1 % — ABNORMAL LOW (ref 39.0–52.0)
Hemoglobin: 9.2 g/dL — ABNORMAL LOW (ref 13.0–17.0)
MCH: 32.6 pg (ref 26.0–34.0)
MCHC: 31.6 g/dL (ref 30.0–36.0)
MCV: 103.2 fL — ABNORMAL HIGH (ref 80.0–100.0)
Platelets: 323 10*3/uL (ref 150–400)
RBC: 2.82 MIL/uL — ABNORMAL LOW (ref 4.22–5.81)
RDW: 16.6 % — ABNORMAL HIGH (ref 11.5–15.5)
WBC: 24.5 10*3/uL — ABNORMAL HIGH (ref 4.0–10.5)
nRBC: 0 % (ref 0.0–0.2)

## 2019-07-29 LAB — PHOSPHORUS: Phosphorus: 1.9 mg/dL — ABNORMAL LOW (ref 2.5–4.6)

## 2019-07-29 LAB — LACTIC ACID, PLASMA: Lactic Acid, Venous: 1.9 mmol/L (ref 0.5–1.9)

## 2019-07-29 MED ORDER — VANCOMYCIN HCL 10 G IV SOLR
1250.0000 mg | INTRAVENOUS | Status: DC
  Administered 2019-07-29 – 2019-07-30 (×2): 1250 mg via INTRAVENOUS
  Filled 2019-07-29 (×2): qty 1250

## 2019-07-29 MED ORDER — SODIUM CHLORIDE 0.9 % IV SOLN
2.0000 g | Freq: Three times a day (TID) | INTRAVENOUS | Status: DC
  Administered 2019-07-29: 2 g via INTRAVENOUS
  Filled 2019-07-29: qty 2

## 2019-07-29 MED ORDER — POTASSIUM CHLORIDE 10 MEQ/50ML IV SOLN
10.0000 meq | INTRAVENOUS | Status: AC
  Administered 2019-07-29 (×2): 10 meq via INTRAVENOUS
  Filled 2019-07-29 (×2): qty 50

## 2019-07-29 MED ORDER — POTASSIUM CHLORIDE 20 MEQ PO PACK
40.0000 meq | PACK | Freq: Two times a day (BID) | ORAL | Status: AC
  Administered 2019-07-29 (×2): 40 meq
  Filled 2019-07-29 (×2): qty 2

## 2019-07-29 MED ORDER — SODIUM CHLORIDE 0.9 % IV SOLN
3.0000 g | Freq: Four times a day (QID) | INTRAVENOUS | Status: DC
  Administered 2019-07-29 – 2019-07-30 (×4): 3 g via INTRAVENOUS
  Filled 2019-07-29 (×4): qty 8

## 2019-07-29 MED ORDER — SODIUM PHOSPHATES 45 MMOLE/15ML IV SOLN
20.0000 mmol | Freq: Once | INTRAVENOUS | Status: AC
  Administered 2019-07-29: 20 mmol via INTRAVENOUS
  Filled 2019-07-29: qty 6.67

## 2019-07-29 MED ORDER — PANTOPRAZOLE SODIUM 40 MG PO PACK
40.0000 mg | PACK | Freq: Every day | ORAL | Status: DC
  Administered 2019-07-29 – 2019-07-30 (×2): 40 mg
  Filled 2019-07-29 (×8): qty 20

## 2019-07-29 MED ORDER — NOREPINEPHRINE 4 MG/250ML-% IV SOLN
0.0000 ug/min | INTRAVENOUS | Status: DC
  Administered 2019-07-29: 16 ug/min via INTRAVENOUS
  Administered 2019-07-29: 11 ug/min via INTRAVENOUS
  Administered 2019-07-29: 10 ug/min via INTRAVENOUS
  Administered 2019-07-29 – 2019-07-30 (×3): 18 ug/min via INTRAVENOUS
  Filled 2019-07-29 (×6): qty 250

## 2019-07-29 NOTE — Progress Notes (Signed)
Pharmacy Antibiotic Note  Brian Stanton is a 58 y.o. male admitted on 07/28/2019 with sepsis. Pharmacy consulted to dose Cefepime and restart Vancomycin until cultures finalize.  WBC increased from 17.8 to 24.5 today, but lactate decreased to 1.9. Renal function improving with Scr down from 1.46 to 0.90 today (baseline ~0.8), making 2.5L UOP yesterday. CXR today continues to show interstitial and airspace opacities throughout lung, slightly increased in left lung.   Plan: Restart Vancomycin 1250 mg IV Q24 hrs (estimated AUC 505, Scr 0.9) Change Cefepime to 2 g IV Q8 hrs Follow-up ABX LOT Monitor renal function, cultures and sensitivities, and clinical progression  Height: 5\' 1"  (154.9 cm) Weight: 129 lb 3 oz (58.6 kg) IBW/kg (Calculated) : 52.3  Temp (24hrs), Avg:96.3 F (35.7 C), Min:82 F (27.8 C), Max:99.5 F (37.5 C)  Recent Labs  Lab 07/28/19 0301 07/28/19 0638 07/29/19 0235  WBC 17.8*  --  24.5*  CREATININE 1.46*  --  0.90  LATICACIDVEN 4.5* 5.9* 1.9    Estimated Creatinine Clearance: 65.4 mL/min (by C-G formula based on SCr of 0.9 mg/dL).    No Known Allergies  Antimicrobials this admission: Vancomycin 10/15 x1, 10/16 >> Cefepime 10/15 >> Zosyn never given  Dose adjustments this admission: None  Microbiology results: 10/15 Bcx: 1 of 2 staph aureus 10/15 Sputum: sent 10/15 MRSA PCR: neg  Richardine Service, PharmD PGY1 Pharmacy Resident Phone: [phone] 07/29/2019  10:27 AM  Please check [email or web address] for unit-specific pharmacy phone numbers.

## 2019-07-29 NOTE — Progress Notes (Signed)
Potassium 3.3 and Phos 1.9, Elink notified, awaiting further orders.

## 2019-07-29 NOTE — Progress Notes (Signed)
Pt's one designated visitor asked for exception to be made to allow her sister and her 39y old mother to visit.  Explained the visitation policy and why we are not making exceptions at this time.  She verbalized understanding.

## 2019-07-29 NOTE — Progress Notes (Signed)
NAME:  Brian Stanton, MRN:  [id number], DOB:  02-26-1961, LOS: 1 ADMISSION DATE:  07/28/2019, CONSULTATION DATE:  07/28/2019 REFERRING MD:  Brian Stanton, CHIEF COMPLAINT:  Respiratory failure  Brief History   58 yoM with hx of Down Syndrome presenting from rehab/ SNF with N/V/D found to have sepsis nearing shock with aspiration pneumonia and requiring intubation for hypoxic respiratory failure.  Additionally has AKI and refracture of left hip nailing from recent hip surgery in September.   History of present illness   HPI obtained from medical chart review as patient is encephalopathic mechanical ventilation.  58 year old male with history of Down Syndrome, intellectual disability, hypothyroidism, hip fracture, dysphagia, and chronic left UPJ obstruction presenting from his SNF/ rehab for nausea, vomiting and diarrhea.    Recently hospitalized 9/22- 9/29 for bilateral hip fractures (left appearing more acute/ right more chronic) and underwent left Biomet now short trochanteric nail with interlocking and the right hip bipolar hemiarthroplasty done by Dr. Lorin Stanton on 9/23. Additionally treated for possible RLL pneumonia with ceftriaxone and azithromycin.  He was discharged to SNF/ rehab.    Found to be agitated with EMS and received haldol and noted to be hypotensive and hypoxic.  In ER, temp 100.1 with ongoing hypotension, respiratory distress and hypoxia despite NRB with gurgly airway sounds.  He was treated with 30 ml/kg fluids, cultures sent and started on empiric vancomycin and cefepime. Labs noted for lactate 4.5, WBC 17.8, K 3.3, sCr 1.46 (previously 0.84) with normal LFTs.  He had progressive agitation with concern for airway protection. One of his sisters was contacted who confirmed he was a full code and up until recent fracture, patient had been doing very well in his group home and at baseline speaks few words and pleasant.   Therefore he was intubated in ER for respiratory failure.  CXR post  intubation showing extensive right sided aspiration.  Xray of his hip showing refracture through the nail.  He has been fluid responsive thus far with resolution of hypotension.  PCCM called for admission.   Past Medical History  Down Syndrome, intellectual disability, hypothyroidism, hip fracture, dysphagia,  chronic left UPJ obstruction  Goes by "Brian Stanton"  [facility] Events   10/15 Admitted   Consults:  Ortho  Procedures:  10/15 ETT >> 10/16 Lt IJ CVL >>  Significant Diagnostic Tests:  10/15 XR b/l hip >> 1.  Comminuted periprosthetic fracture/re-injury of the left femoral neck and intertrochanteric region with large effusion and overlying swelling. Transcervical fixation hardware stands proud of the cortex. 2.  Normal location of the right hip hemiarthroplasty with recent postsurgical changes. Alignment is suboptimally evaluated on these nondedicated images and given patient rotation.   Micro Data:  10/15 SARS CoV2 >> neg 10/15 BC x 2 >> GPC 10/15 UC >> 10/15 trach asp >>  Antimicrobials:  10/15 vanco >> 10/15 cefepime>>  10/15 zosyn   Interim history/subjective:   IJ placed yesterday.  Continues on Levophed drip at 8  Objective   Blood pressure 110/65, pulse 75, temperature 99.5 F (37.5 C), temperature source Axillary, resp. rate 20, height 5\' 1"  (1.549 m), weight 58.6 kg, SpO2 99 %.    Vent Mode: PCV FiO2 (%):  [40 %-60 %] 40 % Set Rate:  [10 bmp] 10 bmp PEEP:  [10 cmH20] 10 cmH20 Plateau Pressure:  [18 cmH20-19 cmH20] 19 cmH20   Intake/Output Summary (Last 24 hours) at 07/29/2019 0827 Last data filed at 07/29/2019 0600 Gross per 24 hour  Intake 5116.2 ml  Output 2555 ml  Net 2561.2 ml   Filed Weights   07/28/19 0203 07/29/19 0500  Weight: 54.4 kg 58.6 kg    Examination: Blood pressure 90/62, pulse 63, temperature 99.5 F (37.5 C), temperature source Axillary, resp. rate 19, height 5\' 1"  (1.549 m), weight 58.6 kg, SpO2 96 %. Gen:       Frail HEENT:  EOMI, sclera anicteric Neck:     No masses; no thyromegaly Lungs:    Clear to auscultation bilaterally; normal respiratory effort CV:         Regular rate and rhythm; no murmurs Abd:      + bowel sounds; soft, non-tender; no palpable masses, no distension Ext:   1+ edema; adequate peripheral perfusion Skin:      Warm and dry; no rash Neuro: Sedated   Resolved Hospital Problem list    Assessment & Plan:  Goes By Brian Stanton"  Acute hypoxic respiratory failure requiring intubation -in setting of aspiration PNA -possible component of atelectasis in setting of recent ortho surgeries  -COVID-19 negative  P:  Continue mechanical support Switch to PRVC due to dyssynchrony Wean PEEP/FiO2 Intermittent chest x-ray  Sepsis - presumed due to aspiration, but will need to rule out other/ additional sources 1/2 blood cultures with GPC P:  Continue Vanco, cefepime Follow final cultures Follow CVP.  Recorded at 10 today Keep even for today.  Bilateral hip fractures s/p repair 07/06/19 -R femoral neck fx s/p bipolar hemiarthroplasty with cemented stem -L comminuted intertrochanteric hip fx s/p biomet short trochanteric nail with interlock - sister reports he recently fell out of bedearly last week -Discharged to SNF P:  Ortho following.  Recommending transfer to tertiary care once he is better for management of complex hip fracture.  Prolonged QTc P:  Monitor QTc/ tele Avoid QTc prolonging meds including antiemetics Follow lites.  AKI Lactic acidosis.  Resolved Hx of urinary retention - prior found to have chronic left UPJ obstruction without  - very distended bladder on presentation P:  Monitor urine output and creatinine  Down syndrome/ dementia/ intellect disability Significant anxiety with environmental change P:  Resume home Donepezil Trazodone  Home namenda is XR formula and cannot be given per tube -- pharmacy to assist in regular release per tube dosing    Delirium Precautions  Frequent re-orientation and facilitation of calm environment Non-pharmacologic interventions are preferred-- enjoys children's books; d/w nurse RE audible books   Hypothyroidism P:  Synthroid 88mg  per tube  Hyperglycemia P:  CBG q 4 with SSI Check A1c  Protein calorie malnutrition/ failure to thrive P:  RDN consult for EN   Hx vomiting/ diarrhea P:  Monitor for diarrhea Avoiding antimetics with prolonged QTc  Best practice:  Diet: EN per RDN  Pain/Anxiety/Delirium protocol (if indicated): prn fentanyl/ versed for RASS goal 0/-1 VAP protocol (if indicated): yes DVT prophylaxis: SQ heparin GI prophylaxis: PPI Glucose control: SSI sensitive/ q 4hr CBG Mobility: BR Code Status: full Family Communication: mother Lurlean NannyWyonna is [phone] and reportedly demented.  Sisters Bonita QuinLinda [phone] or Darel HongJudy [phone] are primary contacts.  Darel HongJudy updated by admitting team overnight and updates provided to [facility] 10/16 Disposition: Remains in ICU   Labs   CBC: Recent Labs  Lab 07/28/19 0301 07/28/19 0605 07/29/19 0016 07/29/19 0235  WBC 17.8*  --   --  24.5*  NEUTROABS 16.9*  --   --   --   HGB 13.3 13.9 8.8* 9.2*  HCT 43.4 41.0 26.0* 29.1*  MCV 104.8*  --   --  103.2*  PLT 488*  --   --  323    Basic Metabolic Panel: Recent Labs  Lab 07/28/19 0301 07/28/19 0605 07/28/19 0638 07/29/19 0016 07/29/19 0235  NA 141 146*  --  145 141  K 3.3* 3.3*  --  3.6 3.3*  CL 99  --   --   --  113*  CO2 27  --   --   --  24  GLUCOSE 180*  --   --   --  112*  BUN 22*  --   --   --  12  CREATININE 1.46*  --   --   --  0.90  CALCIUM 7.9*  --   --   --  7.7*  MG  --   --  2.2  --  1.8  PHOS  --   --  3.6  --  1.9*   GFR: Estimated Creatinine Clearance: 65.4 mL/min (by C-G formula based on SCr of 0.9 mg/dL). Recent Labs  Lab 07/28/19 0301 07/28/19 0638 07/29/19 0235  WBC 17.8*  --  24.5*  LATICACIDVEN 4.5* 5.9* 1.9    Liver Function Tests: Recent Labs  Lab  07/28/19 0301 07/29/19 0235  AST 31 19  ALT 14 9  ALKPHOS 98 76  BILITOT 0.6 1.1  PROT 5.7* 4.6*  ALBUMIN 2.0* 2.2*   Recent Labs  Lab 07/28/19 0301  LIPASE 46   No results for input(s): AMMONIA in the last 168 hours.  ABG    Component Value Date/Time   PHART 7.464 (H) 07/29/2019 0016   PCO2ART 30.8 (L) 07/29/2019 0016   PO2ART 58.0 (L) 07/29/2019 0016   HCO3 22.1 07/29/2019 0016   TCO2 23 07/29/2019 0016   ACIDBASEDEF 1.0 07/29/2019 0016   O2SAT 92.0 07/29/2019 0016     Coagulation Profile: Recent Labs  Lab 07/28/19 0301  INR 1.4*    Cardiac Enzymes: No results for input(s): CKTOTAL, CKMB, CKMBINDEX, TROPONINI in the last 168 hours.  HbA1C: Hgb A1c MFr Bld  Date/Time Value Ref Range Status  07/28/2019 06:38 AM 4.7 (L) 4.8 - 5.6 % Final    Comment:    (NOTE) Pre diabetes:          5.7%-6.4% Diabetes:              >6.4% Glycemic control for   <7.0% adults with diabetes     CBG: Recent Labs  Lab 07/28/19 1112 07/28/19 1522 07/28/19 2015 07/29/19 0352 07/29/19 0742  GLUCAP 109* 111* 122* 108* 100*   The patient is critically ill with multiple organ system failure and requires high complexity decision making for assessment and support, frequent evaluation and titration of therapies, advanced monitoring, review of radiographic studies and interpretation of complex data.   Critical Care Time devoted to patient care services, exclusive of separately billable procedures, described in this note is 35 minutes.   Chilton Greathouse MD [facility] Pager [phone] If no answer call [phone] 07/29/2019, 8:53 AM

## 2019-07-29 NOTE — Progress Notes (Signed)
[facility] Progress Note Patient Name: Brian Stanton DOB: 1961/06/09 MRN: [id number]   Date of Service  07/29/2019  HPI/Events of Note  RN requests review of central line position prior to use.  eICU Interventions  Catheter appears to be in the proximal SVC. Use okayed. No  Pneumothorax seen.        Kerry Kass Pranavi Aure 07/29/2019, 12:28 AM

## 2019-07-29 NOTE — Progress Notes (Signed)
LR bolus and Albumin not effective. MD made aware, ground team placed CVC. Vasopressin ordered, levo max increased. Dr. Lucile Shutters reviewed chest x-ray and verified that it is okay to use.

## 2019-07-29 NOTE — Consult Note (Signed)
[facility] for Infectious Disease    Date of Admission:  07/28/2019          Reason for Consult: Staph aureus bacteremia    Referring Provider: Marshell Garfinkel, MD Primary Care Provider: unknown  Assessment: Brian Stanton is a 58 y.o. male with significant PMH of Down Syndrome, intellectual disability, bilateral hip fractures, chronic UPJ obstruction, hypothyroidism, and dysphagia who presented from SNF for nausea, vomiting, and diarrhea and found to be septic with hypoxic respiratory failure, hypotension, and AKI. He was intubated for respiratory failure and is currently in the ICU on pressure support.  He presented with a low grade fever of 100.1, though his T max is 99.5 over the past 24 hours. Current antibiotics are vanc and cefepime. Blood cultures from 10/15 growing Staph aureus and Strep agalactiae with susceptibilities pending. Will narrow to vanc and unasyn.   Source of infection uncertain at this point, though would suspect aspiration and pulmonary source with hypoxia and respiratory failure. Could also consider pressure wound from scrotum.   He will additionally need a prolonged course of IV therapy due to recent hip hardware and need for additional surgeries.   Plan: 1. Continue vancomycin 2. Discontinue cefepime 3. Start ampicillin-sulbactam  4. Will get TTE to investigate for signs of endocarditis 5. Will need repeat blood cultures to ensure clearance        [facility] Staphylococcus aureus bacteremia   Staphylococcus aureus bacteremia (SAB) is associated with a high rate of complications and mortality.  Specific aspects of clinical management are critical to optimizing the outcome of patients with SAB.  Therefore, the [facility] Antimicrobial Management Team [facility]) has initiated an intervention aimed at improving the management of SAB at [facility].  To do so, Infectious Diseases physicians are providing an evidence-based  consult for the management of all patients with SAB.     Yes No Comments  Perform follow-up blood cultures (even if the patient is afebrile) to ensure clearance of bacteremia '[x]'  '[]'    Remove vascular catheter and obtain follow-up blood cultures after the removal of the catheter '[x]'  '[]'    Perform echocardiography to evaluate for endocarditis (transthoracic ECHO is 40-50% sensitive, TEE is > 90% sensitive) '[x]'  '[]'  Please keep in mind, that neither test can definitively EXCLUDE endocarditis, and that should clinical suspicion remain high for endocarditis the patient should then still be treated with an "endocarditis" duration of therapy = 6 weeks  Consult electrophysiologist to evaluate implanted cardiac device (pacemaker, ICD) '[]'  '[x]'    Ensure source control '[x]'  '[]'  Have all abscesses been drained effectively? Have deep seeded infections (septic joints or osteomyelitis) had appropriate surgical debridement?  Investigate for "metastatic" sites of infection '[x]'  '[]'  Does the patient have ANY symptom or physical exam finding that would suggest a deeper infection (back or neck pain that may be suggestive of vertebral osteomyelitis or epidural abscess, muscle pain that could be a symptom of pyomyositis)?  Keep in mind that for deep seeded infections MRI imaging with contrast is preferred rather than other often insensitive tests such as plain x-rays, especially early in a patient's presentation.  Change antibiotic therapy to Vancomycin and unasyn '[x]'  '[]'  Beta-lactam antibiotics are preferred for MSSA due to higher cure rates.   If on Vancomycin, goal trough should be 15 - 20 mcg/mL  Estimated duration of IV antibiotic therapy:   Pending further work-up '[x]'  '[]'  Consult case management for probably prolonged outpatient IV antibiotic  therapy     Active Problems:   Acute respiratory failure (HCC)   Pressure injury of skin   Protein-calorie malnutrition, severe   . chlorhexidine gluconate (MEDLINE KIT)  15 mL  Mouth Rinse BID  . Chlorhexidine Gluconate Cloth  6 each Topical Q0600  . collagenase   Topical Daily  . donepezil  5 mg Per Tube Daily  . heparin  5,000 Units Subcutaneous Q8H  . insulin aspart  0-9 Units Subcutaneous Q4H  . levothyroxine  88 mcg Per Tube Q0600  . LORazepam  0.5 mg Intravenous Once  . mouth rinse  15 mL Mouth Rinse 10 times per day  . memantine  20 mg Per Tube Daily  . pantoprazole sodium  40 mg Per Tube Daily  . potassium chloride  40 mEq Per Tube BID  . traZODone  25 mg Per Tube QHS    HPI: Brian Stanton is a 58 y.o. male with significant PMH of Down Syndrome, intellectual disability, bilateral hip fractures, chronic UPJ obstruction, hypothyroidism, and dysphagia who presented from SNF for nausea, vomiting, and diarrhea. EMS found him agitated, hypotensive, and hypoxic. In the ER he was treated for sepsis with temp 100.1, hypotension, respiratory distress, and hypoxia. Pt was started on vanc/cefepime and given 2m/kg fluids. Labs remarkable for lactate of 4.5, WBC 17.8, K 3.3, and Cr 1.46 (with previously normal baseline). His hypotension was fluid responsive, but he required intubation in the ER for respiratory failure. Post-intubation CXR significant for widespread interstitial and airspace opacities throughout the R hemithorax and minimally in the L lung base, reflective of pneumonia or sequela of extensive aspiration.   Of note, he was recently hospitalized in the end of September for bilateral hip fractures. He underwent L Biomet now short trochanteric nail with interlocking and R hip hemiarthoplasty done by Dr. YLorin Mercyon 9/23. On presentation, bilateral hip x-rays in the ED remarkable for comminuted periprosthetic fracture/re-injury of the L femoral neck and intertrochanteric region with large effusion and overlying swelling, transcervical fixation hardware stands proud of the cortex, and normal location of the R hip hemiarthroplasty with recent post-surgical changes.  Pt's  blood cultures from 10/15 growing Staph aureus and Strep agalactiae. ID called for consultation.    Review of Systems: UNABLE TO PERFORM, PT INTUBATED AND SEDATED   Past Medical History:  Diagnosis Date  . Down's syndrome   . GERD (gastroesophageal reflux disease)   . Mental retardation     Social History   Tobacco Use  . Smoking status: Never Smoker  . Smokeless tobacco: Never Used  Substance Use Topics  . Alcohol use: No  . Drug use: No    Family History  Problem Relation Age of Onset  . Diabetes Mellitus II Neg Hx    No Known Allergies  OBJECTIVE: Blood pressure (!) 98/56, pulse (!) 55, temperature 98.6 F (37 C), temperature source Axillary, resp. rate 18, height '5\' 1"'  (1.549 m), weight 58.6 kg, SpO2 95 %.  Physical Exam Constitutional:      Appearance: He is ill-appearing (chronically).     Interventions: He is sedated, intubated and restrained.  HENT:     Mouth/Throat:     Comments: Very poor dentition  Cardiovascular:     Rate and Rhythm: Normal rate and regular rhythm.     Heart sounds: Normal heart sounds.  Pulmonary:     Effort: Pulmonary effort is normal. He is intubated.     Breath sounds: Normal breath sounds. No wheezing.  Abdominal:  General: Abdomen is flat. Bowel sounds are normal.     Palpations: Abdomen is soft.  Genitourinary:    Comments: Significant scrotal swelling with pressure wound on the posterior scrotum Musculoskeletal:     Right lower leg: No edema.     Left lower leg: No edema.  Skin:    General: Skin is warm and dry.  Neurological:     Comments: Sedated    Lab Results Lab Results  Component Value Date   WBC 24.5 (H) 07/29/2019   HGB 9.2 (L) 07/29/2019   HCT 29.1 (L) 07/29/2019   MCV 103.2 (H) 07/29/2019   PLT 323 07/29/2019    Lab Results  Component Value Date   CREATININE 0.90 07/29/2019   BUN 12 07/29/2019   NA 141 07/29/2019   K 3.3 (L) 07/29/2019   CL 113 (H) 07/29/2019   CO2 24 07/29/2019    Lab  Results  Component Value Date   ALT 9 07/29/2019   AST 19 07/29/2019   ALKPHOS 76 07/29/2019   BILITOT 1.1 07/29/2019     Microbiology: Recent Results (from the past 240 hour(s))  Blood Culture (routine x 2)     Status: Abnormal (Preliminary result)   Collection Time: 07/28/19  2:52 AM   Specimen: BLOOD  Result Value Ref Range Status   Specimen Description BLOOD LEFT ANTECUBITAL  Final   Special Requests   Final    BOTTLES DRAWN AEROBIC AND ANAEROBIC Blood Culture adequate volume   Culture  Setup Time   Final    IN BOTH AEROBIC AND ANAEROBIC BOTTLES GRAM POSITIVE COCCI CRITICAL RESULT CALLED TO, READ BACK BY AND VERIFIED WITH: Hughie Closs [facility] 07/28/19 1746 JDW    Culture (A)  Final    STAPHYLOCOCCUS AUREUS STREPTOCOCCUS AGALACTIAE SUSCEPTIBILITIES TO FOLLOW Performed at [facility], [place] [address], [place], [place] [address]    Report Status PENDING  Incomplete  Blood Culture (routine x 2)     Status: None (Preliminary result)   Collection Time: 07/28/19  3:05 AM   Specimen: BLOOD LEFT HAND  Result Value Ref Range Status   Specimen Description BLOOD LEFT HAND  Final   Special Requests   Final    BOTTLES DRAWN AEROBIC AND ANAEROBIC Blood Culture adequate volume   Culture   Final    NO GROWTH 1 DAY Performed at [facility], [place] [address] [place], [place] [address]    Report Status PENDING  Incomplete  SARS Coronavirus 2 by RT PCR (hospital order, performed in [facility] hospital lab) Nasopharyngeal Nasopharyngeal Swab     Status: None   Collection Time: 07/28/19  3:28 AM   Specimen: Nasopharyngeal Swab  Result Value Ref Range Status   SARS Coronavirus 2 NEGATIVE NEGATIVE Final    Comment: (NOTE) If result is NEGATIVE SARS-CoV-2 target nucleic acids are NOT DETECTED. The SARS-CoV-2 RNA is generally detectable in upper and lower  respiratory specimens during the acute phase of infection. The lowest  concentration of SARS-CoV-2 viral copies this assay can  detect is 250  copies / mL. A negative result does not preclude SARS-CoV-2 infection  and should not be used as the sole basis for treatment or other  patient management decisions.  A negative result may occur with  improper specimen collection / handling, submission of specimen other  than nasopharyngeal swab, presence of viral mutation(s) within the  areas targeted by this assay, and inadequate number of viral copies  (<250 copies / mL). A  negative result must be combined with clinical  observations, patient history, and epidemiological information. If result is POSITIVE SARS-CoV-2 target nucleic acids are DETECTED. The SARS-CoV-2 RNA is generally detectable in upper and lower  respiratory specimens dur ing the acute phase of infection.  Positive  results are indicative of active infection with SARS-CoV-2.  Clinical  correlation with patient history and other diagnostic information is  necessary to determine patient infection status.  Positive results do  not rule out bacterial infection or co-infection with other viruses. If result is PRESUMPTIVE POSTIVE SARS-CoV-2 nucleic acids MAY BE PRESENT.   A presumptive positive result was obtained on the submitted specimen  and confirmed on repeat testing.  While 2019 novel coronavirus  (SARS-CoV-2) nucleic acids may be present in the submitted sample  additional confirmatory testing may be necessary for epidemiological  and / or clinical management purposes  to differentiate between  SARS-CoV-2 and other Sarbecovirus currently known to infect humans.  If clinically indicated additional testing with an alternate test  methodology [phone]) is advised. The SARS-CoV-2 RNA is generally  detectable in upper and lower respiratory sp ecimens during the acute  phase of infection. The expected result is Negative. Fact Sheet for Patients:  StrictlyIdeas.no Fact Sheet for Healthcare Providers:  BankingDealers.co.za This test is not yet approved or cleared by the [place] FDA and has been authorized for detection and/or diagnosis of SARS-CoV-2 by FDA under an Emergency Use Authorization (EUA).  This EUA will remain in effect (meaning this test can be used) for the duration of the COVID-19 declaration under Section 564(b)(1) of the Act, 21 U.S.C. section 360bbb-3(b)(1), unless the authorization is terminated or revoked sooner. Performed at [facility], [place] [address] [place], [place] [address]   MRSA PCR Screening     Status: None   Collection Time: 07/28/19  7:25 AM   Specimen: Nasopharyngeal  Result Value Ref Range Status   MRSA by PCR NEGATIVE NEGATIVE Final    Comment:        The GeneXpert MRSA Assay (FDA approved for NASAL specimens only), is one component of a comprehensive MRSA colonization surveillance program. It is not intended to diagnose MRSA infection nor to guide or monitor treatment for MRSA infections. Performed at [facility], [place] [address] [place], [place] [address]     Ladona Horns, MD Internal Medicine Resident Intern Pager: [phone]  07/29/2019 3:34 PM

## 2019-07-29 NOTE — Progress Notes (Signed)
[facility] Progress Note Patient Name: Brian Stanton DOB: Jul 15, 1961 MRN: [id number]   Date of Service  07/29/2019  HPI/Events of Note  K+ 3.3, Phos 1.9  eICU Interventions  Elink electrolyte replacement protocol for K+ and Phos ordered.        [name] 07/29/2019, 5:56 AM

## 2019-07-29 NOTE — Progress Notes (Signed)
Pt moved from room 27m07 to 68m09 w/ no apparent complications.

## 2019-07-30 ENCOUNTER — Inpatient Hospital Stay (HOSPITAL_COMMUNITY): Payer: Medicare Other

## 2019-07-30 DIAGNOSIS — Z978 Presence of other specified devices: Secondary | ICD-10-CM

## 2019-07-30 DIAGNOSIS — S3130XA Unspecified open wound of scrotum and testes, initial encounter: Secondary | ICD-10-CM

## 2019-07-30 DIAGNOSIS — D72829 Elevated white blood cell count, unspecified: Secondary | ICD-10-CM

## 2019-07-30 DIAGNOSIS — I361 Nonrheumatic tricuspid (valve) insufficiency: Secondary | ICD-10-CM

## 2019-07-30 DIAGNOSIS — L089 Local infection of the skin and subcutaneous tissue, unspecified: Secondary | ICD-10-CM

## 2019-07-30 DIAGNOSIS — Z960 Presence of urogenital implants: Secondary | ICD-10-CM

## 2019-07-30 DIAGNOSIS — X58XXXA Exposure to other specified factors, initial encounter: Secondary | ICD-10-CM

## 2019-07-30 LAB — PHOSPHORUS: Phosphorus: 1.6 mg/dL — ABNORMAL LOW (ref 2.5–4.6)

## 2019-07-30 LAB — CBC
HCT: 30.1 % — ABNORMAL LOW (ref 39.0–52.0)
Hemoglobin: 9.3 g/dL — ABNORMAL LOW (ref 13.0–17.0)
MCH: 31.7 pg (ref 26.0–34.0)
MCHC: 30.9 g/dL (ref 30.0–36.0)
MCV: 102.7 fL — ABNORMAL HIGH (ref 80.0–100.0)
Platelets: 328 10*3/uL (ref 150–400)
RBC: 2.93 MIL/uL — ABNORMAL LOW (ref 4.22–5.81)
RDW: 17 % — ABNORMAL HIGH (ref 11.5–15.5)
WBC: 27.6 10*3/uL — ABNORMAL HIGH (ref 4.0–10.5)
nRBC: 0 % (ref 0.0–0.2)

## 2019-07-30 LAB — ECHOCARDIOGRAM COMPLETE
Height: 61 in
Weight: 2239.87 oz

## 2019-07-30 LAB — URINALYSIS, ROUTINE W REFLEX MICROSCOPIC
Bacteria, UA: NONE SEEN
Bilirubin Urine: NEGATIVE
Glucose, UA: NEGATIVE mg/dL
Ketones, ur: NEGATIVE mg/dL
Leukocytes,Ua: NEGATIVE
Nitrite: NEGATIVE
Protein, ur: NEGATIVE mg/dL
Specific Gravity, Urine: 1.014 (ref 1.005–1.030)
pH: 6 (ref 5.0–8.0)

## 2019-07-30 LAB — GLUCOSE, CAPILLARY
Glucose-Capillary: 115 mg/dL — ABNORMAL HIGH (ref 70–99)
Glucose-Capillary: 121 mg/dL — ABNORMAL HIGH (ref 70–99)
Glucose-Capillary: 123 mg/dL — ABNORMAL HIGH (ref 70–99)
Glucose-Capillary: 138 mg/dL — ABNORMAL HIGH (ref 70–99)
Glucose-Capillary: 151 mg/dL — ABNORMAL HIGH (ref 70–99)
Glucose-Capillary: 85 mg/dL (ref 70–99)

## 2019-07-30 LAB — CULTURE, BLOOD (ROUTINE X 2): Special Requests: ADEQUATE

## 2019-07-30 LAB — BASIC METABOLIC PANEL
Anion gap: 8 (ref 5–15)
BUN: 12 mg/dL (ref 6–20)
CO2: 21 mmol/L — ABNORMAL LOW (ref 22–32)
Calcium: 7.5 mg/dL — ABNORMAL LOW (ref 8.9–10.3)
Chloride: 114 mmol/L — ABNORMAL HIGH (ref 98–111)
Creatinine, Ser: 0.92 mg/dL (ref 0.61–1.24)
GFR calc Af Amer: >60 mL/min (ref >60)
GFR calc non Af Amer: >60 mL/min (ref >60)
Glucose, Bld: 138 mg/dL — ABNORMAL HIGH (ref 70–99)
Potassium: 3.5 mmol/L (ref 3.5–5.1)
Sodium: 143 mmol/L (ref 135–145)

## 2019-07-30 LAB — MAGNESIUM: Magnesium: 2.1 mg/dL (ref 1.7–2.4)

## 2019-07-30 MED ORDER — FENTANYL BOLUS VIA INFUSION
50.0000 ug | INTRAVENOUS | Status: DC | PRN
  Administered 2019-07-30 (×5): 50 ug via INTRAVENOUS
  Filled 2019-07-30: qty 50

## 2019-07-30 MED ORDER — METRONIDAZOLE IN NACL 5-0.79 MG/ML-% IV SOLN
500.0000 mg | Freq: Three times a day (TID) | INTRAVENOUS | Status: DC
  Administered 2019-07-30 – 2019-08-01 (×7): 500 mg via INTRAVENOUS
  Filled 2019-07-30 (×7): qty 100

## 2019-07-30 MED ORDER — FENTANYL CITRATE (PF) 100 MCG/2ML IJ SOLN: 50.0000 ug | Freq: Once | INTRAMUSCULAR | Status: DC

## 2019-07-30 MED ORDER — FENTANYL 2500MCG IN NS 250ML (10MCG/ML) PREMIX INFUSION
50.0000 ug/h | INTRAVENOUS | Status: DC
  Administered 2019-07-30: 50 ug/h via INTRAVENOUS
  Filled 2019-07-30: qty 250

## 2019-07-30 MED ORDER — FENTANYL 2500MCG IN NS 250ML (10MCG/ML) PREMIX INFUSION
0.0000 ug/h | INTRAVENOUS | Status: DC
  Administered 2019-07-30: 25 ug/h via INTRAVENOUS
  Filled 2019-07-30: qty 250

## 2019-07-30 MED ORDER — POTASSIUM PHOSPHATES 15 MMOLE/5ML IV SOLN
30.0000 mmol | Freq: Once | INTRAVENOUS | Status: AC
  Administered 2019-07-30: 30 mmol via INTRAVENOUS
  Filled 2019-07-30: qty 10

## 2019-07-30 MED ORDER — CEFAZOLIN SODIUM-DEXTROSE 2-4 GM/100ML-% IV SOLN
2.0000 g | Freq: Three times a day (TID) | INTRAVENOUS | Status: DC
  Administered 2019-07-30 – 2019-08-05 (×18): 2 g via INTRAVENOUS
  Filled 2019-07-30 (×21): qty 100

## 2019-07-30 MED ORDER — NOREPINEPHRINE 16 MG/250ML-% IV SOLN
0.0000 ug/min | INTRAVENOUS | Status: DC
  Administered 2019-07-30: 10 ug/min via INTRAVENOUS
  Filled 2019-07-30: qty 250

## 2019-07-30 MED ORDER — HALOPERIDOL LACTATE 5 MG/ML IJ SOLN
2.0000 mg | Freq: Four times a day (QID) | INTRAMUSCULAR | Status: DC | PRN
  Administered 2019-07-31: 2 mg via INTRAVENOUS
  Filled 2019-07-30 (×2): qty 1

## 2019-07-30 NOTE — Progress Notes (Signed)
Pt's ETT noted out 4 cm, RT placed back to previous 25 at the lip, follow up CXR ordered. Elink MD notified. VSS, no change in pt condition otherwise.

## 2019-07-30 NOTE — Progress Notes (Signed)
  Echocardiogram 2D Echocardiogram has been performed.  Brian Stanton 07/30/2019, 5:57 PM

## 2019-07-30 NOTE — Progress Notes (Signed)
[facility]   Reason for visit: Follow up on polymicrobial sepsis (MSSA & GBS)  Antibiotics: Vancomycin, day 4 Unasyn, day 2 + cefepime x 2 days  Interval History: Pt remains sedated on vent with slight increase in levophed gtt to 13 mcg/min as fentanyl added to precedex for perceived pain. Tube feeds at goal at 60 cc/hr. He remains at imminent risk for clinical decline and warrants further ICU monitoring. Blood cxs now confirmed as MSSA and GBS. Fever curve, WBC & Cr trends, imaging, cx results, and ABX usage all independently reviewed.   Current Facility-Administered Medications:  .  0.9 %  sodium chloride infusion, 250 mL, Intravenous, Continuous, Bowser, Laurel Dimmer, NP, Stopped at 07/29/19 0622 .  acetaminophen (TYLENOL) solution 650 mg, 650 mg, Per Tube, Q6H PRN, Cristal Generous, NP, 650 mg at 07/28/19 1700 .  Ampicillin-Sulbactam (UNASYN) 3 g in sodium chloride 0.9 % 100 mL IVPB, 3 g, Intravenous, Q6H, Ladona Horns, MD, Last Rate: 200 mL/hr at 07/30/19 1205, 3 g at 07/30/19 1205 .  artificial tears (LACRILUBE) ophthalmic ointment, , Both Eyes, Q4H PRN, Bowser, Laurel Dimmer, NP .  bisacodyl (DULCOLAX) suppository 10 mg, 10 mg, Rectal, Daily PRN, Jennelle Human B, NP .  chlorhexidine gluconate (MEDLINE KIT) (PERIDEX) 0.12 % solution 15 mL, 15 mL, Mouth Rinse, BID, Simpson, Paula B, NP, 15 mL at 07/30/19 0814 .  Chlorhexidine Gluconate Cloth 2 % PADS 6 each, 6 each, Topical, Q0600, Shellia Cleverly, MD, 6 each at 07/30/19 1042 .  collagenase (SANTYL) ointment, , Topical, Daily, Yacoub, Wesam G, MD .  dexmedetomidine (PRECEDEX) 400 MCG/100ML (4 mcg/mL) infusion, 0.1-0.4 mcg/kg/hr, Intravenous, Titrated, Bowser, Grace E, NP, Last Rate: 2.72 mL/hr at 07/30/19 1154, 0.2 mcg/kg/hr at 07/30/19 1154 .  docusate (COLACE) 50 MG/5ML liquid 100 mg, 100 mg, Per Tube, BID PRN, Jennelle Human B, NP .  donepezil (ARICEPT) tablet 5 mg, 5 mg, Per Tube, Daily, Bowser, Grace E, NP, 5  mg at 07/30/19 1044 .  feeding supplement (VITAL AF 1.2 CAL) liquid 1,000 mL, 1,000 mL, Per Tube, Continuous, Rush Farmer, MD, Stopped at 07/30/19 0600 .  fentaNYL (SUBLIMAZE) bolus via infusion 50 mcg, 50 mcg, Intravenous, Q15 min PRN, Mannam, Praveen, MD .  fentaNYL (SUBLIMAZE) injection 50 mcg, 50 mcg, Intravenous, Once, Mannam, Praveen, MD .  fentaNYL 2537mg in NS 2560m(1061mml) infusion-PREMIX, 50-200 mcg/hr, Intravenous, Continuous, Mannam, Praveen, MD, Last Rate: 5 mL/hr at 07/30/19 1202, 50 mcg/hr at 07/30/19 1202 .  heparin injection 5,000 Units, 5,000 Units, Subcutaneous, Q8H, SimJennelle Human NP, 5,000 Units at 07/30/19 0556 .  insulin aspart (novoLOG) injection 0-9 Units, 0-9 Units, Subcutaneous, Q4H, Simpson, Paula B, NP, 1 Units at 07/30/19 1153 .  ipratropium-albuterol (DUONEB) 0.5-2.5 (3) MG/3ML nebulizer solution 3 mL, 3 mL, Nebulization, Q4H PRN, SimJennelle Human NP .  levothyroxine (SYNTHROID) tablet 88 mcg, 88 mcg, Per Tube, Q0600, SimJennelle Human NP, 88 mcg at 07/30/19 0556 .  MEDLINE mouth rinse, 15 mL, Mouth Rinse, 10 times per day, SimJennelle Human NP, 15 mL at 07/30/19 1156 .  memantine (NAMENDA) tablet 20 mg, 20 mg, Per Tube, Daily, Bowser, Grace E, NP, 20 mg at 07/30/19 1044 .  midazolam (VERSED) injection 2 mg, 2 mg, Intravenous, Q2H PRN, SimJennelle Human NP, 2 mg at 07/29/19 2107 .  norepinephrine (LEVOPHED) 16 mg in 250m85memix infusion, 0-40 mcg/min, Intravenous, Titrated, Mannam, Praveen, MD, Last Rate: 13.13 mL/hr at 07/30/19 1050, 14 mcg/min  at 07/30/19 1050 .  oxybutynin (DITROPAN) tablet 5 mg, 5 mg, Oral, Q8H PRN, Franchot Gallo, MD .  pantoprazole sodium (PROTONIX) 40 mg/20 mL oral suspension 40 mg, 40 mg, Per Tube, Daily, Mannam, Praveen, MD, 40 mg at 07/30/19 1044 .  potassium PHOSPHATE 30 mmol in dextrose 5 % 500 mL infusion, 30 mmol, Intravenous, Once, Mannam, Praveen, MD, Last Rate: 85 mL/hr at 07/30/19 1041, 30 mmol at 07/30/19 1041 .   traZODone (DESYREL) tablet 25 mg, 25 mg, Per Tube, QHS, Bowser, Grace E, NP, 25 mg at 07/29/19 2104 .  vancomycin (VANCOCIN) 1,250 mg in sodium chloride 0.9 % 250 mL IVPB, 1,250 mg, Intravenous, Q24H, Ko, Christine, RPH, Last Rate: 166.7 mL/hr at 07/30/19 1023, 1,250 mg at 07/30/19 1023 .  vasopressin (PITRESSIN) 40 Units in sodium chloride 0.9 % 250 mL (0.16 Units/mL) infusion, 0.03 Units/min, Intravenous, Continuous, Ogan, Kerry Kass, MD, Stopped at 07/29/19 0145   Physical Exam:   Vitals:   07/30/19 0850 07/30/19 1112  BP:  (!) 97/55  Pulse: (!) 56 (!) 58  Resp: 16 16  Temp:  99.2 F (37.3 C)  SpO2: 96% 96%   Physical Exam Lines: LT IJ TLC, ET/OGT, Foley Gen: chronically ill-appearing, sedated on vent but attempts to maintain gaze Head: NCAT, no temporal wasting evident EENT: pupils pinpoint but reactive, +ET/OGT, MMM, adequate dentition Neck: supple, no JVD CV: bradycardic rate, RR, no murmurs evident Pulm: bibasilar crackles audible, rare retractions on the vent, vent settings: PCV mode, 40% FiO2/10 PS/10 PEEP Abd: soft, NTND, +BS GU: bandage intact to enlarged scrotum, +Foley Extrems: 1+ pitting LE edema, 2+ pulses, both legs froglegged in bed Skin: no rashes, adequate skin turgor Neuro: unable to fully assess secondary to sedation while on vent  Review of Systems:  Review of Systems  Unable to perform ROS: Intubated     Lab Results  Component Value Date   WBC 27.6 (H) 07/30/2019   HGB 9.3 (L) 07/30/2019   HCT 30.1 (L) 07/30/2019   MCV 102.7 (H) 07/30/2019   PLT 328 07/30/2019    Lab Results  Component Value Date   CREATININE 0.92 07/30/2019   BUN 12 07/30/2019   NA 143 07/30/2019   K 3.5 07/30/2019   CL 114 (H) 07/30/2019   CO2 21 (L) 07/30/2019    Lab Results  Component Value Date   ALT 9 07/29/2019   AST 19 07/29/2019   ALKPHOS 76 07/29/2019     Microbiology: Recent Results (from the past 240 hour(s))  Blood Culture (routine x 2)     Status:  Abnormal   Collection Time: 07/28/19  2:52 AM   Specimen: BLOOD  Result Value Ref Range Status   Specimen Description BLOOD LEFT ANTECUBITAL  Final   Special Requests   Final    BOTTLES DRAWN AEROBIC AND ANAEROBIC Blood Culture adequate volume   Culture  Setup Time   Final    IN BOTH AEROBIC AND ANAEROBIC BOTTLES GRAM POSITIVE COCCI CRITICAL RESULT CALLED TO, READ BACK BY AND VERIFIED WITH: Hughie Closs [facility] 07/28/19 1746 JDW Performed at [facility], [address] [place], [place] [address]    Culture STAPHYLOCOCCUS AUREUS STREPTOCOCCUS AGALACTIAE  (A)  Final   Report Status 07/30/2019 FINAL  Final   Organism ID, Bacteria STAPHYLOCOCCUS AUREUS  Final   Organism ID, Bacteria STREPTOCOCCUS AGALACTIAE  Final      Susceptibility   Streptococcus agalactiae - MIC*    CLINDAMYCIN >=1 RESISTANT Resistant  AMPICILLIN <=0.25 SENSITIVE Sensitive     ERYTHROMYCIN >=8 RESISTANT Resistant     VANCOMYCIN 0.5 SENSITIVE Sensitive     CEFTRIAXONE <=0.12 SENSITIVE Sensitive     LEVOFLOXACIN 1 SENSITIVE Sensitive     * STREPTOCOCCUS AGALACTIAE   Staphylococcus aureus - MIC*    CIPROFLOXACIN <=0.5 SENSITIVE Sensitive     ERYTHROMYCIN >=8 RESISTANT Resistant     GENTAMICIN <=0.5 SENSITIVE Sensitive     OXACILLIN 0.5 SENSITIVE Sensitive     TETRACYCLINE <=1 SENSITIVE Sensitive     VANCOMYCIN <=0.5 SENSITIVE Sensitive     TRIMETH/SULFA <=10 SENSITIVE Sensitive     CLINDAMYCIN RESISTANT Resistant     RIFAMPIN <=0.5 SENSITIVE Sensitive     Inducible Clindamycin POSITIVE Resistant     * STAPHYLOCOCCUS AUREUS  Blood Culture (routine x 2)     Status: None (Preliminary result)   Collection Time: 07/28/19  3:05 AM   Specimen: BLOOD LEFT HAND  Result Value Ref Range Status   Specimen Description BLOOD LEFT HAND  Final   Special Requests   Final    BOTTLES DRAWN AEROBIC AND ANAEROBIC Blood Culture adequate volume   Culture   Final    NO GROWTH 2 DAYS Performed at [facility],  [address], [place], [place] [address]    Report Status PENDING  Incomplete  SARS Coronavirus 2 by RT PCR (hospital order, performed in [facility] hospital lab) Nasopharyngeal Nasopharyngeal Swab     Status: None   Collection Time: 07/28/19  3:28 AM   Specimen: Nasopharyngeal Swab  Result Value Ref Range Status   SARS Coronavirus 2 NEGATIVE NEGATIVE Final    Comment: (NOTE) If result is NEGATIVE SARS-CoV-2 target nucleic acids are NOT DETECTED. The SARS-CoV-2 RNA is generally detectable in upper and lower  respiratory specimens during the acute phase of infection. The lowest  concentration of SARS-CoV-2 viral copies this assay can detect is 250  copies / mL. A negative result does not preclude SARS-CoV-2 infection  and should not be used as the sole basis for treatment or other  patient management decisions.  A negative result may occur with  improper specimen collection / handling, submission of specimen other  than nasopharyngeal swab, presence of viral mutation(s) within the  areas targeted by this assay, and inadequate number of viral copies  (<250 copies / mL). A negative result must be combined with clinical  observations, patient history, and epidemiological information. If result is POSITIVE SARS-CoV-2 target nucleic acids are DETECTED. The SARS-CoV-2 RNA is generally detectable in upper and lower  respiratory specimens dur ing the acute phase of infection.  Positive  results are indicative of active infection with SARS-CoV-2.  Clinical  correlation with patient history and other diagnostic information is  necessary to determine patient infection status.  Positive results do  not rule out bacterial infection or co-infection with other viruses. If result is PRESUMPTIVE POSTIVE SARS-CoV-2 nucleic acids MAY BE PRESENT.   A presumptive positive result was obtained on the submitted specimen  and confirmed on repeat testing.  While 2019 novel coronavirus  (SARS-CoV-2) nucleic  acids may be present in the submitted sample  additional confirmatory testing may be necessary for epidemiological  and / or clinical management purposes  to differentiate between  SARS-CoV-2 and other Sarbecovirus currently known to infect humans.  If clinically indicated additional testing with an alternate test  methodology [phone]) is advised. The SARS-CoV-2 RNA is generally  detectable in upper and lower respiratory sp ecimens  during the acute  phase of infection. The expected result is Negative. Fact Sheet for Patients:  StrictlyIdeas.no Fact Sheet for Healthcare Providers: BankingDealers.co.za This test is not yet approved or cleared by the [place] FDA and has been authorized for detection and/or diagnosis of SARS-CoV-2 by FDA under an Emergency Use Authorization (EUA).  This EUA will remain in effect (meaning this test can be used) for the duration of the COVID-19 declaration under Section 564(b)(1) of the Act, 21 U.S.C. section 360bbb-3(b)(1), unless the authorization is terminated or revoked sooner. Performed at [facility], [place] [address], [place], [place] [address]   MRSA PCR Screening     Status: None   Collection Time: 07/28/19  7:25 AM   Specimen: Nasopharyngeal  Result Value Ref Range Status   MRSA by PCR NEGATIVE NEGATIVE Final    Comment:        The GeneXpert MRSA Assay (FDA approved for NASAL specimens only), is one component of a comprehensive MRSA colonization surveillance program. It is not intended to diagnose MRSA infection nor to guide or monitor treatment for MRSA infections. Performed at [facility], [place] [address] [place], [place] [address]     Impression/Plan: The patient is a 58 y/o male SNF resident with Down's syndrome and recent bilateral hip fxs, s/p ORIF admitted with fever, leukocytosis, polymicrobial sepsis, scrotal wound, and probable aspiration PNA.  1. Polymicrobial  sepsis -both the patient's group B strep and MSSA likely have originated from his scrotal wound.  Repeat blood cultures thus far show no growth to date.  TTE has been ordered but nursing reports that the technicians have not performed the study thus far.  I have asked that she call echo department to ensure that the study will be done either later today or in the morning.  Given the patient's slight increase in pressors and hemodynamic instability, I will optimize the patient's antibiotics by changing his vancomycin to Unasyn to cefazolin 2 g every 8 hours and Flagyl 500 mg IV every 8 hours.  This should also cover concurrently for his possible aspiration pneumonia.  Duration of antibiotics is yet undetermined as it depends on his rapidity of clearance of his bacteremia and his echocardiogram results.  Given his prolonged pressor requirement, if his TTE is unrevealing, would proceed to a TEE once able.  2.  Aspiration pneumonia -the patient's body position does lend itself to this occurring even while he is on tube feeds via his OGT.  If his chest x-ray worsens or his vent settings increase, it may be of advantage to change his tube feeds to solution that is more concentrated and provides less volume.  Would ensure the patient's head of bed is always at 30 degrees or higher and that he is properly positioned to reduce his risk of further aspiration.  We will adjust antibiotics as noted above.  Per my review of the patient's ventilator settings, they are unchanged over the last 24 hours.  Unfortunately, his persistent need for pressure support and high PEEP has precluded attempts to wean from the ventilator for today.  3.  Leukocytosis -despite ongoing antibiotics, patient's white blood cell count has continued to rise, so we will adjust his vancomycin Unasyn to cefazolin and Flagyl.  Would continue to check CBC with differential daily to follow his white blood cell count trend.  Should further repeat blood  cultures remain positive, may need to consider removing his triple-lumen catheter as this was placed while he was  still actively bacteremic (exchange over a guidewire would be acceptable).  He also does have recent bilateral hip fractures, so we will pay close attention to his wound sites and would have a low threshold for imaging to ensure he does not have a postoperative abscess that is an appreciable via clinical exam.  33 minutes of critical care time spent

## 2019-07-30 NOTE — Significant Event (Signed)
  Interdisciplinary Goals of Care Family Meeting   Date carried out:: 07/30/2019  Location of the meeting: Phone conference  Member's involved: Nurse Practitioner and Vaughan Basta, patient's sister  Durable Power of Attorney or acting medical decision maker: Vaughan Basta, patient's sister who shares HPOA (with patient's mother who is reportedly demented at 59yo)  Discussion: We discussed goals of care for [employer]".  I updated Vaughan Basta on patient's condition, including his self extubation, high risk for re-intubation, aspiration pneumonia, ongoing septic shock, scrotal wounds likely contributing to his bacteremia, orthopedic issues still to be addressed and declining baseline functional status since hip fractures with baseline dementia.  Vaughan Basta is just surprised to have these discussions given how well her brother has been before his hip fractures and even at [facility] right after his surgeries.  We discussed that his current condition is likely a result of complications of being bedridden and re-injured hip fracture and Nicki Reaper will likely have a prolonged hospital course.  Vaughan Basta has been discussing long term GOC with her sister, Bethena Roys who is a retired Therapist, sports and has been advocating for her brother not to have aggressive measures.  Vaughan Basta believes it is still too early not to offer aggressive measures and would like to continue care, including reintubation if needed.  She will need to discuss with Bethena Roys and somehow involve their mother but they seem less likely to want long term measures including trach/ peg as they realize Nicki Reaper would not likely tolerate these given his dementia and Down Syndrome.  She is comfortable to decide to change to limited code and no CPR as this will not provide any benefit if his condition were to decompensate to this despite our ongoing aggressive measures.    Code status: limited, NO CPR, continue all other care for now.    Time spent for the meeting: 35 mins   Kennieth Rad, MSN,  AGACNP-BC [facility] Pgr: [phone] or if no answer [phone] 07/30/2019, 10:33 PM

## 2019-07-30 NOTE — Progress Notes (Addendum)
[facility] Progress Note Patient Name: Yordy Matton DOB: [birth year] MRN: [id number]   Date of Service  07/30/2019  HPI/Events of Note  Patient self-extubated. Sedation with a Fentanyl IV infusion now stopped. Sat on 100% NRBM = 100% and RR = 20. Dx on Down's Syndrome. Nursing does not feel he will do Incentive spirometry or tolerate NT suctioning.   eICU Interventions  Plan: 1. Trial of extubation. Follow closely for need for reintubation.  2. Will notify ground team to keep an eye on him.      Intervention Category Major Interventions: Hypoxemia - evaluation and management  Sommer,Steven Eugene 07/30/2019, 9:09 PM

## 2019-07-30 NOTE — Progress Notes (Addendum)
NAME:  Brian Stanton, MRN:  [id number], DOB:  1961-04-01, LOS: 2 ADMISSION DATE:  07/28/2019, CONSULTATION DATE:  07/28/2019 REFERRING MD:  Dr. Wilkie Aye, CHIEF COMPLAINT:  Respiratory failure  Brief History   66 yoM with hx of Down Syndrome presenting from rehab/ SNF with N/V/D found to have sepsis nearing shock with aspiration pneumonia and requiring intubation for hypoxic respiratory failure.  Additionally has AKI and refracture of left hip nailing from recent hip surgery in September.   History of present illness   HPI obtained from medical chart review as patient is encephalopathic mechanical ventilation.  58 year old male with history of Down Syndrome, intellectual disability, hypothyroidism, hip fracture, dysphagia, and chronic left UPJ obstruction presenting from his SNF/ rehab for nausea, vomiting and diarrhea.    Recently hospitalized 9/22- 9/29 for bilateral hip fractures (left appearing more acute/ right more chronic) and underwent left Biomet now short trochanteric nail with interlocking and the right hip bipolar hemiarthroplasty done by Dr. [name] on 9/23. Additionally treated for possible RLL pneumonia with ceftriaxone and azithromycin.  He was discharged to SNF/ rehab.    Found to be agitated with EMS and received haldol and noted to be hypotensive and hypoxic.  In ER, temp 100.1 with ongoing hypotension, respiratory distress and hypoxia despite NRB with gurgly airway sounds.  He was treated with 30 ml/kg fluids, cultures sent and started on empiric vancomycin and cefepime. Labs noted for lactate 4.5, WBC 17.8, K 3.3, sCr 1.46 (previously 0.84) with normal LFTs.  He had progressive agitation with concern for airway protection. One of his sisters was contacted who confirmed he was a full code and up until recent fracture, patient had been doing very well in his group home and at baseline speaks few words and pleasant.   Therefore he was intubated in ER for respiratory failure.  CXR post  intubation showing extensive right sided aspiration.  Xray of his hip showing refracture through the nail.  He has been fluid responsive thus far with resolution of hypotension.  PCCM called for admission.   Past Medical History  Down Syndrome, intellectual disability, hypothyroidism, hip fracture, dysphagia,  chronic left UPJ obstruction  Goes by "Brian Stanton"  Significant Hospital Events   10/15 Admitted   Consults:  Ortho  Procedures:  10/15 ETT >> 10/16 Lt IJ CVL >>  Significant Diagnostic Tests:  10/15 XR b/l hip >> 1.  Comminuted periprosthetic fracture/re-injury of the left femoral neck and intertrochanteric region with large effusion and overlying swelling. Transcervical fixation hardware stands proud of the cortex. 2.  Normal location of the right hip hemiarthroplasty with recent postsurgical changes. Alignment is suboptimally evaluated on these nondedicated images and given patient rotation.   Micro Data:  10/15 SARS CoV2 >> neg 10/15 BC x 2 >> Staph Aureus, Streptococcus agalactiae 10/15 UC >> 10/15 trach asp >>  Antimicrobials:  10/15 Vanco >> 10/15 Cefepime>>  10/15 zosyn 10/16 10/16 Unasyn >>  Interim history/subjective:  Continues on Levophed drip No acute events overnight  Objective   Blood pressure (!) 112/59, pulse (!) 56, temperature 99.2 F (37.3 C), temperature source Axillary, resp. rate 16, height 5\' 1"  (1.549 m), weight 63.5 kg, SpO2 96 %. CVP:  [7 mmHg] 7 mmHg  Vent Mode: PCV FiO2 (%):  [40 %] 40 % Set Rate:  [10 bmp] 10 bmp PEEP:  [10 cmH20] 10 cmH20 Plateau Pressure:  [18 cmH20-21 cmH20] 19 cmH20   Intake/Output Summary (Last 24 hours) at 07/30/2019 0923 Last data filed at 07/30/2019 0800  Gross per 24 hour  Intake 3527.41 ml  Output 1900 ml  Net 1627.41 ml   Filed Weights   07/28/19 0203 07/29/19 0500 07/30/19 0430  Weight: 54.4 kg 58.6 kg 63.5 kg   Examination: Gen:      No acute distress HEENT:  EOMI, sclera anicteric, ETT Neck:      No masses; no thyromegaly Lungs:    Clear to auscultation bilaterally; normal respiratory effort CV:         Regular rate and rhythm; no murmurs Abd:      + bowel sounds; soft, non-tender; no palpable masses, no distension Ext:    No edema; adequate peripheral perfusion Skin:      Warm and dry; no rash Neuro: [facility] Problem list    Assessment & Plan:  Goes By Brian Stanton"  Acute hypoxic respiratory failure requiring intubation -in setting of aspiration PNA -possible component of atelectasis in setting of recent ortho surgeries  -COVID-19 negative  P:  Continue mechanical support Switched to [facility] due to dyssynchrony Wean PEEP/FiO2 Intermittent chest x-ray  Sepsis, staph, strep bacteremia P:  Continue Vanco.  Cefepime changed to Unasyn TTE ordered.  Repeat blood cultures x2 today ID on board  Bilateral hip fractures s/p repair 07/06/19 -R femoral neck fx s/p bipolar hemiarthroplasty with cemented stem -L comminuted intertrochanteric hip fx s/p biomet short trochanteric nail with interlock - sister reports he recently fell out of bedearly last week -Discharged to SNF P:  Ortho following.  Recommending transfer to tertiary care once he is better for management of complex hip fracture.  Prolonged QTc P:  Monitor QTc/ tele Avoid QTc prolonging meds including antiemetics  AKI Lactic acidosis.  Resolved Hx of urinary retention - prior found to have chronic left UPJ obstruction without  - very distended bladder on presentation P:  Monitor urine output and creatinine  Down syndrome/ dementia/ intellect disability Significant anxiety with environmental change P:  Resumed home Donepezil Trazodone  Home namenda is XR formula and cannot be given per tube -- pharmacy to assist in regular release per tube dosing  Delirium Precautions  Frequent re-orientation and facilitation of calm environment Non-pharmacologic interventions are preferred-- enjoys children's  books; d/w nurse RE audible books   Hypothyroidism P:  Synthroid 88mg  per tube  Hyperglycemia P:  CBG q 4 with SSI Check A1c  Protein calorie malnutrition/ failure to thrive P:  RDN consult for EN   Hx vomiting/ diarrhea P:  Monitor for diarrhea Avoiding antimetics with prolonged QTc  Best practice:  Diet: EN per RDN  Pain/Anxiety/Delirium protocol (if indicated): prn fentanyl/ versed for RASS goal 0/-1 VAP protocol (if indicated): yes DVT prophylaxis: SQ heparin GI prophylaxis: PPI Glucose control: SSI sensitive/ q 4hr CBG Mobility: BR Code Status: full Family Communication: mother Brian Stanton is 8 and reportedly demented.  Sisters Vaughan Basta [phone] or Bethena Roys [phone] are primary contacts.  Bethena Roys updated by admitting team overnight and updates provided to [facility] 10/16 Disposition: Remains in ICU   Labs   CBC: Recent Labs  Lab 07/28/19 0301 07/28/19 0605 07/29/19 0016 07/29/19 0235 07/30/19 0434  WBC 17.8*  --   --  24.5* 27.6*  NEUTROABS 16.9*  --   --   --   --   HGB 13.3 13.9 8.8* 9.2* 9.3*  HCT 43.4 41.0 26.0* 29.1* 30.1*  MCV 104.8*  --   --  103.2* 102.7*  PLT 488*  --   --  323 709    Basic Metabolic Panel:  Recent Labs  Lab 07/28/19 0301 07/28/19 0605 07/28/19 0638 07/29/19 0016 07/29/19 0235 07/30/19 0434  NA 141 146*  --  145 141 143  K 3.3* 3.3*  --  3.6 3.3* 3.5  CL 99  --   --   --  113* 114*  CO2 27  --   --   --  24 21*  GLUCOSE 180*  --   --   --  112* 138*  BUN 22*  --   --   --  12 12  CREATININE 1.46*  --   --   --  0.90 0.92  CALCIUM 7.9*  --   --   --  7.7* 7.5*  MG  --   --  2.2  --  1.8 2.1  PHOS  --   --  3.6  --  1.9* 1.6*   GFR: Estimated Creatinine Clearance: 69.5 mL/min (by C-G formula based on SCr of 0.92 mg/dL). Recent Labs  Lab 07/28/19 0301 07/28/19 0638 07/29/19 0235 07/30/19 0434  WBC 17.8*  --  24.5* 27.6*  LATICACIDVEN 4.5* 5.9* 1.9  --     Liver Function Tests: Recent Labs  Lab 07/28/19 0301  07/29/19 0235  AST 31 19  ALT 14 9  ALKPHOS 98 76  BILITOT 0.6 1.1  PROT 5.7* 4.6*  ALBUMIN 2.0* 2.2*   Recent Labs  Lab 07/28/19 0301  LIPASE 46   No results for input(s): AMMONIA in the last 168 hours.  ABG    Component Value Date/Time   PHART 7.464 (H) 07/29/2019 0016   PCO2ART 30.8 (L) 07/29/2019 0016   PO2ART 58.0 (L) 07/29/2019 0016   HCO3 22.1 07/29/2019 0016   TCO2 23 07/29/2019 0016   ACIDBASEDEF 1.0 07/29/2019 0016   O2SAT 92.0 07/29/2019 0016     Coagulation Profile: Recent Labs  Lab 07/28/19 0301  INR 1.4*    Cardiac Enzymes: No results for input(s): CKTOTAL, CKMB, CKMBINDEX, TROPONINI in the last 168 hours.  HbA1C: Hgb A1c MFr Bld  Date/Time Value Ref Range Status  07/28/2019 06:38 AM 4.7 (L) 4.8 - 5.6 % Final    Comment:    (NOTE) Pre diabetes:          5.7%-6.4% Diabetes:              >6.4% Glycemic control for   <7.0% adults with diabetes     CBG: Recent Labs  Lab 07/29/19 1526 07/29/19 1936 07/29/19 2350 07/30/19 0423 07/30/19 0705  GLUCAP 130* 110* 126* 151* 121*   The patient is critically ill with multiple organ system failure and requires high complexity decision making for assessment and support, frequent evaluation and titration of therapies, advanced monitoring, review of radiographic studies and interpretation of complex data.   Critical Care Time devoted to patient care services, exclusive of separately billable procedures, described in this note is 35 minutes.   Chilton GreathousePraveen Ravenna Legore MD [facility] Pager [phone] If no answer call [phone] 07/30/2019, 9:31 AM

## 2019-07-30 NOTE — Progress Notes (Signed)
Pt self extubated. Pt currently on NRB and tolerating well at this time. RT will continue to monitor.

## 2019-07-30 NOTE — Progress Notes (Signed)
Pt self-extubated, Elink and ground team notified. VSS, SpO2 100% on 15L NRB. Other VSS. Sedation meds turned off and TF stopped. No apparent distress at this time. Will continue to monitor.

## 2019-07-30 NOTE — Progress Notes (Signed)
Spoke w/ pts sister Vaughan Basta to provide updates.  Technical brewer.

## 2019-07-31 ENCOUNTER — Inpatient Hospital Stay (HOSPITAL_COMMUNITY): Payer: Medicare Other

## 2019-07-31 DIAGNOSIS — Z7401 Bed confinement status: Secondary | ICD-10-CM

## 2019-07-31 DIAGNOSIS — B9561 Methicillin susceptible Staphylococcus aureus infection as the cause of diseases classified elsewhere: Secondary | ICD-10-CM | POA: Diagnosis not present

## 2019-07-31 DIAGNOSIS — B951 Streptococcus, group B, as the cause of diseases classified elsewhere: Secondary | ICD-10-CM | POA: Diagnosis not present

## 2019-07-31 LAB — CBC
HCT: 28.3 % — ABNORMAL LOW (ref 39.0–52.0)
Hemoglobin: 9.3 g/dL — ABNORMAL LOW (ref 13.0–17.0)
MCH: 33.3 pg (ref 26.0–34.0)
MCHC: 32.9 g/dL (ref 30.0–36.0)
MCV: 101.4 fL — ABNORMAL HIGH (ref 80.0–100.0)
Platelets: 236 10*3/uL (ref 150–400)
RBC: 2.79 MIL/uL — ABNORMAL LOW (ref 4.22–5.81)
RDW: 17.1 % — ABNORMAL HIGH (ref 11.5–15.5)
WBC: 12.1 10*3/uL — ABNORMAL HIGH (ref 4.0–10.5)
nRBC: 0 % (ref 0.0–0.2)

## 2019-07-31 LAB — BASIC METABOLIC PANEL
Anion gap: 8 (ref 5–15)
BUN: 10 mg/dL (ref 6–20)
CO2: 27 mmol/L (ref 22–32)
Calcium: 7.7 mg/dL — ABNORMAL LOW (ref 8.9–10.3)
Chloride: 110 mmol/L (ref 98–111)
Creatinine, Ser: 0.74 mg/dL (ref 0.61–1.24)
GFR calc Af Amer: >60 mL/min (ref >60)
GFR calc non Af Amer: >60 mL/min (ref >60)
Glucose, Bld: 96 mg/dL (ref 70–99)
Potassium: 3.3 mmol/L — ABNORMAL LOW (ref 3.5–5.1)
Sodium: 145 mmol/L (ref 135–145)

## 2019-07-31 LAB — GLUCOSE, CAPILLARY
Glucose-Capillary: 80 mg/dL (ref 70–99)
Glucose-Capillary: 85 mg/dL (ref 70–99)
Glucose-Capillary: 63 mg/dL — ABNORMAL LOW (ref 70–99)
Glucose-Capillary: 69 mg/dL — ABNORMAL LOW (ref 70–99)
Glucose-Capillary: 95 mg/dL (ref 70–99)
Glucose-Capillary: 105 mg/dL — ABNORMAL HIGH (ref 70–99)

## 2019-07-31 LAB — MAGNESIUM: Magnesium: 1.9 mg/dL (ref 1.7–2.4)

## 2019-07-31 LAB — PHOSPHORUS: Phosphorus: 2.4 mg/dL — ABNORMAL LOW (ref 2.5–4.6)

## 2019-07-31 LAB — URINE CULTURE: Culture: NO GROWTH

## 2019-07-31 MED ORDER — DEXTROSE 50 % IV SOLN
INTRAVENOUS | Status: AC
  Administered 2019-07-31: 50 mL
  Filled 2019-07-31: qty 50

## 2019-07-31 MED ORDER — DOCUSATE SODIUM 50 MG/5ML PO LIQD: 100.0000 mg | Freq: Two times a day (BID) | ORAL | Status: DC | PRN

## 2019-07-31 MED ORDER — DONEPEZIL HCL 5 MG PO TABS
5.0000 mg | ORAL_TABLET | Freq: Every day | ORAL | Status: DC
  Filled 2019-07-31 (×5): qty 1

## 2019-07-31 MED ORDER — MEMANTINE HCL 10 MG PO TABS
20.0000 mg | ORAL_TABLET | Freq: Every day | ORAL | Status: DC
  Filled 2019-07-31 (×5): qty 2

## 2019-07-31 MED ORDER — POTASSIUM CHLORIDE 20 MEQ/15ML (10%) PO SOLN: 20.0000 meq | ORAL | Status: DC

## 2019-07-31 MED ORDER — LEVOTHYROXINE SODIUM 88 MCG PO TABS
88.0000 ug | ORAL_TABLET | Freq: Every day | ORAL | Status: DC
  Filled 2019-07-31 (×4): qty 1

## 2019-07-31 MED ORDER — DEXTROSE 5 % IV SOLN
INTRAVENOUS | Status: DC
  Administered 2019-07-31 – 2019-08-03 (×4): via INTRAVENOUS

## 2019-07-31 MED ORDER — POTASSIUM CHLORIDE 10 MEQ/50ML IV SOLN
10.0000 meq | INTRAVENOUS | Status: AC
  Administered 2019-07-31 (×4): 10 meq via INTRAVENOUS
  Filled 2019-07-31: qty 50

## 2019-07-31 NOTE — Progress Notes (Signed)
Pt progressed from 5L [place] to NRB due to low oxygen sats. Initally pt on [place] with with O2 sats of 88% and pt looked to be mouth breathing and so changed out to veni mask. However, the veni mask did not hold the pts O2 sats and O2 sats down to 82%. Pt placed on NRB and O2 sats now 94%.  Pt lung sounds are clear but diminished and has a weak cough.   Called and made RT and [facility] aware of events. RT at bedside and aware.

## 2019-07-31 NOTE — Progress Notes (Signed)
SLP Cancellation Note  Patient Details Name: Brian Stanton MRN: [id number] DOB: 14-Mar-1961   Cancelled treatment:       Reason Eval/Treat Not Completed: Other (comment)(refused, swatting/yelling at SLP). Continue efforts    Houston Siren 07/31/2019, 4:16 PM    Orbie Pyo Colvin Caroli.Ed Risk analyst [phone] Office [phone]

## 2019-07-31 NOTE — Progress Notes (Signed)
[facility] ADULT ICU REPLACEMENT PROTOCOL FOR AM LAB REPLACEMENT ONLY  The patient does apply for the [facility] Adult ICU Electrolyte Replacment Protocol based on the criteria listed below:   1. Is GFR >/= 40 ml/min? Yes.    Patient's GFR today is >60 2. Is urine output >/= 0.5 ml/kg/hr for the last 6 hours? Yes.   Patient's UOP is 1.4 ml/kg/hr 3. Is BUN < 60 mg/dL? Yes.    Patient's BUN today is 10 4. Abnormal electrolyte(s): k 3.3 5. Ordered repletion with: protocol 6. If a panic level lab has been reported, has the CCM MD in charge been notified? No..   Physician:   Ronda Fairly A 07/31/2019 7:08 AM

## 2019-07-31 NOTE — Progress Notes (Signed)
Patient's family and shared POA's, Mother and two sisters; Bethena Roys and Vaughan Basta will have a code status meeting with Mannam tomorrow (10/18) at 34 am.

## 2019-07-31 NOTE — Progress Notes (Addendum)
NAME:  Brian Stanton, MRN:  [id number], DOB:  October 28, 1960, LOS: 3 ADMISSION DATE:  07/28/2019, CONSULTATION DATE:  07/28/2019 REFERRING MD:  Dr. Dina Rich, CHIEF COMPLAINT:  Respiratory failure  Brief History   27 yoM with hx of Down Syndrome presenting from rehab/ SNF with N/V/D found to have septic shock with aspiration pneumonia and requiring intubation for hypoxic respiratory failure.  Additionally has AKI and refracture of left hip nailing from recent hip surgery in September.    Past Medical History  Down Syndrome, intellectual disability, hypothyroidism, hip fracture, dysphagia,  chronic left UPJ obstruction  Recently hospitalized 9/22- 9/29 for bilateral hip fractures (left appearing more acute/ right more chronic) and underwent left Biomet now short trochanteric nail with interlocking and the right hip bipolar hemiarthroplasty done by Dr. Lorin Stanton on 9/23. Additionally treated for possible RLL pneumonia with ceftriaxone and azithromycin.  He was discharged to SNF/ rehab.    Goes by "Brian Stanton"  [facility] Events   10/15 Admitted 10/17-self extubated.  Did not require intubation  Consults:  Ortho  Procedures:  10/15 ETT >> 10/18 10/16 Lt IJ CVL >>  Significant Diagnostic Tests:  10/15 XR b/l hip >> 1.  Comminuted periprosthetic fracture/re-injury of the left femoral neck and intertrochanteric region with large effusion and overlying swelling. Transcervical fixation hardware stands proud of the cortex. 2.  Normal location of the right hip hemiarthroplasty with recent postsurgical changes. Alignment is suboptimally evaluated on these nondedicated images and given patient rotation.  Echo 10/17-LVEF 60 to 65%, moderate reduced RV systolic function. No obvious vegetation.  Micro Data:  10/15 SARS CoV2 >> neg 10/15 BC x 2 >> Staph Aureus, Streptococcus agalactiae 10/15 UC >> 10/15 trach asp >>  Antimicrobials:  10/15 Vanco >> 10/17 10/15 Cefepime 10/15 zosyn 10/16 10/16  Unasyn >> 10/17  Cefazolin 10/17 >> Flagyl 10/17 >>  Interim history/subjective:  Self extubated.  Did not require intubation Currently protecting airway.  Off pressors Made limited code status after discussion with family.  Objective   Blood pressure (!) 85/55, pulse 72, temperature 98.4 F (36.9 C), temperature source Axillary, resp. rate 19, height 5\' 1"  (1.549 m), weight 63 kg, SpO2 100 %.    Vent Mode: PCV FiO2 (%):  [40 %] 40 % Set Rate:  [10 bmp] 10 bmp PEEP:  [10 cmH20] 10 cmH20 Plateau Pressure:  [19 cmH20-20 cmH20] 20 cmH20   Intake/Output Summary (Last 24 hours) at 07/31/2019 1028 Last data filed at 07/31/2019 0600 Gross per 24 hour  Intake 2236.65 ml  Output 2990 ml  Net -753.35 ml   Filed Weights   07/29/19 0500 07/30/19 0430 07/31/19 0500  Weight: 58.6 kg 63.5 kg 63 kg   Examination: Blood pressure 96/67, pulse 85, temperature 98.4 F (36.9 C), temperature source Axillary, resp. rate 17, height 5\' 1"  (1.549 m), weight 63 kg, SpO2 94 %. Gen:      No acute distress HEENT:  EOMI, sclera anicteric Neck:     No masses; no thyromegaly Lungs:    Clear to auscultation bilaterally; normal respiratory effort CV:         Regular rate and rhythm; no murmurs Abd:      + bowel sounds; soft, non-tender; no palpable masses, no distension Ext:    No edema; adequate peripheral perfusion Skin:      Warm and dry; no rash Neuro: Awake, no focal deficits.   Resolved Hospital Problem list    Assessment & Plan:  Acute hypoxic respiratory failure requiring intubation -in  setting of aspiration PNA -possible component of atelectasis in setting of recent ortho surgeries  -COVID-19 negative  P:  Continue mechanical support Switched to [facility] due to dyssynchrony Wean PEEP/FiO2 Intermittent chest x-ray  Sepsis. MSSA, group B strep bacteremia P:  Antibiotics per ID TTE negative for vegetation. May need TEE ID on board  Bilateral hip fractures s/p repair 07/06/19 -R femoral  neck fx s/p bipolar hemiarthroplasty with cemented stem -L comminuted intertrochanteric hip fx s/p biomet short trochanteric nail with interlock - sister reports he recently fell out of bedearly last week -Discharged to SNF P:  Ortho following.  Recommending transfer to tertiary care once he is better for management of complex hip fracture.  Prolonged QTc P:  Monitor QTc/ tele Avoid QTc prolonging meds including antiemetics  AKI Lactic acidosis.  Resolved Hx of urinary retention - prior found to have chronic left UPJ obstruction without  - very distended bladder on presentation P:  Monitor urine output and creatinine  Down syndrome/ dementia/ intellect disability Significant anxiety with environmental change P:  Continue home Donepezil, Namenda Trazodone  Delirium Precautions  Frequent re-orientation and facilitation of calm environment Non-pharmacologic interventions are preferred-- enjoys children's books; d/w nurse RE audible books   Hypothyroidism P:  Synthroid 88mg  per tube  Hyperglycemia P:  CBG q 4 with SSI Check A1c  Protein calorie malnutrition/ failure to thrive P:  RDN consult for EN   Hx vomiting/ diarrhea P:  Monitor for diarrhea Avoiding antimetics with prolonged QTc  Goals of care Discussed with the healthcare power of attorney last night and by me today.  CODE STATUS changed to limited code with no CPR.  Brian Stanton tells me that Brian Stanton had a good quality of life before the hip issues.  They have not seen him for several months now and she is on way in to be at his bedside.  Continue goals of care discussion.    For now we will reintubate short term if needed. Brian Stanton is clear that we would not want long-term trach, PEG  Best practice:  Diet: EN per RDN  Pain/Anxiety/Delirium protocol (if indicated): prn fentanyl/ versed for RASS goal 0/-1 VAP protocol (if indicated): yes DVT prophylaxis: SQ heparin GI prophylaxis: PPI Glucose control: SSI  sensitive/ q 4hr CBG Mobility: BR Code Status: full Family Communication: mother Brian Stanton is 83 and reportedly demented.  Sisters [phone] or [phone] are primary contacts.  Brian Stanton called 10/18 Disposition: Remain in ICU   Labs   CBC: Recent Labs  Lab 07/28/19 0301 07/28/19 0605 07/29/19 0016 07/29/19 0235 07/30/19 0434 07/31/19 0601  WBC 17.8*  --   --  24.5* 27.6* 12.1*  NEUTROABS 16.9*  --   --   --   --   --   HGB 13.3 13.9 8.8* 9.2* 9.3* 9.3*  HCT 43.4 41.0 26.0* 29.1* 30.1* 28.3*  MCV 104.8*  --   --  103.2* 102.7* 101.4*  PLT 488*  --   --  323 328 236    Basic Metabolic Panel: Recent Labs  Lab 07/28/19 0301 07/28/19 0605 07/28/19 07/30/19 07/29/19 0016 07/29/19 0235 07/30/19 0434 07/31/19 0601  NA 141 146*  --  145 141 143 145  K 3.3* 3.3*  --  3.6 3.3* 3.5 3.3*  CL 99  --   --   --  113* 114* 110  CO2 27  --   --   --  24 21* 27  GLUCOSE 180*  --   --   --  112* 138* 96  BUN 22*  --   --   --  12 12 10   CREATININE 1.46*  --   --   --  0.90 0.92 0.74  CALCIUM 7.9*  --   --   --  7.7* 7.5* 7.7*  MG  --   --  2.2  --  1.8 2.1 1.9  PHOS  --   --  3.6  --  1.9* 1.6* 2.4*   GFR: Estimated Creatinine Clearance: 79.6 mL/min (by C-G formula based on SCr of 0.74 mg/dL). Recent Labs  Lab 07/28/19 0301 07/28/19 0638 07/29/19 0235 07/30/19 0434 07/31/19 0601  WBC 17.8*  --  24.5* 27.6* 12.1*  LATICACIDVEN 4.5* 5.9* 1.9  --   --     Liver Function Tests: Recent Labs  Lab 07/28/19 0301 07/29/19 0235  AST 31 19  ALT 14 9  ALKPHOS 98 76  BILITOT 0.6 1.1  PROT 5.7* 4.6*  ALBUMIN 2.0* 2.2*   Recent Labs  Lab 07/28/19 0301  LIPASE 46   No results for input(s): AMMONIA in the last 168 hours.  ABG    Component Value Date/Time   PHART 7.464 (H) 07/29/2019 0016   PCO2ART 30.8 (L) 07/29/2019 0016   PO2ART 58.0 (L) 07/29/2019 0016   HCO3 22.1 07/29/2019 0016   TCO2 23 07/29/2019 0016   ACIDBASEDEF 1.0 07/29/2019 0016   O2SAT 92.0  07/29/2019 0016     Coagulation Profile: Recent Labs  Lab 07/28/19 0301  INR 1.4*    Cardiac Enzymes: No results for input(s): CKTOTAL, CKMB, CKMBINDEX, TROPONINI in the last 168 hours.  HbA1C: Hgb A1c MFr Bld  Date/Time Value Ref Range Status  07/28/2019 06:38 AM 4.7 (L) 4.8 - 5.6 % Final    Comment:    (NOTE) Pre diabetes:          5.7%-6.4% Diabetes:              >6.4% Glycemic control for   <7.0% adults with diabetes     CBG: Recent Labs  Lab 07/30/19 1514 07/30/19 2016 07/30/19 2340 07/31/19 0341 07/31/19 0705  GLUCAP 115* 123* 85 80 85   The patient is critically ill with multiple organ system failure and requires high complexity decision making for assessment and support, frequent evaluation and titration of therapies, advanced monitoring, review of radiographic studies and interpretation of complex data.   Critical Care Time devoted to patient care services, exclusive of separately billable procedures, described in this note is 35 minutes.   Chilton GreathousePraveen Jessiah Wojnar MD  Pulmonary and Critical Care Pager [phone] If no answer call [phone] 07/31/2019, 10:28 AM

## 2019-07-31 NOTE — Progress Notes (Signed)
[place] [facility]   Reason for visit: Follow up on polymicrobial sepsis (MSSA & GBS)  Antibiotics: Cefazolin, day 2 + 3 days of vanc/unasyn Flagyl, day 2  Interval History: pt weaned off pressors o/n but also self-extubated himself from the vent early this morning as well. He is non-verbal per his baseline but does yell out incoherently on occasion when he wants the nurse's or my attention. He remains in mittens as he continues to pull at his IVs and lines. Speech therapy evaluation is pending for later today to assess his swallowing. TTE performed yesterday and showed no evidence of endocarditis. Fever curve, WBC & Cr trends, imaging, cx results, and ABX usage all independently reviewed.   Current Facility-Administered Medications:  .  dextrose 50 % solution, , , ,  .  0.9 %  sodium chloride infusion, 250 mL, Intravenous, Continuous, Bowser, Laurel Dimmer, NP, Stopped at 07/29/19 0622 .  acetaminophen (TYLENOL) solution 650 mg, 650 mg, Per Tube, Q6H PRN, Cristal Generous, NP, 650 mg at 07/28/19 1700 .  artificial tears (LACRILUBE) ophthalmic ointment, , Both Eyes, Q4H PRN, Bowser, Laurel Dimmer, NP .  bisacodyl (DULCOLAX) suppository 10 mg, 10 mg, Rectal, Daily PRN, Jennelle Human B, NP .  ceFAZolin (ANCEF) IVPB 2g/100 mL premix, 2 g, Intravenous, Q8H, Kable Haywood, Evern Core, MD, Last Rate: 200 mL/hr at 07/31/19 0556 .  Chlorhexidine Gluconate Cloth 2 % PADS 6 each, 6 each, Topical, Q0600, Shellia Cleverly, MD, 6 each at 07/30/19 1042 .  collagenase (SANTYL) ointment, , Topical, Daily, Yacoub, Wesam G, MD .  dexmedetomidine (PRECEDEX) 400 MCG/100ML (4 mcg/mL) infusion, 0.1-0.4 mcg/kg/hr, Intravenous, Titrated, Bowser, Laurel Dimmer, NP, Stopped at 07/31/19 [id number] .  docusate (COLACE) 50 MG/5ML liquid 100 mg, 100 mg, Per Tube, BID PRN, Jennelle Human B, NP .  donepezil (ARICEPT) tablet 5 mg, 5 mg, Per Tube, Daily, Bowser, Grace E, NP, 5 mg at 07/30/19 1044 .  feeding supplement (VITAL AF 1.2  CAL) liquid 1,000 mL, 1,000 mL, Per Tube, Continuous, Rush Farmer, MD, Stopped at 07/30/19 2100 .  haloperidol lactate (HALDOL) injection 2 mg, 2 mg, Intravenous, Q6H PRN, Jennelle Human B, NP .  heparin injection 5,000 Units, 5,000 Units, Subcutaneous, Q8H, Jennelle Human B, NP, 5,000 Units at 07/31/19 0553 .  insulin aspart (novoLOG) injection 0-9 Units, 0-9 Units, Subcutaneous, Q4H, Jennelle Human B, NP, 1 Units at 07/30/19 2030 .  ipratropium-albuterol (DUONEB) 0.5-2.5 (3) MG/3ML nebulizer solution 3 mL, 3 mL, Nebulization, Q4H PRN, Jennelle Human B, NP .  levothyroxine (SYNTHROID) tablet 88 mcg, 88 mcg, Per Tube, Q0600, Jennelle Human B, NP, 88 mcg at 07/30/19 0556 .  memantine (NAMENDA) tablet 20 mg, 20 mg, Per Tube, Daily, Bowser, Grace E, NP, 20 mg at 07/30/19 1044 .  metroNIDAZOLE (FLAGYL) IVPB 500 mg, 500 mg, Intravenous, Q8H, Verland Sprinkle, Evern Core, MD, Last Rate: 100 mL/hr at 07/31/19 0641, 500 mg at 07/31/19 0641 .  norepinephrine (LEVOPHED) 16 mg in 256mL premix infusion, 0-40 mcg/min, Intravenous, Titrated, Mannam, Praveen, MD, Last Rate: 1.88 mL/hr at 07/31/19 0508, 2 mcg/min at 07/31/19 0508 .  oxybutynin (DITROPAN) tablet 5 mg, 5 mg, Oral, Q8H PRN, Franchot Gallo, MD .  pantoprazole sodium (PROTONIX) 40 mg/20 mL oral suspension 40 mg, 40 mg, Per Tube, Daily, Mannam, Praveen, MD, 40 mg at 07/30/19 1044 .  potassium chloride 10 mEq in 50 mL *CENTRAL LINE* IVPB, 10 mEq, Intravenous, Q1 Hr x 4, Mannam, Praveen, MD, Last Rate: 50 mL/hr at  07/31/19 1030, 10 mEq at 07/31/19 1030 .  traZODone (DESYREL) tablet 25 mg, 25 mg, Per Tube, QHS, Bowser, Kaylyn LayerGrace E, NP, 25 mg at 07/29/19 2104   Physical Exam:   Vitals:   07/31/19 1000 07/31/19 1034  BP: 96/67   Pulse: 76 85  Resp: 13 17  Temp:    SpO2: 97% 94%   Physical Exam Lines: LT IJ TLC, PIV x 2 Foley Gen: chronically ill, nonverbal per baseline but does maintain gaze with intention and follows simple commands Head: NCAT, no temporal  wasting evident EENT: PERRL, EOMI, MMM, poor dentition Neck: supple, no JVD CV: NRRR, no murmurs evident Pulm: rhonchorus throughout, especially to LT base Abd: soft, NTND, +BS Extrems: 1+ non-pitting LE edema, 1+ pulses GU: scrotal decubital ulcer bandaged with only serous drainage seen at present, +scrotal edema but minimal surrounding erythema Skin: no rashes/lesions aside from +scrotal ulcer, adequate skin turgor Neuro: follows some simple commands, nonverbal aside from yelling out to gain attention of provider and nurse, no focal neuro deficit appreciated but exam is challenging due to Down's syndrome/MR and bed bound state due to bilateral hip fxs  Review of Systems:  Review of Systems  Unable to perform ROS: Mental acuity  and pt is nonverbal  Lab Results  Component Value Date   WBC 12.1 (H) 07/31/2019   HGB 9.3 (L) 07/31/2019   HCT 28.3 (L) 07/31/2019   MCV 101.4 (H) 07/31/2019   PLT 236 07/31/2019    Lab Results  Component Value Date   CREATININE 0.74 07/31/2019   BUN 10 07/31/2019   NA 145 07/31/2019   K 3.3 (L) 07/31/2019   CL 110 07/31/2019   CO2 27 07/31/2019    Lab Results  Component Value Date   ALT 9 07/29/2019   AST 19 07/29/2019   ALKPHOS 76 07/29/2019     Microbiology: Recent Results (from the past 240 hour(s))  Blood Culture (routine x 2)     Status: Abnormal   Collection Time: 07/28/19  2:52 AM   Specimen: BLOOD  Result Value Ref Range Status   Specimen Description BLOOD LEFT ANTECUBITAL  Final   Special Requests   Final    BOTTLES DRAWN AEROBIC AND ANAEROBIC Blood Culture adequate volume   Culture  Setup Time   Final    IN BOTH AEROBIC AND ANAEROBIC BOTTLES GRAM POSITIVE COCCI CRITICAL RESULT CALLED TO, READ BACK BY AND VERIFIED WITH: Lytle ButteM MACCIA [facility] 07/28/19 1746 JDW Performed at [facility], [address] [place], [place] [id number]    Culture STAPHYLOCOCCUS AUREUS STREPTOCOCCUS AGALACTIAE  (A)  Final   Report Status 07/30/2019  FINAL  Final   Organism ID, Bacteria STAPHYLOCOCCUS AUREUS  Final   Organism ID, Bacteria STREPTOCOCCUS AGALACTIAE  Final      Susceptibility   Streptococcus agalactiae - MIC*    CLINDAMYCIN >=1 RESISTANT Resistant     AMPICILLIN <=0.25 SENSITIVE Sensitive     ERYTHROMYCIN >=8 RESISTANT Resistant     VANCOMYCIN 0.5 SENSITIVE Sensitive     CEFTRIAXONE <=0.12 SENSITIVE Sensitive     LEVOFLOXACIN 1 SENSITIVE Sensitive     * STREPTOCOCCUS AGALACTIAE   Staphylococcus aureus - MIC*    CIPROFLOXACIN <=0.5 SENSITIVE Sensitive     ERYTHROMYCIN >=8 RESISTANT Resistant     GENTAMICIN <=0.5 SENSITIVE Sensitive     OXACILLIN 0.5 SENSITIVE Sensitive     TETRACYCLINE <=1 SENSITIVE Sensitive     VANCOMYCIN <=0.5 SENSITIVE Sensitive     TRIMETH/SULFA <=10  SENSITIVE Sensitive     CLINDAMYCIN RESISTANT Resistant     RIFAMPIN <=0.5 SENSITIVE Sensitive     Inducible Clindamycin POSITIVE Resistant     * STAPHYLOCOCCUS AUREUS  Blood Culture (routine x 2)     Status: None (Preliminary result)   Collection Time: 07/28/19  3:05 AM   Specimen: BLOOD LEFT HAND  Result Value Ref Range Status   Specimen Description BLOOD LEFT HAND  Final   Special Requests   Final    BOTTLES DRAWN AEROBIC AND ANAEROBIC Blood Culture adequate volume   Culture   Final    NO GROWTH 3 DAYS Performed at [facility], [address], [place], [place] [address]    Report Status PENDING  Incomplete  SARS Coronavirus 2 by RT PCR (hospital order, performed in [facility] hospital lab) Nasopharyngeal Nasopharyngeal Swab     Status: None   Collection Time: 07/28/19  3:28 AM   Specimen: Nasopharyngeal Swab  Result Value Ref Range Status   SARS Coronavirus 2 NEGATIVE NEGATIVE Final    Comment: (NOTE) If result is NEGATIVE SARS-CoV-2 target nucleic acids are NOT DETECTED. The SARS-CoV-2 RNA is generally detectable in upper and lower  respiratory specimens during the acute phase of infection. The lowest  concentration of  SARS-CoV-2 viral copies this assay can detect is 250  copies / mL. A negative result does not preclude SARS-CoV-2 infection  and should not be used as the sole basis for treatment or other  patient management decisions.  A negative result may occur with  improper specimen collection / handling, submission of specimen other  than nasopharyngeal swab, presence of viral mutation(s) within the  areas targeted by this assay, and inadequate number of viral copies  (<250 copies / mL). A negative result must be combined with clinical  observations, patient history, and epidemiological information. If result is POSITIVE SARS-CoV-2 target nucleic acids are DETECTED. The SARS-CoV-2 RNA is generally detectable in upper and lower  respiratory specimens dur ing the acute phase of infection.  Positive  results are indicative of active infection with SARS-CoV-2.  Clinical  correlation with patient history and other diagnostic information is  necessary to determine patient infection status.  Positive results do  not rule out bacterial infection or co-infection with other viruses. If result is PRESUMPTIVE POSTIVE SARS-CoV-2 nucleic acids MAY BE PRESENT.   A presumptive positive result was obtained on the submitted specimen  and confirmed on repeat testing.  While 2019 novel coronavirus  (SARS-CoV-2) nucleic acids may be present in the submitted sample  additional confirmatory testing may be necessary for epidemiological  and / or clinical management purposes  to differentiate between  SARS-CoV-2 and other Sarbecovirus currently known to infect humans.  If clinically indicated additional testing with an alternate test  methodology [phone]) is advised. The SARS-CoV-2 RNA is generally  detectable in upper and lower respiratory sp ecimens during the acute  phase of infection. The expected result is Negative. Fact Sheet for Patients:  BoilerBrush.com.cy Fact Sheet for Healthcare  Providers: https://pope.com/ This test is not yet approved or cleared by the [place] FDA and has been authorized for detection and/or diagnosis of SARS-CoV-2 by FDA under an Emergency Use Authorization (EUA).  This EUA will remain in effect (meaning this test can be used) for the duration of the COVID-19 declaration under Section 564(b)(1) of the Act, 21 U.S.C. section 360bbb-3(b)(1), unless the authorization is terminated or revoked sooner. Performed at [facility], [address] [place], [place] [address]   MRSA PCR Screening     Status: None   Collection Time: 07/28/19  7:25 AM   Specimen: Nasopharyngeal  Result Value Ref Range Status   MRSA by PCR NEGATIVE NEGATIVE Final    [address]ent:        The GeneXpert MRSA Assay (FDA approved for NASAL specimens only), is one component of a comprehensive MRSA colonization surveillance program. It is not intended to diagnose MRSA infection nor to guide or monitor treatment for MRSA infections. Performed at [facility], [address], [place], [place] [address]   Urine culture     Status: None   Collection Time: 07/30/19  9:24 AM   Specimen: In/Out Cath Urine  Result Value Ref Range Status   Specimen Description IN/OUT CATH URINE  Final   Special Requests NONE  Final   Culture   Final    NO GROWTH Performed at [facility], [address] [place], [place] [address]    Report Status 07/31/2019 FINAL  Final  Culture, blood (Routine X 2) w Reflex to ID Panel     Status: None (Preliminary result)   Collection Time: 07/30/19  9:50 AM   Specimen: BLOOD RIGHT HAND  Result Value Ref Range Status   Specimen Description BLOOD RIGHT HAND  Final   Special Requests   Final    BOTTLES DRAWN AEROBIC ONLY Blood Culture adequate volume   Culture   Final    NO GROWTH < 24 HOURS Performed at [facility], [address], [place], [place] [address]    Report Status PENDING  Incomplete   Culture, blood (Routine X 2) w Reflex to ID Panel     Status: None (Preliminary result)   Collection Time: 07/30/19 10:00 AM   Specimen: BLOOD LEFT HAND  Result Value Ref Range Status   Specimen Description BLOOD LEFT HAND  Final   Special Requests   Final    BOTTLES DRAWN AEROBIC ONLY Blood Culture results may not be optimal due to an inadequate volume of blood received in culture bottles   Culture   Final    NO GROWTH < 24 HOURS Performed at [facility], [address], [place], [place] [address]    Report Status PENDING  Incomplete  Culture, respiratory (tracheal aspirate)     Status: None (Preliminary result)   Collection Time: 07/30/19 11:21 AM   Specimen: Tracheal Aspirate; Respiratory  Result Value Ref Range Status   Specimen Description TRACHEAL ASPIRATE  Final   Special Requests NONE  Final   Gram Stain NO WBC SEEN NO ORGANISMS SEEN   Final   Culture   Final    CULTURE REINCUBATED FOR BETTER GROWTH Performed at [facility], [address], [place], [place] [address]    Report Status PENDING  Incomplete    Impression/Plan: The patient is a 58 y/o male SNF resident with Down's syndrome and recent bilateral hip fxs, s/p ORIF admitted with fever, leukocytosis, polymicrobial sepsis, scrotal wound, and probable aspiration PNA.  1. Polymicrobial sepsis -both the patient's group B strep and MSSA likely have originated from his scrotal wound.  Repeat blood cultures thus far show no growth to date.  TTE yesterday showed no evidence of endocarditis. Continue cefazolin 2 g every 8 hours and Flagyl 500 mg IV every 8 hours for now.  This should also cover concurrently for his possible aspiration pneumonia.  Duration of antibiotics is yet undetermined.  Given his prolonged pressor requirement, as his TTE is unrevealing, would proceed to a TEE once able to assist in determining duration of ABX treatment.  2.  Aspiration pneumonia -the patient's body position does lend itself  to this occurring. He self-extubated from the ventilator early this AM. Would ensure the patient's head of bed is always at 30 degrees or higher and that he is properly positioned to reduce his risk of further aspiration.  I adjusted his antibiotics as noted above.  I agree with having speech therapy assess the patient's swallowing prior to initiation of a diet.   3.  Leukocytosis - Significant improvement since changing his ABX to cefazolin/flagyl yesterday following confirmation of MSSA/GBS on initial blood cxs.  Would continue to check CBC with differential daily to follow his white blood cell count trend until this normalizes.  Should further repeat blood cultures remain positive, may need to consider removing his triple-lumen catheter as this was placed while he was still actively bacteremic (exchange over a guidewire would be acceptable).  He also does have recent bilateral hip fractures, so we will pay close attention to his wound sites and would have a low threshold for imaging to ensure he does not have a postoperative abscess that is an appreciable via clinical exam.

## 2019-08-01 ENCOUNTER — Inpatient Hospital Stay: Payer: Medicare Other | Admitting: Orthopedic Surgery

## 2019-08-01 DIAGNOSIS — N492 Inflammatory disorders of scrotum: Secondary | ICD-10-CM

## 2019-08-01 DIAGNOSIS — J1520 Pneumonia due to staphylococcus, unspecified: Secondary | ICD-10-CM

## 2019-08-01 DIAGNOSIS — A401 Sepsis due to streptococcus, group B: Secondary | ICD-10-CM

## 2019-08-01 DIAGNOSIS — Z930 Tracheostomy status: Secondary | ICD-10-CM

## 2019-08-01 DIAGNOSIS — Z7189 Other specified counseling: Secondary | ICD-10-CM

## 2019-08-01 DIAGNOSIS — J15211 Pneumonia due to Methicillin susceptible Staphylococcus aureus: Secondary | ICD-10-CM

## 2019-08-01 LAB — CBC
HCT: 30.6 % — ABNORMAL LOW (ref 39.0–52.0)
Hemoglobin: 10 g/dL — ABNORMAL LOW (ref 13.0–17.0)
MCH: 31.9 pg (ref 26.0–34.0)
MCHC: 32.7 g/dL (ref 30.0–36.0)
MCV: 97.8 fL (ref 80.0–100.0)
Platelets: 244 10*3/uL (ref 150–400)
RBC: 3.13 MIL/uL — ABNORMAL LOW (ref 4.22–5.81)
RDW: 16.9 % — ABNORMAL HIGH (ref 11.5–15.5)
WBC: 8.2 10*3/uL (ref 4.0–10.5)
nRBC: 0.2 % (ref 0.0–0.2)

## 2019-08-01 LAB — BASIC METABOLIC PANEL
Anion gap: 6 (ref 5–15)
BUN: 7 mg/dL (ref 6–20)
CO2: 28 mmol/L (ref 22–32)
Calcium: 8 mg/dL — ABNORMAL LOW (ref 8.9–10.3)
Chloride: 109 mmol/L (ref 98–111)
Creatinine, Ser: 0.74 mg/dL (ref 0.61–1.24)
GFR calc Af Amer: >60 mL/min (ref >60)
GFR calc non Af Amer: >60 mL/min (ref >60)
Glucose, Bld: 102 mg/dL — ABNORMAL HIGH (ref 70–99)
Potassium: 3.3 mmol/L — ABNORMAL LOW (ref 3.5–5.1)
Sodium: 143 mmol/L (ref 135–145)

## 2019-08-01 LAB — GLUCOSE, CAPILLARY
Glucose-Capillary: 140 mg/dL — ABNORMAL HIGH (ref 70–99)
Glucose-Capillary: 66 mg/dL — ABNORMAL LOW (ref 70–99)
Glucose-Capillary: 84 mg/dL (ref 70–99)
Glucose-Capillary: 91 mg/dL (ref 70–99)
Glucose-Capillary: 93 mg/dL (ref 70–99)
Glucose-Capillary: 93 mg/dL (ref 70–99)
Glucose-Capillary: 96 mg/dL (ref 70–99)

## 2019-08-01 LAB — MAGNESIUM: Magnesium: 2 mg/dL (ref 1.7–2.4)

## 2019-08-01 LAB — PHOSPHORUS: Phosphorus: 2.2 mg/dL — ABNORMAL LOW (ref 2.5–4.6)

## 2019-08-01 MED ORDER — POTASSIUM CHLORIDE 10 MEQ/100ML IV SOLN
10.0000 meq | INTRAVENOUS | Status: AC
  Administered 2019-08-01 (×2): 10 meq via INTRAVENOUS
  Filled 2019-08-01 (×2): qty 100

## 2019-08-01 MED ORDER — DEXTROSE 50 % IV SOLN
12.5000 g | INTRAVENOUS | Status: AC
  Administered 2019-08-01: 12.5 g via INTRAVENOUS

## 2019-08-01 NOTE — Progress Notes (Deleted)
PULMONARY / CRITICAL CARE MEDICINE   Name: Brian Stanton, Brian Stanton" MRN: [id number] DOB: 1961/04/30    ADMISSION DATE:  07/28/2019 CONSULTATION DATE:  07/28/2019  REFERRING MD:  Dr. Dina Rich   CHIEF COMPLAINT:  Respiratory Failure   HISTORY OF PRESENT ILLNESS:   58 yoM with hx of Down Syndrome presenting from rehab/ SNF with N/V/D found to have septic shock with aspiration pneumonia and requiring intubation for hypoxic respiratory failure. Additionally has AKI and refracture of left hip nailing from recent hip surgery in September.  PAST MEDICAL HISTORY :  He  has a past medical history of Down's syndrome, GERD (gastroesophageal reflux disease), and Mental retardation. Hypothyroidism, hip fracture, dysphagia, chronic left UPJ obstruction  Recently hospitalized 9/22- 9/29 for bilateral hip fractures (left appearing more acute/ right more chronic) and underwent left Biomet now short trochanteric nail with interlocking and the right hip bipolar hemiarthroplasty done by Dr. Lorin Mercy on 9/23. Additionally treated for possible RLL pneumonia with ceftriaxone and azithromycin.  He was discharged to SNF/ rehab.    He is nonverbal at baseline but can yell incoherently on occasional when he wants someone's attention.  PAST SURGICAL HISTORY: He  has a past surgical history that includes Intramedullary (im) nail intertrochanteric (Left, 07/06/2019) and Hip Arthroplasty (Right, 07/06/2019).  No Known Allergies  No current facility-administered medications on file prior to encounter.    Current Outpatient Medications on File Prior to Encounter  Medication Sig  . acetaminophen (TYLENOL) 325 MG tablet Take 2 tablets (650 mg total) by mouth every 6 (six) hours as needed for mild pain. (Patient taking differently: Take 650 mg by mouth 2 times daily at 12 noon and 4 pm. )  . aspirin EC 325 MG EC tablet Take 1 tablet (325 mg total) by mouth daily with breakfast.  . cholecalciferol (VITAMIN D3) 25 MCG (1000 UT)  tablet Take 1,000 Units by mouth daily.  Marland Kitchen docusate sodium (COLACE) 100 MG capsule Take 100 mg by mouth 2 (two) times daily.  Marland Kitchen donepezil (ARICEPT) 5 MG tablet Take 5 mg by mouth daily.  . Ensure (ENSURE) Take 237 mLs by mouth 3 (three) times daily between meals.  Marland Kitchen guaiFENesin (MUCINEX) 600 MG 12 hr tablet Take 600 mg by mouth 2 (two) times daily.  . hydrocortisone (ANUSOL-HC) 25 MG suppository Place 25 mg rectally 3 (three) times daily.   . Infant Care Products (BABY SHAMPOO EX) Apply 1 application topically daily. Use to wash eyes and face daily  . iron polysaccharides (NIFEREX) 150 MG capsule Take 1 capsule (150 mg total) by mouth daily.  Marland Kitchen levothyroxine (SYNTHROID) 88 MCG tablet Take 88 mcg by mouth daily before breakfast.  . lubiprostone (AMITIZA) 24 MCG capsule Take 24 mcg by mouth 2 (two) times a day.  . Melatonin 3 MG TABS Take 3 mg by mouth at bedtime.  . memantine (NAMENDA XR) 28 MG CP24 24 hr capsule Take 28 mg by mouth daily.  . mirabegron ER (MYRBETRIQ) 25 MG TB24 tablet Take 25 mg by mouth daily.   . ondansetron (ZOFRAN) 4 MG tablet Take 1 tablet (4 mg total) by mouth every 6 (six) hours as needed for nausea.  . Probiotic Product (ALIGN PO) Take 1 capsule by mouth every evening.  . promethazine (PHENERGAN) 25 MG suppository Place 25 mg rectally every 4 (four) hours as needed for nausea or vomiting.  . silver sulfADIAZINE (SILVADENE) 1 % cream Apply 1 application topically 2 (two) times daily.  . Sunscreens (BLISTEX LIP BALM EX) Apply  1 application topically 3 (three) times daily.  . traMADol (ULTRAM) 50 MG tablet Take 1 tablet (50 mg total) by mouth every 12 (twelve) hours as needed for moderate pain. (Patient taking differently: Take 50 mg by mouth 3 (three) times daily. )  . traZODone (DESYREL) 50 MG tablet Take 50 mg by mouth at bedtime.     FAMILY HISTORY:  His He indicated that the status of his neg hx is unknown.   SOCIAL HISTORY: He  reports that he has never smoked.  He has never used smokeless tobacco. He reports that he does not drink alcohol or use drugs.  SUBJECTIVE:  Remains in mittens as he continues to pull at IVs and Lines. Patient more active since self extubation on 10/18. He has intermittent episodes of hitting/agression. Continues to decline SLP/swallow eval.   VITAL SIGNS: BP (!) 92/51   Pulse 81   Temp 98.3 F (36.8 C) (Axillary)   Resp (!) 31   Ht 5\' 1"  (1.549 m)   Wt 59.4 kg   SpO2 96%   BMI 24.74 kg/m   VENTILATOR SETTINGS:   None  Oxygen Settings: 5L [place]  INTAKE / OUTPUT: I/O last 3 completed shifts: In: 2054.6 [I.V.:847.3; Other:80; NG/GT:120; IV Piggyback:1007.3] Out: 5450 [Urine:5450]  PHYSICAL EXAMINATION: General: No acute distress  HEENT: EOMI, sclera anicteric Neck: no masses, no thyromegaly CV: regular rate and rhythm without murmurs, rubs, or gallops, mild LE edema Lungs: clear to auscultation bilaterally with normal work of breathing on 5L [place], some transmitted upper respiratory sounds throughout Abdomen: soft, non-tender, non-distended, normoactive bowel sounds Skin: warm, dry, penis appears swollen but pink without any obvious discharge however exam difficult due to hostility of patient Extremities: warm and well perfused Neuro: Awake, no focal deficits  LABS:  BMET Recent Labs  Lab 07/30/19 0434 07/31/19 0601 08/01/19 0441  NA 143 145 143  K 3.5 3.3* 3.3*  CL 114* 110 109  CO2 21* 27 28  BUN 12 10 7   CREATININE 0.92 0.74 0.74  GLUCOSE 138* 96 102*    Electrolytes Recent Labs  Lab 07/30/19 0434 07/31/19 0601 08/01/19 0441  CALCIUM 7.5* 7.7* 8.0*  MG 2.1 1.9 2.0  PHOS 1.6* 2.4* 2.2*    CBC Recent Labs  Lab 07/30/19 0434 07/31/19 0601 08/01/19 0441  WBC 27.6* 12.1* 8.2  HGB 9.3* 9.3* 10.0*  HCT 30.1* 28.3* 30.6*  PLT 328 236 244    Coag's Recent Labs  Lab 07/28/19 0301  APTT 32  INR 1.4*    Sepsis Markers Recent Labs  Lab 07/28/19 0301 07/28/19 0638 07/29/19 0235   LATICACIDVEN 4.5* 5.9* 1.9    ABG Recent Labs  Lab 07/28/19 0605 07/29/19 0016  PHART 7.276* 7.464*  PCO2ART 50.1* 30.8*  PO2ART 69.0* 58.0*    Liver Enzymes Recent Labs  Lab 07/28/19 0301 07/29/19 0235  AST 31 19  ALT 14 9  ALKPHOS 98 76  BILITOT 0.6 1.1  ALBUMIN 2.0* 2.2*    Cardiac Enzymes No results for input(s): TROPONINI, PROBNP in the last 168 hours.  Glucose Recent Labs  Lab 07/31/19 0705 07/31/19 1112 07/31/19 1505 07/31/19 1916 07/31/19 2301 08/01/19 0357  GLUCAP 85 63* 69* 95 105* 96    Imaging No results found.  10/15 XR b/l hip >> 1.  Comminuted periprosthetic fracture/re-injury of the left femoral neck and intertrochanteric region with large effusion and overlying swelling. Transcervical fixation hardware stands proud of the cortex. 2.  Normal location of the right hip hemiarthroplasty  with recent postsurgical changes. Alignment is suboptimally evaluated on these nondedicated images and given patient rotation.  Echo 10/17-LVEF 60 to 65%, moderate reduced RV systolic function. No obvious vegetation.  Micro Data/CULTURES: 10/15 SARS CoV2 >> neg 10/15 BC x 2 >> Staph Aureus, Streptococcus agalactiae 10/15 UC >> No growth 10/15 trach asp >> Few staph aureus  ANTIBIOTICS: 10/15 Vanco >> 10/17 10/15 Cefepime 10/15 zosyn 10/16 10/16 Unasyn >> 10/17  Cefazolin 10/17 >> Flagyl 10/17 >>  SIGNIFICANT EVENTS: 10/15 Admitted 10/17-self extubated.  Did not require intubation. Currently protecting airway.  10/18 Transitioned to partial code  LINES/TUBES: Left PIV  DISCUSSION: Mr. Luhman is a 58 year old male and SNF resident with Down's syndrome and recent bilateral hip fractures s/p ORIF on 9/23 admitted for fever, leukocytosis, polymicrobial sepsis, scrotal wound, and probable aspiration pneumonia.  ASSESSMENT / PLAN:  PULMONARY A: Acute hypoxic respiratory failure requiring intubation, improving - in setting of aspiration PNA -  possible component of atelectasis in setting of recent ortho surgeries - covid negative - Required transition to NRB 2/2 to hypoxia overnight P:   Head of bed >30 degrees to reduce risk of aspiration Continue oxygen support Intermittent chest x-ray  INFECTIOUS A:  Polymicrobial Sepsis - MSSA and GBS bacteremia - TTE negative for vegetations  - Repeat blood cultures negative to date  P:   - Infectious disease consulted, appreciated recs - currently on Cefazolin and Flagyl (10/17-) - Antibiotics per ID - consider TEE given TTE unrevealing  - daily CBC with diff  MUSCULOSKELETAL: A: Bilateral hip fractures s/p repair 07/06/19 - s/p recent fall last week per sister - Right femoral neck fracture s/p bipolar hemiarthroplasty with cemented stem - Left comminuted intertrochanteric hip fracture s/p biomet short trochanteric nail with interlock  P: - Ortho consulted, appreciate recs   - Recommend transfer to tertiary care once stable for management of complex hip fracture  CARDIOVASCULAR A: Prolonged QTc  - QTc 432 on 10/17 P:  - avoid QT prolonging agents including antiemetics - monitor on tele  RENAL A:  AKI, resolved - History of urinary retention with chronic left UPJ obstruction - Creatinine back at baseline (1.46>>0.74) P:   - daily BMP - avoid nephrotoxic agents  - monitor urine output and creatinine  GASTROINTESTINAL A:  Hypokalemia - Potassium 3.3 this AM P:  - replete as needed  A: Protein Calorie Malnutrition  Failture to Thrive: - currently NPO with D5 at 13mL/hr and electrolyte repletion PRN - continues to decline Swallow eval by SLP - aspiration risk P:  - GOC discussion planned for today (10/19) with Dr. Isaiah Serge, discuss diet goals - continue to attempt swallow study until further notice  - consider addition of TPN if remains NPO, likely will not tolerate NG tube  HEMATOLOGIC A:  Leukocytosis, resolved - improved with transition in antibiotics -  leukocytosis improving (27.6>12.1>8.2) P:  - continue to trend with daily CBC with diff   ENDOCRINE A:  Hypothyroidism P:  continue home synthroid  per tube  A: Hyperglycemia, resolved - CBG's 60-90's overnight P: CBG q4 with SSI  A: Vomiting/Diarrhea: P: continue to monitor diarrhea Avoiding anti-metics with prolonged Qtc  NEUROLOGIC A:  Down Syndrome  Dementia  Intellectual Disability  - has significant anxiety with environmental change P:   - continue home Donepezil, Namenda, Trazodone - Delirium precautions  - frequent re-orientation and facilitation of calm environment  - non-pharmacologic interventions are preferred: enjoys children's books, attempt audible books  FAMILY  -  Sister [name] is healthcare power of attorney [phone]) or Darel Hong [phone] - Updates: GOC discussion on 10/18, patient made partial code with no CPR  - Continue GOC discussion - For now we will reintubate short term if needed. [name] is clear that we would not want long-term trach, PEG  Best practice:  Diet: EN per RDN  Pain/Anxiety/Delirium protocol (if indicated): prn fentanyl/ versed for RASS goal 0/-1 VAP protocol (if indicated): yes DVT prophylaxis: SQ heparin GI prophylaxis: PPI Glucose control: SSI sensitive/ q 4hr CBG Mobility: BR Code Status: full Family Communication: mother Lurlean Nanny is 69 and reportedly demented.  Sisters [name] [phone] or Darel Hong [phone] are primary contacts.  Linda called 10/18 Disposition: Remain in ICU    Orpah Cobb, DO [facility], PY[facility] 08/01/2019, 7:29 AM Pulmonary and Critical Care Medicine [facility] Pager: [phone]

## 2019-08-01 NOTE — Progress Notes (Signed)
[name] DOB: [birth year] MRN: [id number]   Date of Service  08/01/2019  HPI/Events of Note  Unable to mobilize respiratory secretions - Request for NT suctioning.   eICU Interventions  Will order NT suction PRN.      Intervention Category Major Interventions: Other:  Lysle Dingwall 08/01/2019, 11:16 PM

## 2019-08-01 NOTE — Progress Notes (Signed)
Goals of care discussion  Attendees:  Marni Griffon ACNP-BC Daughters Vaughan Basta and Blanch Media  Also the patients mother.   We discussed the following: hip fracture indications for surgery, risks of surgery, chances of recovery, and also code status both in regards to critical illness and cardiopulmonary arrest. At the request of the patient's sister Vaughan Basta I contacted the following providers: G Bowser, Carlyon Shadow and also discussed this case w/ P Mannam. In regards to surgery indication the primary indication for surgery in the immobile patient would be pain management. I called Ambrose Finland PA-C w/ orthopedics to verify this. Given Mr Sadowski already has a contracted RLE participation in appropriate weight bearing would be impossible should the goal be for him to be ambulatory.  It is the opinion of myself and the rest of the intensive care team mentioned above that Mr Eastham would NOT be a surgical candidate as currently his pain is managed and we believe that fixing his hip prothesis will not improve his functional outcome nor will it extend his life.   Based on my discussion with the daughters and mother goals of care are as follows:  1) full DNR 2) no intubation or ventilation 3) No BIPAP 4) no feeding tube 5) No central access 6) no pressors.   If her were to be acutely ill in the future they would agree to IV hydration and antibiotics but family would want to be notified early so that they could be available to be there with him during his final time should he be terminally ill.   My time 13 minutes   Erick Colace ACNP-BC [place] Pager # [phone] OR # [phone] if no answer

## 2019-08-01 NOTE — Progress Notes (Signed)
[facility] ADULT ICU REPLACEMENT PROTOCOL FOR AM LAB REPLACEMENT ONLY  The patient does apply for the [facility] Adult ICU Electrolyte Replacment Protocol based on the criteria listed below:   1. Is GFR >/= 40 ml/min? Yes.    Patient's GFR today is >60 2. Is urine output >/= 0.5 ml/kg/hr for the last 6 hours? Yes.   Patient's UOP is 2.6 ml/kg/hr 3. Is BUN < 60 mg/dL? Yes.    Patient's BUN today is 7 4. Abnormal electrolyte(s): k 3.3 5. Ordered repletion with: protocol 6. If a panic level lab has been reported, has the CCM MD in charge been notified? No..   Physician:    Ronda Fairly A 08/01/2019 6:34 AM

## 2019-08-01 NOTE — Progress Notes (Addendum)
    [facility] for Infectious Disease    Date of Admission:  07/28/2019   Total days of antibiotics 6  ID: Brian Stanton is a 58 y.o. male with downs syndrome with addn septic shock with acute hypoxic respiratory failure, mssa/group b strep bacteremia self extubated. Now on 5LNC, trach cx showing MSSA as well  Active Problems:   Acute respiratory failure (HCC)   Pressure injury of skin   Protein-calorie malnutrition, severe   Healthcare-associated pneumonia   Scrotal lesion   Bacteremia due to Staphylococcus aureus   Bacteremia due to Streptococcus   Goals of care, counseling/discussion    Subjective: Afebrile. Non-verbal. Wearing boxing mitts, throwing punches. Not answering questions/nodding hands.  ROS: unable to obtain due to cognitive impairment/non-verbal  Medications:  . Chlorhexidine Gluconate Cloth  6 each Topical Q0600  . collagenase   Topical Daily  . donepezil  5 mg Oral Daily  . heparin  5,000 Units Subcutaneous Q8H  . insulin aspart  0-9 Units Subcutaneous Q4H  . levothyroxine  88 mcg Oral Q0600  . memantine  20 mg Oral Daily  . pantoprazole sodium  40 mg Per Tube Daily  . traZODone  25 mg Per Tube QHS    Objective: Vital signs in last 24 hours: Temp:  [98.2 F (36.8 C)-98.3 F (36.8 C)] 98.3 F (36.8 C) (10/19 0400) Pulse Rate:  [64-87] 79 (10/19 1800) Resp:  [17-34] 25 (10/19 1800) BP: (92-146)/(50-78) 126/65 (10/19 1800) SpO2:  [89 %-100 %] 99 % (10/19 1800) Weight:  [59.4 kg] 59.4 kg (10/19 0402)  Physical Exam  Constitutional:  No distress.  HENT: poor dentition.  Mouth/Throat: Oropharynx is clear and moist. No oropharyngeal exudate. Wearing nasal cannula Pulmonary/Chest: Effort normal and breath sounds normal. No respiratory distress. He has no wheezes.  Skin: Skin is warm and dry. No rash noted. No erythema.  Psychiatric: combative   Lab Results Recent Labs    07/31/19 0601 08/01/19 0441  WBC 12.1* 8.2  HGB 9.3* 10.0*  HCT 28.3*  30.6*  NA 145 143  K 3.3* 3.3*  CL 110 109  CO2 27 28  BUN 10 7  CREATININE 0.74 0.74   Microbiology: Trach cx - mssa Blood cx - mssa and gbs ( 1 of 2 sets) Studies/Results: Dg Chest Port 1 View  Result Date: 07/30/2019 CLINICAL DATA:  ETT. EXAM: PORTABLE CHEST 1 VIEW COMPARISON:  July 30, 2019 FINDINGS: The ETT in left central line are in good position. An NG tube terminates below today's film. No pneumothorax. A layering right pleural effusion is identified, not seen previously. Pulmonary infiltrates, right greater than left, persist. Stable cardiomediastinal silhouette. No pneumothorax. IMPRESSION: 1. Support apparatus as above. Right-sided pleural effusion not seen previously. 2. Bilateral pulmonary infiltrates are worsened in the interval. Electronically Signed   By: Dorise Bullion III M.D   On: 07/30/2019 20:50   TTE - negative for vegetations Assessment/Plan: mssa pneumonia = continue with cefazolin 2gm IV q 8hr, would recommend to treat for a total of 14d  Transient mssa/gbs bacteremia = continue with cefazolin 2gm IV q 8hr.using 10/16 as day 1.  Scrotal abscess = continue with wound care recs  Will sign off.  Call if questions  [facility] for Infectious Diseases Cell: [phone] Pager: [phone]  08/01/2019, 6:04 PM

## 2019-08-01 NOTE — Progress Notes (Signed)
Pt having desaturations on 5L [place] with O2 sats down to low 80's. Attempted oral care and suctioning to get pt to cough and to help clear secretions. Also bed percussion attempted for airway clearance. Rhonchi bilateral breath sounds.   Pt placed on salter HFNC at 15 liters. Called and spoke to [facility], requesting an order to NT suction pt.  NT suction done. Will continue monitor.

## 2019-08-01 NOTE — Progress Notes (Signed)
Hypoglycemic Event  CBG: 66  Treatment: D50 25 mL (12.5 gm)  Symptoms: None  Follow-up CBG: Time: 2348 CBG Result: 140  Possible Reasons for Event: Unknown     Brian Stanton, Brian Stanton

## 2019-08-01 NOTE — Progress Notes (Signed)
NAME:  Bosten Newstrom, MRN:  [id number], DOB:  Sep 09, 1961, LOS: 4 ADMISSION DATE:  07/28/2019, CONSULTATION DATE:  07/28/2019 REFERRING MD:  Dr. Wilkie Aye, CHIEF COMPLAINT:  Respiratory failure  Brief History   12 yoM with hx of Down Syndrome presenting from rehab/ SNF with N/V/D found to have septic shock with aspiration pneumonia and requiring intubation for hypoxic respiratory failure.  Additionally has AKI and refracture of left hip nailing from recent hip surgery in September.    Past Medical History  Down Syndrome, intellectual disability, hypothyroidism, hip fracture, dysphagia,  chronic left UPJ obstruction  Recently hospitalized 9/22- 9/29 for bilateral hip fractures (left appearing more acute/ right more chronic) and underwent left Biomet now short trochanteric nail with interlocking and the right hip bipolar hemiarthroplasty done by Dr. [name] on 9/23. Additionally treated for possible RLL pneumonia with ceftriaxone and azithromycin.  He was discharged to SNF/ rehab.    Goes by [employer]"  He is nonverbal at baseline but can yell incoherently on occasional when he wants someone's attention.  Significant Hospital Events   10/15 Admitted 10/17-self extubated.  Did not require intubation 10/18 Transitioned to partial code  Consults:  Ortho  Procedures:  10/15 ETT >> 10/18 10/16 Lt IJ CVL >>  Significant Diagnostic Tests:  10/15 XR b/l hip >> 1.  Comminuted periprosthetic fracture/re-injury of the left femoral neck and intertrochanteric region with large effusion and overlying swelling. Transcervical fixation hardware stands proud of the cortex. 2.  Normal location of the right hip hemiarthroplasty with recent postsurgical changes. Alignment is suboptimally evaluated on these nondedicated images and given patient rotation.  Echo 10/17-LVEF 60 to 65%, moderate reduced RV systolic function. No obvious vegetation.  Micro Data:  10/15 SARS CoV2 >> neg 10/15 BC x 2 >> Staph Aureus,  Streptococcus agalactiae 10/15 UC >> 10/15 trach asp >>  Antimicrobials:  10/15 Vanco >> 10/17 10/15 Cefepime 10/15 zosyn 10/16 10/16 Unasyn >> 10/17  Cefazolin 10/17 >> Flagyl 10/17 >>  Interim history/subjective:  Required NRB at times overnight, stable on 5L [place] this AM.  Continues to protect airway.  Family discussion planned for today. Continues to decline SLP/swallow eval  Objective   Blood pressure (!) 105/57, pulse 70, temperature 98.3 F (36.8 C), temperature source Axillary, resp. rate (!) 29, height  (1.549 m), weight 59.4 kg, SpO2 96 %.       Intake/Output Summary (Last 24 hours) at 08/01/2019 1123 Last data filed at 08/01/2019 0600 Gross per 24 hour  Intake 1596.83 ml  Output 3450 ml  Net -1853.17 ml   Filed Weights   07/30/19 0430 07/31/19 0500 08/01/19 0402  Weight: 63.5 kg 63 kg 59.4 kg   Examination: Blood pressure 96/67, pulse 85, temperature 98.4 F (36.9 C), temperature source Axillary, resp. rate 17, height  (1.549 m), weight 63 kg, SpO2 94 %. Gen:       No acute distress HEENT:  EOMI, sclera anicteric Neck:     No masses; no thyromegaly Lungs:    Clear to auscultation bilaterally with normal work of breathing on 5L [place], some transmitted upper respiratory sounds throughout CV:         Regular rate and rhythm; no murmurs, mild LE edema Abd:        soft, non-tender, non-distended, normoactive bowel sounds  Ext:         No edema; adequate peripheral perfusion Skin:      Warm and dry; penis appears swollen but pick without any obvious skin  breakdown or discharge, however exam difficult due to hostility of patient Neuro: Awake, no focal deficits.   Resolved Hospital Problem list    Assessment & Plan:  A: Acute hypoxic respiratory failure requiring intubation, improving - in setting of aspiration PNA - possible component of atelectasis in setting of recent ortho surgeries - covid negative - Required transition to NRB 2/2 to hypoxia  overnight P:   Head of bed >30 degrees to reduce risk of aspiration Continue oxygen support Intermittent chest x-ray  A:  Polymicrobial Sepsis - MSSA and GBS bacteremia - TTE negative for vegetations  - Repeat blood cultures negative to date  P:   - Infectious disease consulted, appreciated recs - currently on Cefazolin and Flagyl (10/17-) - Antibiotics per ID - consider TEE given TTE unrevealing  - daily CBC with diff  A: Bilateral hip fractures s/p repair 07/06/19 - s/p recent fall last week per sister - Right femoral neck fracture s/p bipolar hemiarthroplasty with cemented stem - Left comminuted intertrochanteric hip fracture s/p biomet short trochanteric nail with interlock  P: - Ortho consulted, appreciate recs              - Recommend transfer to tertiary care once stable for management of complex hip fracture  A: Prolonged QTc  - QTc 432 on 10/17 P:  - avoid QT prolonging agents including antiemetics - monitor on tele  A:  AKI, resolved - History of urinary retention with chronic left UPJ obstruction - Creatinine back at baseline (1.46>>0.74) P:   - daily BMP - avoid nephrotoxic agents  - monitor urine output and creatinine  A:  Hypokalemia - Potassium 3.3 this AM P:  - replete as needed  A: Protein Calorie Malnutrition  Failture to Thrive: - currently NPO with D5 at 86mL/hr and electrolyte repletion PRN - continues to decline Swallow eval by SLP - aspiration risk P:  - GOC discussion planned for today (10/19) with Dr. Isaiah Serge, discuss diet goals - continue to attempt swallow study until further notice  - consider addition of TPN if remains NPO, likely will not tolerate NG tube  A:  Leukocytosis, resolved - improved with transition in antibiotics - leukocytosis improving (27.6>12.1>8.2) P:  - continue to trend with daily CBC with diff   A:  Hypothyroidism P:  continue home synthroid 88mg  per tube  A: Hyperglycemia, resolved - CBG's 60-90's overnight  P: CBG q4 with SSI  A: Vomiting/Diarrhea: P: continue to monitor diarrhea Avoiding anti-metics with prolonged Qtc  A:  Down Syndrome  Dementia  Intellectual Disability  - has significant anxiety with environmental change P:   - continue home Donepezil, Namenda, Trazodone - Delirium precautions  - frequent re-orientation and facilitation of calm environment  - non-pharmacologic interventions are preferred: enjoys children's books, attempt audible books  Goals of care - Sister is healthcare power of attorney [phone]) or [phone] Darel Hong - Updates: GOC discussion on 10/18, patient made partial code with no CPR             - Continue GOC discussion today 10/19 - For now we will reintubateshort termif needed. 11/19 is clearthat we would not want long-term trach, PEG   Best practice:  Diet: D5 at 70ml/hr, EN per RDN  Pain/Anxiety/Delirium protocol (if indicated): prn fentanyl/ versed for RASS goal 0/-1 VAP protocol (if indicated): yes DVT prophylaxis: SQ heparin GI prophylaxis: PPI Glucose control: SSI sensitive/ q 4hr CBG Mobility: BR Code Status: full Family Communication: mother 45m is 59 and reportedly  demented.  Sisters Bonita QuinLinda [phone] or Darel HongJudy [phone] are primary contacts.  Linda called 10/18 Disposition: Remain in ICU   Labs   CBC: Recent Labs  Lab 07/28/19 0301  07/29/19 0016 07/29/19 0235 07/30/19 0434 07/31/19 0601 08/01/19 0441  WBC 17.8*  --   --  24.5* 27.6* 12.1* 8.2  NEUTROABS 16.9*  --   --   --   --   --   --   HGB 13.3   < > 8.8* 9.2* 9.3* 9.3* 10.0*  HCT 43.4   < > 26.0* 29.1* 30.1* 28.3* 30.6*  MCV 104.8*  --   --  103.2* 102.7* 101.4* 97.8  PLT 488*  --   --  323 328 236 244   < > = values in this interval not displayed.    Basic Metabolic Panel: Recent Labs  Lab 07/28/19 0301  07/28/19 [id number] 07/29/19 0016 07/29/19 0235 07/30/19 0434 07/31/19 0601 08/01/19 0441  NA 141   < >  --  145 141 143 145 143  K 3.3*   <  >  --  3.6 3.3* 3.5 3.3* 3.3*  CL 99  --   --   --  113* 114* 110 109  CO2 27  --   --   --  24 21* 27 28  GLUCOSE 180*  --   --   --  112* 138* 96 102*  BUN 22*  --   --   --  12 12 10 7   CREATININE 1.46*  --   --   --  0.90 0.92 0.74 0.74  CALCIUM 7.9*  --   --   --  7.7* 7.5* 7.7* 8.0*  MG  --   --  2.2  --  1.8 2.1 1.9 2.0  PHOS  --   --  3.6  --  1.9* 1.6* 2.4* 2.2*   < > = values in this interval not displayed.   GFR: Estimated Creatinine Clearance: 73.5 mL/min (by C-G formula based on SCr of 0.74 mg/dL). Recent Labs  Lab 07/28/19 0301 07/28/19 [id number] 07/29/19 0235 07/30/19 0434 07/31/19 0601 08/01/19 0441  WBC 17.8*  --  24.5* 27.6* 12.1* 8.2  LATICACIDVEN 4.5* 5.9* 1.9  --   --   --     Liver Function Tests: Recent Labs  Lab 07/28/19 0301 07/29/19 0235  AST 31 19  ALT 14 9  ALKPHOS 98 76  BILITOT 0.6 1.1  PROT 5.7* 4.6*  ALBUMIN 2.0* 2.2*   Recent Labs  Lab 07/28/19 0301  LIPASE 46   No results for input(s): AMMONIA in the last 168 hours.  ABG    Component Value Date/Time   PHART 7.464 (H) 07/29/2019 0016   PCO2ART 30.8 (L) 07/29/2019 0016   PO2ART 58.0 (L) 07/29/2019 0016   HCO3 22.1 07/29/2019 0016   TCO2 23 07/29/2019 0016   ACIDBASEDEF 1.0 07/29/2019 0016   O2SAT 92.0 07/29/2019 0016     Coagulation Profile: Recent Labs  Lab 07/28/19 0301  INR 1.4*    Cardiac Enzymes: No results for input(s): CKTOTAL, CKMB, CKMBINDEX, TROPONINI in the last 168 hours.  HbA1C: Hgb A1c MFr Bld  Date/Time Value Ref Range Status  07/28/2019 06:38 AM 4.7 (L) 4.8 - 5.6 % Final    Comment:    (NOTE) Pre diabetes:          5.7%-6.4% Diabetes:              >6.4% Glycemic control for   <7.0%  adults with diabetes     CBG: Recent Labs  Lab 07/31/19 1505 07/31/19 1916 07/31/19 2301 08/01/19 0357 08/01/19 0747  GLUCAP 69* 95 105* 96 91   The patient is critically ill with multiple organ system failure and requires high complexity decision making for  assessment and support, frequent evaluation and titration of therapies, advanced monitoring, review of radiographic studies and interpretation of complex data.   Critical Care Time devoted to patient care services, exclusive of separately billable procedures, described in this note is 35 minutes.   Mina Marble, DO [facility], P[facility] 08/01/2019, 7:29 AM

## 2019-08-01 NOTE — Evaluation (Addendum)
Clinical/Bedside Swallow Evaluation Patient Details  Name: Brian Stanton MRN: [id number] Date of Birth: [birth year]  Today's Date: 08/01/2019 Time: SLP Start Time (ACUTE ONLY): 0743 SLP Stop Time (ACUTE ONLY): 0757 SLP Time Calculation (min) (ACUTE ONLY): 14 min  Past Medical History:  Past Medical History:  Diagnosis Date  . Down's syndrome   . GERD (gastroesophageal reflux disease)   . Mental retardation    Past Surgical History:  Past Surgical History:  Procedure Laterality Date  . HIP ARTHROPLASTY Right 07/06/2019   Procedure: ARTHROPLASTY BIPOLAR HIP (HEMIARTHROPLASTY);  Surgeon: Marybelle Killings, MD;  Location: [facility];  Service: Orthopedics;  Laterality: Right;  . INTRAMEDULLARY (IM) NAIL INTERTROCHANTERIC Left 07/06/2019   Procedure: INTRAMEDULLARY (IM) NAIL INTERTROCHANTRIC;  Surgeon: Marybelle Killings, MD;  Location: [facility];  Service: Orthopedics;  Laterality: Left;   HPI:  58 year old male with history of Down Syndrome, intellectual disability, hypothyroidism, hip fracture, dysphagia, and chronic left UPJ obstruction presenting from his SNF/ rehab for nausea, vomiting and diarrhea, dysphagia (MBS 2016). Per chart recent hospitalization 9/22- 9/29 for bilateral hip fractures (left appearing more acute/ right more chronic) and underwent surgery. Additionally treated for possible RLL pneumonia with ceftriaxone and azithromycin.  He was discharged to SNF/ rehab. Intubatd 10/15-10/17 (self extubated). CXR post intubation showing extensive right sided aspiration. Repeat CXR 10/17 showed Bilateral pulmonary infiltrates are worsened in the interval.   Assessment / Plan / Recommendation Clinical Impression  Pt presents with high risk of malnutrition and dehydration d/t inability to receive PO intake in setting of behaviors. SLP received pt upright in bed, alert with bilateral mittens in place. Pt's oral cavity was coated with dried secretions. SLP attempted oral care with pt swatting at writer's  hand. Pt allowed to hold toothette with mitten in place. Pt with no initiation to bring to mouth and rejected hand over hand support. To promote multi-sensory feedback, SLP presented applesauce container and spoon of applesauce. Pt swatted applesauce from SLP's hand x 2 and also attempted to swatt cup. Pt began yelling out and was not able to be redirected for any PO consumption. Education provided to pt's nurse on high risk of dehydration and malnutrition. Given lack of PO intake during BSE, unable to make safe diet recommendation at this time. ST to follow for PO readiness.    Continue strict oral care to reduce bacterial load and follow strict aspiration precautions such as having HOB elevated.   SLP Visit Diagnosis: Dysphagia, unspecified (R13.10)    Aspiration Risk  Risk for inadequate nutrition/hydration    Diet Recommendation NPO   Medication Administration: Via alternative means    Other  Recommendations Oral Care Recommendations: Oral care QID   Follow up Recommendations (Palliative Care consult appears appropriate)      Frequency and Duration min 2x/week  2 weeks       Prognosis Prognosis for Safe Diet Advancement: Guarded Barriers to Reach Goals: Cognitive deficits;Severity of deficits;Behavior      Swallow Study   General Date of Onset: 07/28/19 HPI: 58 year old male with history of Down Syndrome, intellectual disability, hypothyroidism, hip fracture, dysphagia, and chronic left UPJ obstruction presenting from his SNF/ rehab for nausea, vomiting and diarrhea, dysphagia (MBS 2016). Per chart recent hospitalization 9/22- 9/29 for bilateral hip fractures (left appearing more acute/ right more chronic) and underwent surgery. Additionally treated for possible RLL pneumonia with ceftriaxone and azithromycin.  He was discharged to SNF/ rehab. Intubatd 10/15-10/17 (self extubated). CXR post intubation showing extensive right sided aspiration. Repeat CXR  10/17 showed Bilateral  pulmonary infiltrates are worsened in the interval. Type of Study: Bedside Swallow Evaluation Previous Swallow Assessment: 06/2019 - BSE recommending dysphagia 1 with nectar thick liquids, thin water via tsp Diet Prior to this Study: NPO Temperature Spikes Noted: No Respiratory Status: Room air History of Recent Intubation: Yes Length of Intubations (days): 2 days Date extubated: 07/30/19(self-extubated) Behavior/Cognition: Alert;Uncooperative;Agitated;Doesn't follow directions Oral Cavity Assessment: Dried secretions Oral Care Completed by SLP: No Oral Cavity - Dentition: Missing dentition;Poor condition Patient Positioning: Upright in bed Baseline Vocal Quality: Normal(when yelling) Volitional Cough: Cognitively unable to elicit Volitional Swallow: Unable to elicit    Oral/Motor/Sensory Function     Ice Chips Ice chips: Not tested   Thin Liquid Thin Liquid: Not tested    Nectar Thick Nectar Thick Liquid: Not tested   Honey Thick Honey Thick Liquid: Not tested   Puree Puree: Not tested   Solid     Solid: Not tested      Kayia Billinger 08/01/2019,9:04 AM

## 2019-08-02 ENCOUNTER — Inpatient Hospital Stay (HOSPITAL_COMMUNITY): Payer: Medicare Other

## 2019-08-02 DIAGNOSIS — J189 Pneumonia, unspecified organism: Secondary | ICD-10-CM

## 2019-08-02 DIAGNOSIS — J1520 Pneumonia due to staphylococcus, unspecified: Secondary | ICD-10-CM

## 2019-08-02 DIAGNOSIS — B955 Unspecified streptococcus as the cause of diseases classified elsewhere: Secondary | ICD-10-CM

## 2019-08-02 DIAGNOSIS — E43 Unspecified severe protein-calorie malnutrition: Secondary | ICD-10-CM

## 2019-08-02 DIAGNOSIS — Z515 Encounter for palliative care: Secondary | ICD-10-CM

## 2019-08-02 DIAGNOSIS — N509 Disorder of male genital organs, unspecified: Secondary | ICD-10-CM

## 2019-08-02 LAB — CULTURE, BLOOD (ROUTINE X 2)
Culture: NO GROWTH
Special Requests: ADEQUATE

## 2019-08-02 LAB — BASIC METABOLIC PANEL
Anion gap: 11 (ref 5–15)
Anion gap: 10 (ref 5–15)
BUN: 6 mg/dL (ref 6–20)
BUN: 6 mg/dL (ref 6–20)
CO2: 26 mmol/L (ref 22–32)
CO2: 23 mmol/L (ref 22–32)
Calcium: 8 mg/dL — ABNORMAL LOW (ref 8.9–10.3)
Calcium: 7.9 mg/dL — ABNORMAL LOW (ref 8.9–10.3)
Chloride: 104 mmol/L (ref 98–111)
Chloride: 107 mmol/L (ref 98–111)
Creatinine, Ser: 0.71 mg/dL (ref 0.61–1.24)
Creatinine, Ser: 0.74 mg/dL (ref 0.61–1.24)
GFR calc Af Amer: >60 mL/min (ref >60)
GFR calc Af Amer: >60 mL/min (ref >60)
GFR calc non Af Amer: >60 mL/min (ref >60)
GFR calc non Af Amer: >60 mL/min (ref >60)
Glucose, Bld: 94 mg/dL (ref 70–99)
Glucose, Bld: 81 mg/dL (ref 70–99)
Potassium: 2.5 mmol/L — CL (ref 3.5–5.1)
Potassium: 3.5 mmol/L (ref 3.5–5.1)
Sodium: 141 mmol/L (ref 135–145)
Sodium: 140 mmol/L (ref 135–145)

## 2019-08-02 LAB — GLUCOSE, CAPILLARY
Glucose-Capillary: 87 mg/dL (ref 70–99)
Glucose-Capillary: 78 mg/dL (ref 70–99)
Glucose-Capillary: 77 mg/dL (ref 70–99)
Glucose-Capillary: 81 mg/dL (ref 70–99)
Glucose-Capillary: 72 mg/dL (ref 70–99)
Glucose-Capillary: 88 mg/dL (ref 70–99)

## 2019-08-02 LAB — CULTURE, RESPIRATORY W GRAM STAIN: Gram Stain: NONE SEEN

## 2019-08-02 MED ORDER — IPRATROPIUM-ALBUTEROL 0.5-2.5 (3) MG/3ML IN SOLN
3.0000 mL | Freq: Three times a day (TID) | RESPIRATORY_TRACT | Status: DC
  Administered 2019-08-02 – 2019-08-04 (×6): 3 mL via RESPIRATORY_TRACT
  Filled 2019-08-02 (×9): qty 3

## 2019-08-02 MED ORDER — POTASSIUM CHLORIDE 10 MEQ/100ML IV SOLN
10.0000 meq | INTRAVENOUS | Status: AC
  Administered 2019-08-02 (×10): 10 meq via INTRAVENOUS
  Filled 2019-08-02 (×10): qty 100

## 2019-08-02 MED ORDER — LORAZEPAM 2 MG/ML IJ SOLN
0.5000 mg | INTRAMUSCULAR | Status: DC | PRN
  Administered 2019-08-03: 0.5 mg via INTRAVENOUS
  Filled 2019-08-02: qty 1

## 2019-08-02 MED ORDER — IPRATROPIUM-ALBUTEROL 0.5-2.5 (3) MG/3ML IN SOLN: 3.0000 mL | RESPIRATORY_TRACT | Status: DC

## 2019-08-02 NOTE — Progress Notes (Signed)
[facility] ADULT ICU REPLACEMENT PROTOCOL FOR AM LAB REPLACEMENT ONLY  The patient does apply for the [facility] Adult ICU Electrolyte Replacment Protocol based on the criteria listed below:   1. Is GFR >/= 40 ml/min? Yes.    Patient's GFR today is >60 2. Is urine output >/= 0.5 ml/kg/hr for the last 6 hours? Yes.   Patient's UOP is 3.2 ml/kg/hr 3. Is BUN < 60 mg/dL? Yes.    Patient's BUN today is 6 4. Abnormal electrolyte(s): k 2.5 5. Ordered repletion with: protocol 6. If a panic level lab has been reported, has the CCM MD in charge been notified? No..   Physician:    Ronda Fairly A 08/02/2019 3:45 AM

## 2019-08-02 NOTE — Progress Notes (Signed)
CRITICAL VALUE ALERT  Critical Value:  K 2.5  Date & Time Notied:  08/02/2019 @ 0307  Provider Notified: Warren Lacy MD  Orders Received/Actions taken: new orders

## 2019-08-02 NOTE — Progress Notes (Signed)
[facility] Progress Note Patient Name: Brian Stanton DOB: 16-Nov-1960 MRN: [id number]   Date of Service  08/02/2019  HPI/Events of Note  Hypoxia - Sat = 85% on 15 L/min high flow oxygen. Nurse has NT suctioned and done CPT without improvement. Patient is DNR/DNI and family does not want BiPAP as he would not tolerate it d/t Hx of Down's Syndrome.   eICU Interventions  Will order: 1. Portable CXR STAT, 2. Will notify ground team.      Intervention Category Major Interventions: Hypoxemia - evaluation and management  Malaya Cagley Eugene 08/02/2019, 12:43 AM

## 2019-08-02 NOTE — Progress Notes (Signed)
Pt still having trouble maintaining O2 saturations and mobilizing secretions, despite bed chest PT, NT suction and PRN breathing treatment. O2 sats anywhere from 80-88%. Spoke again to [facility] MD about concerns and other options.   Stat chest xray. Will continue to monitor.

## 2019-08-02 NOTE — Consult Note (Signed)
Consultation Note Date: 08/02/2019   Patient Name: Brian Stanton  DOB: [birth year]  MRN: [id number]  Age / Sex: 58 y.o., male  PCP: Default, Provider, MD Referring Physician: Joycelyn Das, MD  Reason for Consultation: Establishing goals of care  HPI/Patient Profile: 58 y.o. male  with past medical history of Down syndrome, dementia, dysphagia, hypothyroidism, and hip fracture admitted on 07/28/2019 with N/V/D. Found to have septic shock with aspiration pneumonia and required intubation. He was recently hospitalized 9/22-9/29 for bilateral hip fractures and required surgery. 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  Patient now extubated, but requiring 15 L oxygen with HFNC. Desturations overnight requiring nasotracheal suctioning. Per RN, he desaturates with any stimulation. He is also refusing all PO intake including medications.   Per RN, does not appear to have pain. Mostly sleeps unless someone is in the room to provide care. With any intervention, he becomes very anxious/agitated/aggressive with staff.   PMT consulted for GOC and symptoms.   Clinical Assessment and Goals of Care: I have reviewed medical records including EPIC notes, labs and imaging, and received report from RN.  I then spoke with patient's sisters, Brian Stanton and Brian Stanton,   to discuss diagnosis prognosis, GOC, EOL wishes, disposition and options.  I introduced Palliative Medicine as specialized medical care for people living with serious illness. It focuses on providing relief from the symptoms and stress of a serious illness. The goal is to improve quality of life for both the patient and the family.  We discussed a brief life review of the patient. They share that patient lived at home with mother and father for 50 years. For the last ten years he has lived in a group home and had a great experience. Has only recently started to decline following hip fractures. They tell me that  throughout his life he has always been very sweet and compliant.   As far as functional and nutritional status, they share about nonambulatory status since fractures. They also feel that he has lost some weight. Agitation and combativeness is new symptom for him.    We discussed his current illness and what it means in the larger context of his on-going co-morbidities.  Natural disease trajectory and expectations at EOL were discussed. Discussed continued high oxygen needs. Discussed concerns about patient refusing all PO intake.   The difference between aggressive medical intervention and comfort care was considered in light of the patient's goals of care. One sister, Brian Stanton, is patient's guardian and is unsure how to move forward with care. She would like to see how patient does over the next few days and feels that he will start to eat on his own. The other sister, Brian Stanton, is a retired Charity fundraiser. She seems to think it may be more appropriate to shift care to focus on Brian Stanton comfort; however, she wants to speak further about this with her sister and see how patient is doing tomorrow. Discussed assessing when medical interventions may be causing more suffering than helping.   We discussed patient's significant agitation and anxiety - they are agreeable to low dose ativan PRN.   Decisions for DNR, no bipap, and no feeding tube have been made and are firm.   Hospice and Palliative Care services outpatient were explained and offered. Will continue discussions about these options as we move forward.   Questions and concerns were addressed. The family was encouraged to call with questions or concerns.   Primary Decision Maker NEXT OF KIN - sister Brian Stanton is  primary decision maker although seems to make decisions jointly with other sister - patient's mother is living and participates in decision making as well, but per daughters, she is 59 years old and has some dementia     SUMMARY OF RECOMMENDATIONS    Initial discussion with palliative care - discussed situation and options moving forward - one sister interested in a shift to full comfort but other sister unsure -they plan to have a discussion tonight - will follow up tomorrow to reevaluate patient condition and discuss GOC with family - if continues to refuse all intake, likely hospice facility appropriate if in line with family's wishes  Code Status/Advance Care Planning:  DNR   Symptom Management:   Added PRN ativan for patient's anxiety/agitation - family agreeable - discussed use with RN  Additional Recommendations (Limitations, Scope, Preferences):  No Artificial Feeding and No Tracheostomy  Psycho-social/Spiritual:   Desire for further Chaplaincy support:no  Additional Recommendations: Education on Hospice  Prognosis:   Unable to determine  Discharge Planning: To Be Determined      Primary Diagnoses: Present on Admission: **None**   I have reviewed the medical record, interviewed the patient and family, and examined the patient. The following aspects are pertinent.  Past Medical History:  Diagnosis Date  . Down's syndrome   . GERD (gastroesophageal reflux disease)   . Mental retardation    Social History   Socioeconomic History  . Marital status: Single    Spouse name: Not on file  . Number of children: Not on file  . Years of education: Not on file  . Highest education level: Not on file  Occupational History  . Not on file  Social Needs  . Financial resource strain: Not on file  . Food insecurity    Worry: Not on file    Inability: Not on file  . Transportation needs    Medical: Not on file    Non-medical: Not on file  Tobacco Use  . Smoking status: Never Smoker  . Smokeless tobacco: Never Used  Substance and Sexual Activity  . Alcohol use: No  . Drug use: No  . Sexual activity: Not on file  Lifestyle  . Physical activity    Days per week: Not on file    Minutes per session: Not on  file  . Stress: Not on file  Relationships  . Social Musicianconnections    Talks on phone: Not on file    Gets together: Not on file    Attends religious service: Not on file    Active member of club or organization: Not on file    Attends meetings of clubs or organizations: Not on file    Relationship status: Not on file  Other Topics Concern  . Not on file  Social History Narrative  . Not on file   Family History  Problem Relation Age of Onset  . Diabetes Mellitus II Neg Hx    Scheduled Meds: . Chlorhexidine Gluconate Cloth  6 each Topical Q0600  . collagenase   Topical Daily  . donepezil  5 mg Oral Daily  . heparin  5,000 Units Subcutaneous Q8H  . insulin aspart  0-9 Units Subcutaneous Q4H  . ipratropium-albuterol  3 mL Nebulization TID  . levothyroxine  88 mcg Oral Q0600  . memantine  20 mg Oral Daily  . pantoprazole sodium  40 mg Per Tube Daily  . traZODone  25 mg Per Tube QHS   Continuous Infusions: . sodium chloride Stopped (07/29/19  2440)  .  ceFAZolin (ANCEF) IV 2 g (08/02/19 1414)  . dextrose 50 mL/hr at 08/02/19 1100   PRN Meds:.acetaminophen (TYLENOL) oral liquid 160 mg/5 mL, artificial tears, bisacodyl, docusate, haloperidol lactate, LORazepam, oxybutynin No Known Allergies  Vital Signs: BP 100/60   Pulse 75   Temp 97.8 F (36.6 C) (Oral)   Resp (!) 28   Ht 5\' 1"  (1.549 m)   Wt 59.4 kg   SpO2 93%   BMI 24.74 kg/m  Pain Scale: PAINAD       SpO2: SpO2: 93 % O2 Device:SpO2: 93 % O2 Flow Rate: .O2 Flow Rate (L/min): (S) 15 L/min  IO: Intake/output summary:   Intake/Output Summary (Last 24 hours) at 08/02/2019 1552 Last data filed at 08/02/2019 1100 Gross per 24 hour  Intake 2545.04 ml  Output 6300 ml  Net -3754.96 ml    LBM: Last BM Date: 07/31/19 Baseline Weight: Weight: 54.4 kg Most recent weight: Weight: 59.4 kg     Palliative Assessment/Data: PPS 20%    The above conversation was completed via telephone due to the visitor restrictions  during the COVID-19 pandemic. Thorough chart review and discussion with necessary members of the care team was completed as part of assessment. All issues were discussed and addressed but no physical exam was performed.  Time Total: 70 minutes Greater than 50%  of this time was spent counseling and coordinating care related to the above assessment and plan.  Juel Burrow, DNP, AGNP-C Palliative Medicine Team [phone] Pager: [phone]

## 2019-08-02 NOTE — Progress Notes (Addendum)
8:30am: Juliann Pulse at [facility] reports that this patient is "difficult to treat" due to him constantly yelling out and lashing out at staff.   7:30am: CSW received message from [place] at [facility] who states this patient is not appropriate for the facility. CSW will inquire with staff for further clarification.  Madilyn Fireman, MSW, LCSW-A Transitions of Care  Clinical Social Worker  [facility]  Medical ICU [phone]

## 2019-08-02 NOTE — Progress Notes (Signed)
SLP Cancellation Note  Patient Details Name: Maleik Vanderzee MRN: [id number] DOB: 1961/09/20   Cancelled treatment:       Reason Eval/Treat Not Completed: (see notes)   This Probation officer spoke with pt's nurse. She advises that pt is now DNR and on a diet (dysphagia 1 with thin liquids). He is comfort only and largely refuses any PO intake or oral care. ST services are no longer appropriate. ST to sign off.    Jaselyn Nahm 08/02/2019, 8:43 AM

## 2019-08-02 NOTE — Progress Notes (Signed)
PROGRESS NOTE  Brian Stanton GQQ:[id number] DOB: 1960-11-01 DOA: 07/28/2019 PCP: Default, Provider, MD   LOS: 5 days   Brief narrative:  45 yoM with hx of Down Syndrome presenting from rehab/ SNF with N/V/D found to have septic shock with aspiration pneumonia and requiring intubation for hypoxic respiratory failure.  Additionally, he had AKI and refracture of left hip nailing from recent hip surgery in September. Recently hospitalized 9/22- 9/29 for bilateral hip fractures (left appearing more acute/ right more chronic) and underwent left Biomet now short trochanteric nail with interlocking and the right hip bipolar hemiarthroplasty done by Dr. [name] on 9/23. Additionally, treated for possible RLL pneumonia with ceftriaxone and azithromycin.  He was discharged to SNF/ rehab.  He is nonverbal at baseline, but can yell incoherently on occasional when he wants someone's attention.  Transferred to medical service from ICU on 08/02/2019.  Assessment/Plan:  Active Problems:   Acute respiratory failure (HCC)   Pressure injury of skin   Protein-calorie malnutrition, severe   Healthcare-associated pneumonia   Scrotal lesion   Bacteremia due to Staphylococcus aureus   Bacteremia due to Streptococcus   Goals of care, counseling/discussion   Staphylococcal pneumonia (HCC)  Acute respiratory failure, aspiration- staph pneumonia Stable post extubation, wean down O2 as tolerated.  On nasal cannula at this time.  Continue IV antibiotics at this time.  Patient's family does not wish aggressive measures including BiPAP placement. patient is DNR.  Afebrile at this time.  On dysphagia 1 diet.  ID recommends a total of 14 days of cefazolin.  MSSA, group B strep bacteremia infectious disease on board, Continue cefazolin.  Goals of care discussion Code status is full DNR, no aggressive care.  Will consult palliative care to assist with his pain anxiety panic episodes poor oral intake and worsening clinical  status.  Agitation, combativeness refusal to eat.  Will get palliative care.  Would benefit from continued goals of care  Hypokalemia. Will replenish through IV.  Check BMP in a.m.  Scrotal abscess.  Continue current antibiotic.  Continue wound care.  VTE Prophylaxis: Heparin subcu  Code Status: DNR  Family Communication: I spoke with the patient's Sister [name] on the phone and updated her about the clinical condition of the patient.  Disposition Plan: Unknown at this time.  Social worker on board.  MICRO Data 10/15 SARS CoV2 >> neg 10/15 BC x 2 >> Staph Aureus, Streptococcus agalactiae 10/15 UC >> 10/15 trach asp >>  Consultants: Ortho  Procedures: 10/17-self extubated.  Did not require reintubation 10/15 ETT >> 10/18 10/16 Lt IJ CVL >>  Antibiotics:  10/15 Vanco >> 10/17 10/15 Cefepime 10/15 zosyn 10/16 10/16 Unasyn >> 10/17  Cefazolin 10/17 >> Flagyl 10/17 >>  Anti-infectives (From admission, onward)   Start     Dose/Rate Route Frequency Ordered Stop   07/30/19 1515  ceFAZolin (ANCEF) IVPB 2g/100 mL premix     2 g 200 mL/hr over 30 Minutes Intravenous Every 8 hours 07/30/19 1505     07/30/19 1515  metroNIDAZOLE (FLAGYL) IVPB 500 mg  Status:  Discontinued     500 mg 100 mL/hr over 60 Minutes Intravenous Every 8 hours 07/30/19 1505 08/01/19 1804   07/29/19 1800  Ampicillin-Sulbactam (UNASYN) 3 g in sodium chloride 0.9 % 100 mL IVPB  Status:  Discontinued     3 g 200 mL/hr over 30 Minutes Intravenous Every 6 hours 07/29/19 1125 07/30/19 1505   07/29/19 1200  ceFEPIme (MAXIPIME) 2 g in sodium chloride 0.9 % 100  mL IVPB  Status:  Discontinued     2 g 200 mL/hr over 30 Minutes Intravenous Every 8 hours 07/29/19 0836 07/29/19 1125   07/29/19 0930  vancomycin (VANCOCIN) 1,250 mg in sodium chloride 0.9 % 250 mL IVPB  Status:  Discontinued     1,250 mg 166.7 mL/hr over 90 Minutes Intravenous Every 24 hours 07/29/19 0840 07/30/19 1505   07/28/19 2200   piperacillin-tazobactam (ZOSYN) IVPB 3.375 g  Status:  Discontinued     3.375 g 12.5 mL/hr over 240 Minutes Intravenous Every 8 hours 07/28/19 0528 07/28/19 0841   07/28/19 1600  ceFEPIme (MAXIPIME) 2 g in sodium chloride 0.9 % 100 mL IVPB  Status:  Discontinued     2 g 200 mL/hr over 30 Minutes Intravenous Every 12 hours 07/28/19 0841 07/29/19 0836   07/28/19 0527  vancomycin variable dose per unstable renal function (pharmacist dosing)  Status:  Discontinued      Does not apply See admin instructions 07/28/19 0528 07/28/19 1017   07/28/19 0430  ampicillin-sulbactam (UNASYN) 1.5 g in sodium chloride 0.9 % 100 mL IVPB  Status:  Discontinued     1.5 g 200 mL/hr over 30 Minutes Intravenous  Once 07/28/19 0421 07/28/19 0516   07/28/19 0245  vancomycin (VANCOCIN) IVPB 1000 mg/200 mL premix     1,000 mg 200 mL/hr over 60 Minutes Intravenous  Once 07/28/19 0240 07/28/19 0435   07/28/19 0245  ceFEPIme (MAXIPIME) 2 g in sodium chloride 0.9 % 100 mL IVPB     2 g 200 mL/hr over 30 Minutes Intravenous  Once 07/28/19 0240 07/28/19 0403     Subjective: Nursing staff reported that patient has been refusing any food and drinks and has been agitated, constantly yelling out and lashing out at the staff.  Objective: Vitals:   08/02/19 0600 08/02/19 0700  BP:    Pulse: 71 77  Resp: (!) 37 (!) 36  Temp:    SpO2: 93% 94%    Intake/Output Summary (Last 24 hours) at 08/02/2019 0734 Last data filed at 08/02/2019 0700 Gross per 24 hour  Intake 1959.65 ml  Output 4850 ml  Net -2890.35 ml   Filed Weights   07/31/19 0500 08/01/19 0402 08/02/19 0312  Weight: 63 kg 59.4 kg 59.4 kg   Body mass index is 24.74 kg/m.   Physical Exam: GENERAL: Patient agitated, tachypneic and anxious, on nasal cannula.  Thinly built HENT: No scleral pallor or icterus. Pupils equally reactive to light. Oral mucosa is moist NECK: is supple, no palpable thyroid enlargement. CHEST: Coarse breath sounds present  bilaterally.  Appears to be agitated when trying to auscultate. CVS: S1 and S2 heard, no murmur. Regular rate and rhythm. No pericardial rub. ABDOMEN: Soft, non-tender, scrotal swelling noted.  Foley catheter in place. EXTREMITIES: No edema. CNS: Alert awake anxious and moving all extremities. SKIN: warm and dry   Data Review: I have personally reviewed the following laboratory data and studies,  CBC: Recent Labs  Lab 07/28/19 0301  07/29/19 0016 07/29/19 0235 07/30/19 0434 07/31/19 0601 08/01/19 0441  WBC 17.8*  --   --  24.5* 27.6* 12.1* 8.2  NEUTROABS 16.9*  --   --   --   --   --   --   HGB 13.3   < > 8.8* 9.2* 9.3* 9.3* 10.0*  HCT 43.4   < > 26.0* 29.1* 30.1* 28.3* 30.6*  MCV 104.8*  --   --  103.2* 102.7* 101.4* 97.8  PLT 488*  --   --  323 328 236 244   < > = values in this interval not displayed.   Basic Metabolic Panel: Recent Labs  Lab 07/28/19 0638  07/29/19 0235 07/30/19 0434 07/31/19 0601 08/01/19 0441 08/02/19 0205  NA  --    < > 141 143 145 143 141  K  --    < > 3.3* 3.5 3.3* 3.3* 2.5*  CL  --   --  113* 114* 110 109 104  CO2  --   --  24 21* GLUCOSE  --   --  112* 138* 96 102* 94  BUN  --   --  CREATININE  --   --  0.90 0.92 0.74 0.74 0.71  CALCIUM  --   --  7.7* 7.5* 7.7* 8.0* 8.0*  MG 2.2  --  1.8 2.1 1.9 2.0  --   PHOS 3.6  --  1.9* 1.6* 2.4* 2.2*  --    < > = values in this interval not displayed.   Liver Function Tests: Recent Labs  Lab 07/28/19 0301 07/29/19 0235  AST 31 19  ALT 14 9  ALKPHOS 98 76  BILITOT 0.6 1.1  PROT 5.7* 4.6*  ALBUMIN 2.0* 2.2*   Recent Labs  Lab 07/28/19 0301  LIPASE 46   No results for input(s): AMMONIA in the last 168 hours. Cardiac Enzymes: No results for input(s): CKTOTAL, CKMB, CKMBINDEX, TROPONINI in the last 168 hours. BNP (last 3 results) No results for input(s): BNP in the last 8760 hours.  ProBNP (last 3 results) No results for input(s): PROBNP in the last 8760 hours.   CBG: Recent Labs  Lab 08/01/19 1928 08/01/19 2322 08/01/19 2348 08/02/19 0302 08/02/19 0725  GLUCAP 84 66* 140* 87 78   Recent Results (from the past 240 hour(s))  Blood Culture (routine x 2)     Status: Abnormal   Collection Time: 07/28/19  2:52 AM   Specimen: BLOOD  Result Value Ref Range Status   Specimen Description BLOOD LEFT ANTECUBITAL  Final   Special Requests   Final    BOTTLES DRAWN AEROBIC AND ANAEROBIC Blood Culture adequate volume   Culture  Setup Time   Final    IN BOTH AEROBIC AND ANAEROBIC BOTTLES GRAM POSITIVE COCCI CRITICAL RESULT CALLED TO, READ BACK BY AND VERIFIED WITH: Lytle But[facility] 07/28/19 1746 JDW Performed at [facility], [address] [place], [place] [address]    Culture STAPHYLOCOCCUS AUREUS STREPTOCOCCUS AGALACTIAE  (A)  Final   Report Status 07/30/2019 FINAL  Final   Organism ID, Bacteria STAPHYLOCOCCUS AUREUS  Final   Organism ID, Bacteria STREPTOCOCCUS AGALACTIAE  Final      Susceptibility   Streptococcus agalactiae - MIC*    CLINDAMYCIN >=1 RESISTANT Resistant     AMPICILLIN <=0.25 SENSITIVE Sensitive     ERYTHROMYCIN >=8 RESISTANT Resistant     VANCOMYCIN 0.5 SENSITIVE Sensitive     CEFTRIAXONE <=0.12 SENSITIVE Sensitive     LEVOFLOXACIN 1 SENSITIVE Sensitive     * STREPTOCOCCUS AGALACTIAE   Staphylococcus aureus - MIC*    CIPROFLOXACIN <=0.5 SENSITIVE Sensitive     ERYTHROMYCIN >=8 RESISTANT Resistant     GENTAMICIN <=0.5 SENSITIVE Sensitive     OXACILLIN 0.5 SENSITIVE Sensitive     TETRACYCLINE <=1 SENSITIVE Sensitive     VANCOMYCIN <=0.5 SENSITIVE Sensitive     TRIMETH/SULFA <=10 SENSITIVE Sensitive     CLINDAMYCIN RESISTANT Resistant  RIFAMPIN <=0.5 SENSITIVE Sensitive     Inducible Clindamycin POSITIVE Resistant     * STAPHYLOCOCCUS AUREUS  Blood Culture (routine x 2)     Status: None (Preliminary result)   Collection Time: 07/28/19  3:05 AM   Specimen: BLOOD LEFT HAND  Result Value Ref Range Status    Specimen Description BLOOD LEFT HAND  Final   Special Requests   Final    BOTTLES DRAWN AEROBIC AND ANAEROBIC Blood Culture adequate volume   Culture   Final    NO GROWTH 4 DAYS Performed at [facility], [address], [place], [place] [address]    Report Status PENDING  Incomplete  SARS Coronavirus 2 by RT PCR (hospital order, performed in [facility] hospital lab) Nasopharyngeal Nasopharyngeal Swab     Status: None   Collection Time: 07/28/19  3:28 AM   Specimen: Nasopharyngeal Swab  Result Value Ref Range Status   SARS Coronavirus 2 NEGATIVE NEGATIVE Final    Comment: (NOTE) If result is NEGATIVE SARS-CoV-2 target nucleic acids are NOT DETECTED. The SARS-CoV-2 RNA is generally detectable in upper and lower  respiratory specimens during the acute phase of infection. The lowest  concentration of SARS-CoV-2 viral copies this assay can detect is 250  copies / mL. A negative result does not preclude SARS-CoV-2 infection  and should not be used as the sole basis for treatment or other  patient management decisions.  A negative result may occur with  improper specimen collection / handling, submission of specimen other  than nasopharyngeal swab, presence of viral mutation(s) within the  areas targeted by this assay, and inadequate number of viral copies  (<250 copies / mL). A negative result must be combined with clinical  observations, patient history, and epidemiological information. If result is POSITIVE SARS-CoV-2 target nucleic acids are DETECTED. The SARS-CoV-2 RNA is generally detectable in upper and lower  respiratory specimens dur ing the acute phase of infection.  Positive  results are indicative of active infection with SARS-CoV-2.  Clinical  correlation with patient history and other diagnostic information is  necessary to determine patient infection status.  Positive results do  not rule out bacterial infection or co-infection with other viruses. If result is  PRESUMPTIVE POSTIVE SARS-CoV-2 nucleic acids MAY BE PRESENT.   A presumptive positive result was obtained on the submitted specimen  and confirmed on repeat testing.  While 2019 novel coronavirus  (SARS-CoV-2) nucleic acids may be present in the submitted sample  additional confirmatory testing may be necessary for epidemiological  and / or clinical management purposes  to differentiate between  SARS-CoV-2 and other Sarbecovirus currently known to infect humans.  If clinically indicated additional testing with an alternate test  methodology [phone]) is advised. The SARS-CoV-2 RNA is generally  detectable in upper and lower respiratory sp ecimens during the acute  phase of infection. The expected result is Negative. Fact Sheet for Patients:  StrictlyIdeas.no Fact Sheet for Healthcare Providers: BankingDealers.co.za This test is not yet approved or cleared by the [place] FDA and has been authorized for detection and/or diagnosis of SARS-CoV-2 by FDA under an Emergency Use Authorization (EUA).  This EUA will remain in effect (meaning this test can be used) for the duration of the COVID-19 declaration under Section 564(b)(1) of the Act, 21 U.S.C. section 360bbb-3(b)(1), unless the authorization is terminated or revoked sooner. Performed at [facility], [place] [address], [place], [place] [address]   MRSA PCR Screening  Status: None   Collection Time: 07/28/19  7:25 AM   Specimen: Nasopharyngeal  Result Value Ref Range Status   MRSA by PCR NEGATIVE NEGATIVE Final    Comment:        The GeneXpert MRSA Assay (FDA approved for NASAL specimens only), is one component of a comprehensive MRSA colonization surveillance program. It is not intended to diagnose MRSA infection nor to guide or monitor treatment for MRSA infections. Performed at [facility], [address] [place], [place] [address]   Urine culture      Status: None   Collection Time: 07/30/19  9:24 AM   Specimen: In/Out Cath Urine  Result Value Ref Range Status   Specimen Description IN/OUT CATH URINE  Final   Special Requests NONE  Final   Culture   Final    NO GROWTH Performed at [facility], [address] [place], [place] [address]    Report Status 07/31/2019 FINAL  Final  Culture, blood (Routine X 2) w Reflex to ID Panel     Status: None (Preliminary result)   Collection Time: 07/30/19  9:50 AM   Specimen: BLOOD RIGHT HAND  Result Value Ref Range Status   Specimen Description BLOOD RIGHT HAND  Final   Special Requests   Final    BOTTLES DRAWN AEROBIC ONLY Blood Culture adequate volume   Culture   Final    NO GROWTH 2 DAYS Performed at [facility], [address], [place], [place] [address]    Report Status PENDING  Incomplete  Culture, blood (Routine X 2) w Reflex to ID Panel     Status: None (Preliminary result)   Collection Time: 07/30/19 10:00 AM   Specimen: BLOOD LEFT HAND  Result Value Ref Range Status   Specimen Description BLOOD LEFT HAND  Final   Special Requests   Final    BOTTLES DRAWN AEROBIC ONLY Blood Culture results may not be optimal due to an inadequate volume of blood received in culture bottles   Culture   Final    NO GROWTH 2 DAYS Performed at [facility], [address], [place], [place] [address]    Report Status PENDING  Incomplete  Culture, respiratory (tracheal aspirate)     Status: None (Preliminary result)   Collection Time: 07/30/19 11:21 AM   Specimen: Tracheal Aspirate; Respiratory  Result Value Ref Range Status   Specimen Description TRACHEAL ASPIRATE  Final   Special Requests NONE  Final   Gram Stain NO WBC SEEN NO ORGANISMS SEEN   Final   Culture FEW STAPHYLOCOCCUS AUREUS  Final   Report Status PENDING  Incomplete   Organism ID, Bacteria STAPHYLOCOCCUS AUREUS  Final      Susceptibility   Staphylococcus aureus - MIC*    CIPROFLOXACIN <=0.5 SENSITIVE Sensitive      ERYTHROMYCIN >=8 RESISTANT Resistant     GENTAMICIN <=0.5 SENSITIVE Sensitive     OXACILLIN 0.5 SENSITIVE Sensitive     TETRACYCLINE <=1 SENSITIVE Sensitive     VANCOMYCIN 1 SENSITIVE Sensitive     TRIMETH/SULFA <=10 SENSITIVE Sensitive     CLINDAMYCIN RESISTANT Resistant     RIFAMPIN <=0.5 SENSITIVE Sensitive     Inducible Clindamycin Value in next row Resistant      POSITIVEPerformed at [facility], [address], [place], [place] [address]    * FEW STAPHYLOCOCCUS AUREUS     Studies: Dg Chest Port 1 View  Result Date: 08/02/2019 CLINICAL  DATA:  Hypoxia. EXAM: PORTABLE CHEST 1 VIEW COMPARISON:  07/30/2019 FINDINGS: Endotracheal tube, enteric tube, and left central line have been removed. Slight progression in diffuse airspace disease throughout the right lung in the central and lower lung zone predominant distribution. Right pleural effusion grossly unchanged. Hazy opacity at the left lung base likely combination of pleural fluid and airspace disease/atelectasis. Unchanged heart size and mediastinal contours. Calcified mediastinal and left hilar lymph nodes. No visualized pneumothorax. IMPRESSION: 1. Slight progression in diffuse airspace disease throughout the right lung. 2. Unchanged bilateral pleural effusions and left lung base airspace disease/atelectasis. Electronically Signed   By: Narda RutherfordMelanie  Sanford M.D.   On: 08/02/2019 01:18   10/15 XR b/l hip >> 1.  Comminuted periprosthetic fracture/re-injury of the left femoral neck and intertrochanteric region with large effusion and overlying swelling. Transcervical fixation hardware stands proud of the cortex. 2.  Normal location of the right hip hemiarthroplasty with recent postsurgical changes. Alignment is suboptimally evaluated on these nondedicated images and given patient rotation.  Echo 10/17-LVEF 60 to 65%, moderate reduced RV systolic function. No obvious vegetation.   Scheduled Meds: . Chlorhexidine Gluconate Cloth   6 each Topical Q0600  . collagenase   Topical Daily  . donepezil  5 mg Oral Daily  . heparin  5,000 Units Subcutaneous Q8H  . insulin aspart  0-9 Units Subcutaneous Q4H  . levothyroxine  88 mcg Oral Q0600  . memantine  20 mg Oral Daily  . pantoprazole sodium  40 mg Per Tube Daily  . traZODone  25 mg Per Tube QHS    Continuous Infusions: . sodium chloride Stopped (07/29/19 0622)  .  ceFAZolin (ANCEF) IV Stopped (08/02/19 0532)  . dextrose 50 mL/hr at 08/02/19 0700  . potassium chloride 100 mL/hr at 08/02/19 0700     Brian DasLaxman Lorie Cleckley, MD  [facility] 08/02/2019

## 2019-08-02 NOTE — Progress Notes (Signed)
This RN has personally tried to feed the patient three different meals now. Patient is refusing everything to eat or drink and refusing mouth care as well.

## 2019-08-03 ENCOUNTER — Other Ambulatory Visit: Payer: Self-pay | Admitting: Critical Care Medicine

## 2019-08-03 LAB — BASIC METABOLIC PANEL
Anion gap: 8 (ref 5–15)
BUN: 5 mg/dL — ABNORMAL LOW (ref 6–20)
CO2: 25 mmol/L (ref 22–32)
Calcium: 7.9 mg/dL — ABNORMAL LOW (ref 8.9–10.3)
Chloride: 107 mmol/L (ref 98–111)
Creatinine, Ser: 0.9 mg/dL (ref 0.61–1.24)
GFR calc Af Amer: >60 mL/min (ref >60)
GFR calc non Af Amer: >60 mL/min (ref >60)
Glucose, Bld: 91 mg/dL (ref 70–99)
Potassium: 3.1 mmol/L — ABNORMAL LOW (ref 3.5–5.1)
Sodium: 140 mmol/L (ref 135–145)

## 2019-08-03 LAB — CBC
HCT: 31.2 % — ABNORMAL LOW (ref 39.0–52.0)
Hemoglobin: 10.1 g/dL — ABNORMAL LOW (ref 13.0–17.0)
MCH: 31.2 pg (ref 26.0–34.0)
MCHC: 32.4 g/dL (ref 30.0–36.0)
MCV: 96.3 fL (ref 80.0–100.0)
Platelets: 239 10*3/uL (ref 150–400)
RBC: 3.24 MIL/uL — ABNORMAL LOW (ref 4.22–5.81)
RDW: 16.7 % — ABNORMAL HIGH (ref 11.5–15.5)
WBC: 6.6 10*3/uL (ref 4.0–10.5)
nRBC: 0.3 % — ABNORMAL HIGH (ref 0.0–0.2)

## 2019-08-03 LAB — GLUCOSE, CAPILLARY
Glucose-Capillary: 81 mg/dL (ref 70–99)
Glucose-Capillary: 113 mg/dL — ABNORMAL HIGH (ref 70–99)
Glucose-Capillary: 106 mg/dL — ABNORMAL HIGH (ref 70–99)
Glucose-Capillary: 109 mg/dL — ABNORMAL HIGH (ref 70–99)
Glucose-Capillary: 102 mg/dL — ABNORMAL HIGH (ref 70–99)
Glucose-Capillary: 116 mg/dL — ABNORMAL HIGH (ref 70–99)

## 2019-08-03 LAB — MAGNESIUM: Magnesium: 2 mg/dL (ref 1.7–2.4)

## 2019-08-03 MED ORDER — POTASSIUM CHLORIDE 2 MEQ/ML IV SOLN
INTRAVENOUS | Status: DC
  Administered 2019-08-03 – 2019-08-04 (×2): via INTRAVENOUS
  Filled 2019-08-03 (×2): qty 1000

## 2019-08-03 MED ORDER — POTASSIUM CHLORIDE 20 MEQ PO PACK
40.0000 meq | PACK | Freq: Every day | ORAL | Status: DC
  Filled 2019-08-03: qty 2

## 2019-08-03 NOTE — Progress Notes (Signed)
Daily Progress Note   Patient Name: Dionicia AblerJohn Barbary       Date: 08/03/2019 DOB: [birth year]  Age: 58 y.o. MRN#: [id number] Attending Physician: Rhetta MuraSamtani, Jai-Gurmukh, MD Primary Care Physician: Default, Provider, MD Admit Date: 07/28/2019  Reason for Consultation/Follow-up: Establishing goals of care  Subjective: Spoke with RN - oxygen decreased to 8L HFNC, patient continues to refuse all PO intake, agitated. Discussed with RN trying PRN dose of ativan.   Length of Stay: 6  Current Medications: Scheduled Meds:  . Chlorhexidine Gluconate Cloth  6 each Topical Q0600  . collagenase   Topical Daily  . donepezil  5 mg Oral Daily  . heparin  5,000 Units Subcutaneous Q8H  . insulin aspart  0-9 Units Subcutaneous Q4H  . ipratropium-albuterol  3 mL Nebulization TID  . levothyroxine  88 mcg Oral Q0600  . memantine  20 mg Oral Daily  . pantoprazole sodium  40 mg Per Tube Daily  . traZODone  25 mg Per Tube QHS    Continuous Infusions: . sodium chloride Stopped (07/29/19 0622)  .  ceFAZolin (ANCEF) IV Stopped (08/03/19 11910621)  . dextrose 5 % with kcl 75 mL/hr at 08/03/19 1132    PRN Meds: acetaminophen (TYLENOL) oral liquid 160 mg/5 mL, artificial tears, bisacodyl, docusate, haloperidol lactate, LORazepam, oxybutynin      Vital Signs: BP 125/70 (BP Location: Left Arm)   Pulse 86   Temp 98.4 F (36.9 C) (Oral)   Resp (!) 24   Ht 5\' 1"  (1.549 m)   Wt 52.7 kg   SpO2 91%   BMI 21.95 kg/m  SpO2: SpO2: 91 % O2 Device: O2 Device: High Flow Nasal Cannula O2 Flow Rate: O2 Flow Rate (L/min): 8 L/min  Intake/output summary:   Intake/Output Summary (Last 24 hours) at 08/03/2019 1451 Last data filed at 08/03/2019 1400 Gross per 24 hour  Intake 1970.61 ml  Output 3700 ml  Net -1729.39 ml   LBM: Last  BM Date: 08/02/19 Baseline Weight: Weight: 54.4 kg Most recent weight: Weight: 52.7 kg       Palliative Assessment/Data: PPS 20%    Flowsheet Rows     Most Recent Value  Intake Tab  Referral Department  Hospitalist  Unit at Time of Referral  Med/Surg Unit  Palliative Care Primary Diagnosis  Sepsis/Infectious Disease  Date Notified  08/02/19  Palliative Care Type  New Palliative care  Reason for referral  Clarify Goals of Care  Date of Admission  07/28/19  Date first seen by Palliative Care  08/02/19  # of days Palliative referral response time  0 Day(s)  # of days IP prior to Palliative referral  5  Clinical Assessment  Palliative Performance Scale Score  20%  Psychosocial & Spiritual Assessment  Palliative Care Outcomes  Patient/Family meeting held?  Yes  Who was at the meeting?  2 sisters  Palliative Care Outcomes  Improved non-pain symptom therapy, Counseled regarding hospice, Clarified goals of care      Patient Active Problem List   Diagnosis Date Noted  . Palliative care by specialist   . Goals of care, counseling/discussion   . Staphylococcal pneumonia (HCC)   . Healthcare-associated pneumonia   . Scrotal lesion   .  Bacteremia due to Staphylococcus aureus   . Bacteremia due to Streptococcus   . Acute respiratory failure (HCC) 07/28/2019  . Pressure injury of skin 07/28/2019  . Protein-calorie malnutrition, severe 07/28/2019  . Anemia due to acute blood loss 07/09/2019    Class: Acute  . Closed intertrochanteric fracture of hip, left, initial encounter (HCC) 07/05/2019  . Closed displaced fracture of right femoral neck (HCC) 07/05/2019  . Hip fracture (HCC) 07/05/2019  . Hypothyroidism 07/05/2019  . Closed nondisplaced intertrochanteric fracture of left femur (HCC) 07/05/2019    Palliative Care Assessment & Plan   HPI: 58 y.o. male  with past medical history of Down syndrome, dementia, dysphagia, hypothyroidism, and hip fracture admitted on 07/28/2019  with N/V/D. Found to have septic shock with aspiration pneumonia and required intubation. He was recently hospitalized 9/22-9/29 for bilateral hip fractures and required surgery. 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  Patient now extubated, but requiring 15 L oxygen with HFNC. Desturations overnight requiring nasotracheal suctioning. Per RN, he desaturates with any stimulation. He is also refusing all PO intake including medications.   Per RN, does not appear to have pain. Mostly sleeps unless someone is in the room to provide care. With any intervention, he becomes very anxious/agitated/aggressive with staff.   PMT consulted for GOC and symptoms.   Assessment: Follow up with both sister's today - first spoke with Darel Hong Darel Hong has medical background and good understanding of situation, however she is not patient's guardian, sister [name] is). Provided Darel Hong with a brief update and she requested I call her sister [name].  Spoke with [name] at length. [name] shares frustrations - she feels as though Mr. Klemz care is being approached differently d/t his Down Syndrome. She asks many questions that were all addressed and emotional support was provided.   We discussed the situation - discussed his continued dependence on high amounts of oxygen. Discussed refusal of PO intake - [name] is most frustrated by this because she feels that he would eat if "the right person tried". We discussed trial of PRN ativan to see if it calms patient enough that he is able to interact with staff and take in some food/drink. We discussed multiple attempts by many different staff members but patient does not let staff get close to him to attempt to eat.   [name] asks about transferring patient to a hospital that is specialized in caring for patient's with Down Syndrome. I shared that I was not aware of such a facility that could provide the level of care Mr. Kijowski needs at this time.   She does again share her desire to avoid  aggressive interventions - DNR status, no bipap, no feeding tube - however, she would like all other care continued at this point.   Linda plans to visit later today to see how the patient is. We planned to continues goals of care discussions later this week as we see how patient does with current care being provided.   Recommendations/Plan:  2 sisters involved in decision making however one sister is legal guardian - legal guardian is interested in continuing current interventions, other sister seems to be more interested in comfort focused care - for now will continues current measures  Limits of DNR, no bipap, and no feeding tube have been set  PMT will continue to follow   Goals of Care and Additional Recommendations:  Limitations on Scope of Treatment: No Artificial Feeding  Code Status:  DNR  Prognosis:   Unable  to determine  Discharge Planning:  To Be Determined  Care plan was discussed with RN, sisters Vaughan Basta and Bethena Roys  Thank you for allowing the Palliative Medicine Team to assist in the care of this patient.   Total Time 50 minutes Prolonged Time Billed  no    The above conversation was completed via telephone due to the visitor restrictions during the COVID-19 pandemic. Thorough chart review and discussion with necessary members of the care team was completed as part of assessment. All issues were discussed and addressed but no physical exam was performed.    Greater than 50%  of this time was spent counseling and coordinating care related to the above assessment and plan.  Juel Burrow, DNP, [facility] Palliative Medicine Team Team Phone # [phone]  Pager [phone]

## 2019-08-03 NOTE — Progress Notes (Signed)
Nutrition Follow-up  DOCUMENTATION CODES:   Severe malnutrition in context of acute illness/injury  INTERVENTION:    No supplements for now as patient is refusing all POs.  RD to monitor for overall goals of care and assist with maximizing intake as able.   NUTRITION DIAGNOSIS:   Severe Malnutrition related to acute illness(hip fracture) as evidenced by percent weight loss, energy intake < or equal to 50% for > or equal to 5 days, moderate fat depletion, moderate muscle depletion(8% weight loss within 1 month).  Ongoing   GOAL:   Patient will meet greater than or equal to 90% of their needs  Unmet  MONITOR:   PO intake  REASON FOR ASSESSMENT:   Ventilator, Consult Enteral/tube feeding initiation and management  ASSESSMENT:   58 yo male admitted with N/V/D, sepsis, aspiration PNA, hypoxic respiratory failure requiring intubation. Found to have refracture of L hip. PMH includes Downs syndrome, recent L hip fracture s/p IM nailing in September, d/c to SNF for rehab.  Patient self-extubated over this past weekend. Patient becomes agitated and aggressive with any care/intervention.  Currently on a dysphagia 1 diet with thin liquids. Patient is refusing all PO's, including medications. Palliative Care team following. Patient's sisters are deciding on goals of care. They do not want artificial feedings and may decide on comfort care. Labs and medications reviewed. Weight continues to trend down.   Diet Order:   Diet Order            DIET - DYS 1 Room service appropriate? Yes; Fluid consistency: Thin  Diet effective now              EDUCATION NEEDS:   Not appropriate for education at this time  Skin:  Skin Assessment: Skin Integrity Issues: Skin Integrity Issues:: Unstageable, Stage II Stage II: buttocks Unstageable: scrotum  Last BM:  10/20 type 4  Height:   Ht Readings from Last 1 Encounters:  07/28/19 5\' 1"  (1.549 m)    Weight:   Wt Readings from  Last 1 Encounters:  08/03/19 52.7 kg    Ideal Body Weight:  50.9 kg  BMI:  Body mass index is 21.95 kg/m.  Estimated Nutritional Needs:   Kcal:  1600-1800  Protein:  75-90 gm  Fluid:  >/= 1.7 L    Molli Barrows, RD, LDN, [place] Pager [phone] After Hours Pager [phone]

## 2019-08-03 NOTE — Progress Notes (Signed)
PROGRESS NOTE  Brian Stanton WNI:[id number] DOB: August 31, 1961 DOA: 07/28/2019 PCP: Default, Provider, MD   LOS: 6 days   Brief narrative:  66 yoM with hx of Down Syndrome presenting from rehab/ SNF with N/V/D found to have septic shock with aspiration pneumonia and requiring intubation for hypoxic respiratory failure.  Additionally, he had AKI and refracture of left hip nailing from recent hip surgery in September. Recently hospitalized 9/22- 9/29 for bilateral hip fractures (left appearing more acute/ right more chronic) and underwent left Biomet now short trochanteric nail with interlocking and the right hip bipolar hemiarthroplasty done by Dr. [name] on 9/23. Additionally, treated for possible RLL pneumonia with ceftriaxone and azithromycin.  He was discharged to SNF/ rehab.  He is nonverbal at baseline, but can yell incoherently on occasional when he wants someone's attention.  Transferred to medical service from ICU on 08/02/2019.  Assessment/Plan:  Active Problems:   Acute respiratory failure (HCC)   Pressure injury of skin   Protein-calorie malnutrition, severe   Healthcare-associated pneumonia   Scrotal lesion   Bacteremia due to Staphylococcus aureus   Bacteremia due to Streptococcus   Goals of care, counseling/discussion   Staphylococcal pneumonia (HCC)   Palliative care by specialist  Acute respiratory failure, aspiration- staph pneumonia Stable post extubation, wean down O2 as tolerated.  On nasal cannula at this time.  Continue IV antibiotics at this time.  No aggressive measures including BiPAP placement/Artificial feeds. patient is DNR.  Afebrile at this time.  On dysphagia 1 diet.  ID recommends a total of 14 days of cefazolin.  MSSA, group B strep bacteremia infectious disease on board, Continue cefazolin.  Goals of care discussion Code status is full DNR, no aggressive care.  Limited scope of care--abx and rx per Palliative input  Agitation, combativeness refusal to  eat.   benefit from continued goals of care  Hypokalemia. Will replenish through IV c Saline cont K 30 meq at 75/h  Check BMP in a.m.  Scrotal abscess.  Continue current antibiotic.  Continue wound care.  VTE Prophylaxis: Heparin subcu  Code Status: DNR  Family Communication: I spoke with the patient's Sister [name] on the phone and updated her about the clinical condition of the patient.  Disposition Plan: Unknown at this time.  Social worker on board.  MICRO Data 10/15 SARS CoV2 >> neg 10/15 BC x 2 >> Staph Aureus, Streptococcus agalactiae 10/15 UC >> 10/15 trach asp >>  Consultants: Ortho  Procedures: 10/17-self extubated.  Did not require reintubation 10/15 ETT >> 10/18 10/16 Lt IJ CVL >>  Antibiotics:  10/15 Vanco >> 10/17 10/15 Cefepime 10/15 zosyn 10/16 10/16 Unasyn >> 10/17  Cefazolin 10/17 >> Flagyl 10/17 >>  Anti-infectives (From admission, onward)   Start     Dose/Rate Route Frequency Ordered Stop   07/30/19 1515  ceFAZolin (ANCEF) IVPB 2g/100 mL premix     2 g 200 mL/hr over 30 Minutes Intravenous Every 8 hours 07/30/19 1505 08/12/19 2359   07/30/19 1515  metroNIDAZOLE (FLAGYL) IVPB 500 mg  Status:  Discontinued     500 mg 100 mL/hr over 60 Minutes Intravenous Every 8 hours 07/30/19 1505 08/01/19 1804   07/29/19 1800  Ampicillin-Sulbactam (UNASYN) 3 g in sodium chloride 0.9 % 100 mL IVPB  Status:  Discontinued     3 g 200 mL/hr over 30 Minutes Intravenous Every 6 hours 07/29/19 1125 07/30/19 1505   07/29/19 1200  ceFEPIme (MAXIPIME) 2 g in sodium chloride 0.9 % 100 mL IVPB  Status:  Discontinued     2 g 200 mL/hr over 30 Minutes Intravenous Every 8 hours 07/29/19 0836 07/29/19 1125   07/29/19 0930  vancomycin (VANCOCIN) 1,250 mg in sodium chloride 0.9 % 250 mL IVPB  Status:  Discontinued     1,250 mg 166.7 mL/hr over 90 Minutes Intravenous Every 24 hours 07/29/19 0840 07/30/19 1505   07/28/19 2200  piperacillin-tazobactam (ZOSYN) IVPB 3.375 g   Status:  Discontinued     3.375 g 12.5 mL/hr over 240 Minutes Intravenous Every 8 hours 07/28/19 0528 07/28/19 0841   07/28/19 1600  ceFEPIme (MAXIPIME) 2 g in sodium chloride 0.9 % 100 mL IVPB  Status:  Discontinued     2 g 200 mL/hr over 30 Minutes Intravenous Every 12 hours 07/28/19 0841 07/29/19 0836   07/28/19 0527  vancomycin variable dose per unstable renal function (pharmacist dosing)  Status:  Discontinued      Does not apply See admin instructions 07/28/19 0528 07/28/19 1017   07/28/19 0430  ampicillin-sulbactam (UNASYN) 1.5 g in sodium chloride 0.9 % 100 mL IVPB  Status:  Discontinued     1.5 g 200 mL/hr over 30 Minutes Intravenous  Once 07/28/19 0421 07/28/19 0516   07/28/19 0245  vancomycin (VANCOCIN) IVPB 1000 mg/200 mL premix     1,000 mg 200 mL/hr over 60 Minutes Intravenous  Once 07/28/19 0240 07/28/19 0435   07/28/19 0245  ceFEPIme (MAXIPIME) 2 g in sodium chloride 0.9 % 100 mL IVPB     2 g 200 mL/hr over 30 Minutes Intravenous  Once 07/28/19 0240 07/28/19 0403     Subjective: Agitated earlier Ativan given in an attempt to feed Only took a couple of bites and refuses most po.  Objective: Vitals:   08/03/19 1200 08/03/19 1220  BP: 125/70   Pulse: 86   Resp: (!) 24   Temp:  98.4 F (36.9 C)  SpO2: 91%     Intake/Output Summary (Last 24 hours) at 08/03/2019 1525 Last data filed at 08/03/2019 1400 Gross per 24 hour  Intake 1970.61 ml  Output 3700 ml  Net -1729.39 ml   Filed Weights   08/01/19 0402 08/02/19 0312 08/03/19 0405  Weight: 59.4 kg 59.4 kg 52.7 kg   Body mass index is 21.95 kg/m.   Physical Exam: GENERAL:  tachypneic and anxious, on nasal cannula.  Thinly built HENT: No scleral pallor or icterus. Pupils equally reactive to light. Oral mucosa is moist NECK: is supple, no palpable thyroid enlargement. CHEST: Coarse breath sounds present bilaterally.  Appears to be agitated when trying to auscultate. CVS: S1 and S2 heard, no murmur. Regular  rate and rhythm. No pericardial rub. ABDOMEN: Soft, non-tender, scrotal swelling noted.  Foley catheter in place. EXTREMITIES: No edema. CNS: Alert awake anxious and moving all extremities. SKIN: warm and dry   Data Review: I have personally reviewed the following laboratory data and studies,  CBC: Recent Labs  Lab 07/28/19 0301  07/29/19 0235 07/30/19 0434 07/31/19 0601 08/01/19 0441 08/03/19 0459  WBC 17.8*  --  24.5* 27.6* 12.1* 8.2 6.6  NEUTROABS 16.9*  --   --   --   --   --   --   HGB 13.3   < > 9.2* 9.3* 9.3* 10.0* 10.1*  HCT 43.4   < > 29.1* 30.1* 28.3* 30.6* 31.2*  MCV 104.8*  --  103.2* 102.7* 101.4* 97.8 96.3  PLT 488*  --  323 328 236 244 239   < > = values in  this interval not displayed.   Basic Metabolic Panel: Recent Labs  Lab 07/28/19 6213  07/29/19 0235 07/30/19 0434 07/31/19 0601 08/01/19 0441 08/02/19 0205 08/02/19 1938 08/03/19 0459  NA  --    < > 141 143 145 143 141 140 140  K  --    < > 3.3* 3.5 3.3* 3.3* 2.5* 3.5 3.1*  CL  --   --  113* 114* 110 109 104 107 107  CO2  --   --  24 21* GLUCOSE  --   --  112* 138* 96 102* 94 81 91  BUN  --   --  5*  CREATININE  --   --  0.90 0.92 0.74 0.74 0.71 0.74 0.90  CALCIUM  --   --  7.7* 7.5* 7.7* 8.0* 8.0* 7.9* 7.9*  MG 2.2  --  1.8 2.1 1.9 2.0  --   --  2.0  PHOS 3.6  --  1.9* 1.6* 2.4* 2.2*  --   --   --    < > = values in this interval not displayed.   Liver Function Tests: Recent Labs  Lab 07/28/19 0301 07/29/19 0235  AST 31 19  ALT 14 9  ALKPHOS 98 76  BILITOT 0.6 1.1  PROT 5.7* 4.6*  ALBUMIN 2.0* 2.2*   Recent Labs  Lab 07/28/19 0301  LIPASE 46   No results for input(s): AMMONIA in the last 168 hours. Cardiac Enzymes: No results for input(s): CKTOTAL, CKMB, CKMBINDEX, TROPONINI in the last 168 hours. BNP (last 3 results) No results for input(s): BNP in the last 8760 hours.  ProBNP (last 3 results) No results for input(s): PROBNP in the last 8760  hours.  CBG: Recent Labs  Lab 08/02/19 1955 08/02/19 2321 08/03/19 0359 08/03/19 0739 08/03/19 1152  GLUCAP 72 88 81 113* 106*   Recent Results (from the past 240 hour(s))  Blood Culture (routine x 2)     Status: Abnormal   Collection Time: 07/28/19  2:52 AM   Specimen: BLOOD  Result Value Ref Range Status   Specimen Description BLOOD LEFT ANTECUBITAL  Final   Special Requests   Final    BOTTLES DRAWN AEROBIC AND ANAEROBIC Blood Culture adequate volume   Culture  Setup Time   Final    IN BOTH AEROBIC AND ANAEROBIC BOTTLES GRAM POSITIVE COCCI CRITICAL RESULT CALLED TO, READ BACK BY AND VERIFIED WITH: [facility] 07/28/19 1746 JDW Performed at [facility], [address] [place], [place] [address]    Culture STAPHYLOCOCCUS AUREUS STREPTOCOCCUS AGALACTIAE  (A)  Final   Report Status 07/30/2019 FINAL  Final   Organism ID, Bacteria STAPHYLOCOCCUS AUREUS  Final   Organism ID, Bacteria STREPTOCOCCUS AGALACTIAE  Final      Susceptibility   Streptococcus agalactiae - MIC*    CLINDAMYCIN >=1 RESISTANT Resistant     AMPICILLIN <=0.25 SENSITIVE Sensitive     ERYTHROMYCIN >=8 RESISTANT Resistant     VANCOMYCIN 0.5 SENSITIVE Sensitive     CEFTRIAXONE <=0.12 SENSITIVE Sensitive     LEVOFLOXACIN 1 SENSITIVE Sensitive     * STREPTOCOCCUS AGALACTIAE   Staphylococcus aureus - MIC*    CIPROFLOXACIN <=0.5 SENSITIVE Sensitive     ERYTHROMYCIN >=8 RESISTANT Resistant     GENTAMICIN <=0.5 SENSITIVE Sensitive     OXACILLIN 0.5 SENSITIVE Sensitive     TETRACYCLINE <=1 SENSITIVE Sensitive     VANCOMYCIN <=0.5 SENSITIVE  Sensitive     TRIMETH/SULFA <=10 SENSITIVE Sensitive     CLINDAMYCIN RESISTANT Resistant     RIFAMPIN <=0.5 SENSITIVE Sensitive     Inducible Clindamycin POSITIVE Resistant     * STAPHYLOCOCCUS AUREUS  Blood Culture (routine x 2)     Status: None   Collection Time: 07/28/19  3:05 AM   Specimen: BLOOD LEFT HAND  Result Value Ref Range Status   Specimen  Description BLOOD LEFT HAND  Final   Special Requests   Final    BOTTLES DRAWN AEROBIC AND ANAEROBIC Blood Culture adequate volume   Culture   Final    NO GROWTH 5 DAYS Performed at [facility], [address], [place], [place] [address]    Report Status 08/02/2019 FINAL  Final  SARS Coronavirus 2 by RT PCR (hospital order, performed in [facility] hospital lab) Nasopharyngeal Nasopharyngeal Swab     Status: None   Collection Time: 07/28/19  3:28 AM   Specimen: Nasopharyngeal Swab  Result Value Ref Range Status   SARS Coronavirus 2 NEGATIVE NEGATIVE Final    Comment: (NOTE) If result is NEGATIVE SARS-CoV-2 target nucleic acids are NOT DETECTED. The SARS-CoV-2 RNA is generally detectable in upper and lower  respiratory specimens during the acute phase of infection. The lowest  concentration of SARS-CoV-2 viral copies this assay can detect is 250  copies / mL. A negative result does not preclude SARS-CoV-2 infection  and should not be used as the sole basis for treatment or other  patient management decisions.  A negative result may occur with  improper specimen collection / handling, submission of specimen other  than nasopharyngeal swab, presence of viral mutation(s) within the  areas targeted by this assay, and inadequate number of viral copies  (<250 copies / mL). A negative result must be combined with clinical  observations, patient history, and epidemiological information. If result is POSITIVE SARS-CoV-2 target nucleic acids are DETECTED. The SARS-CoV-2 RNA is generally detectable in upper and lower  respiratory specimens dur ing the acute phase of infection.  Positive  results are indicative of active infection with SARS-CoV-2.  Clinical  correlation with patient history and other diagnostic information is  necessary to determine patient infection status.  Positive results do  not rule out bacterial infection or co-infection with other viruses. If result is  PRESUMPTIVE POSTIVE SARS-CoV-2 nucleic acids MAY BE PRESENT.   A presumptive positive result was obtained on the submitted specimen  and confirmed on repeat testing.  While 2019 novel coronavirus  (SARS-CoV-2) nucleic acids may be present in the submitted sample  additional confirmatory testing may be necessary for epidemiological  and / or clinical management purposes  to differentiate between  SARS-CoV-2 and other Sarbecovirus currently known to infect humans.  If clinically indicated additional testing with an alternate test  methodology [id number]) is advised. The SARS-CoV-2 RNA is generally  detectable in upper and lower respiratory sp ecimens during the acute  phase of infection. The expected result is Negative. Fact Sheet for Patients:  StrictlyIdeas.no Fact Sheet for Healthcare Providers: BankingDealers.co.za This test is not yet approved or cleared by the [place] FDA and has been authorized for detection and/or diagnosis of SARS-CoV-2 by FDA under an Emergency Use Authorization (EUA).  This EUA will remain in effect (meaning this test can be used) for the duration of the COVID-19 declaration under Section 564(b)(1) of the Act, 21 U.S.C. section 360bbb-3(b)(1), unless the authorization is terminated or revoked sooner. Performed at [facility], [address] [place], [place] [address]   MRSA PCR Screening     Status: None   Collection Time: 07/28/19  7:25 AM   Specimen: Nasopharyngeal  Result Value Ref Range Status   MRSA by PCR NEGATIVE NEGATIVE Final    Comment:        The GeneXpert MRSA Assay (FDA approved for NASAL specimens only), is one component of a comprehensive MRSA colonization surveillance program. It is not intended to diagnose MRSA infection nor to guide or monitor treatment for MRSA infections. Performed at [facility], [address] [place], [place] [address]   Urine culture      Status: None   Collection Time: 07/30/19  9:24 AM   Specimen: In/Out Cath Urine  Result Value Ref Range Status   Specimen Description IN/OUT CATH URINE  Final   Special Requests NONE  Final   Culture   Final    NO GROWTH Performed at [facility], [address] [place], [place] [address]    Report Status 07/31/2019 FINAL  Final  Culture, blood (Routine X 2) w Reflex to ID Panel     Status: None (Preliminary result)   Collection Time: 07/30/19  9:50 AM   Specimen: BLOOD RIGHT HAND  Result Value Ref Range Status   Specimen Description BLOOD RIGHT HAND  Final   Special Requests   Final    BOTTLES DRAWN AEROBIC ONLY Blood Culture adequate volume   Culture   Final    NO GROWTH 4 DAYS Performed at [facility], [address] [place], [place] [address]    Report Status PENDING  Incomplete  Culture, blood (Routine X 2) w Reflex to ID Panel     Status: None (Preliminary result)   Collection Time: 07/30/19 10:00 AM   Specimen: BLOOD LEFT HAND  Result Value Ref Range Status   Specimen Description BLOOD LEFT HAND  Final   Special Requests   Final    BOTTLES DRAWN AEROBIC ONLY Blood Culture results may not be optimal due to an inadequate volume of blood received in culture bottles   Culture   Final    NO GROWTH 4 DAYS Performed at [facility], [address] [place], [place] [address]    Report Status PENDING  Incomplete  Culture, respiratory (tracheal aspirate)     Status: None   Collection Time: 07/30/19 11:21 AM   Specimen: Tracheal Aspirate; Respiratory  Result Value Ref Range Status   Specimen Description TRACHEAL ASPIRATE  Final   Special Requests NONE  Final   Gram Stain   Final    NO WBC SEEN NO ORGANISMS SEEN Performed at [facility], [address] [place], [place] [address]    Culture FEW STAPHYLOCOCCUS AUREUS  Final   Report Status 08/02/2019 FINAL  Final   Organism ID, Bacteria STAPHYLOCOCCUS AUREUS  Final      Susceptibility    Staphylococcus aureus - MIC*    CIPROFLOXACIN <=0.5 SENSITIVE Sensitive     ERYTHROMYCIN >=8 RESISTANT Resistant     GENTAMICIN <=0.5 SENSITIVE Sensitive     OXACILLIN 0.5 SENSITIVE Sensitive     TETRACYCLINE <=1 SENSITIVE Sensitive     VANCOMYCIN 1 SENSITIVE Sensitive     TRIMETH/SULFA <=10 SENSITIVE Sensitive     CLINDAMYCIN RESISTANT Resistant     RIFAMPIN <=0.5 SENSITIVE Sensitive     Inducible Clindamycin POSITIVE Resistant     * FEW STAPHYLOCOCCUS AUREUS  Studies: Dg Chest Port 1 View  Result Date: 08/02/2019 CLINICAL DATA:  Hypoxia. EXAM: PORTABLE CHEST 1 VIEW COMPARISON:  07/30/2019 FINDINGS: Endotracheal tube, enteric tube, and left central line have been removed. Slight progression in diffuse airspace disease throughout the right lung in the central and lower lung zone predominant distribution. Right pleural effusion grossly unchanged. Hazy opacity at the left lung base likely combination of pleural fluid and airspace disease/atelectasis. Unchanged heart size and mediastinal contours. Calcified mediastinal and left hilar lymph nodes. No visualized pneumothorax. IMPRESSION: 1. Slight progression in diffuse airspace disease throughout the right lung. 2. Unchanged bilateral pleural effusions and left lung base airspace disease/atelectasis. Electronically Signed   By: Narda Rutherford M.D.   On: 08/02/2019 01:18   10/15 XR b/l hip >> 1.  Comminuted periprosthetic fracture/re-injury of the left femoral neck and intertrochanteric region with large effusion and overlying swelling. Transcervical fixation hardware stands proud of the cortex. 2.  Normal location of the right hip hemiarthroplasty with recent postsurgical changes. Alignment is suboptimally evaluated on these nondedicated images and given patient rotation.  Echo 10/17-LVEF 60 to 65%, moderate reduced RV systolic function. No obvious vegetation.   Scheduled Meds: . Chlorhexidine Gluconate Cloth  6 each Topical Q0600  .  collagenase   Topical Daily  . donepezil  5 mg Oral Daily  . heparin  5,000 Units Subcutaneous Q8H  . insulin aspart  0-9 Units Subcutaneous Q4H  . ipratropium-albuterol  3 mL Nebulization TID  . levothyroxine  88 mcg Oral Q0600  . memantine  20 mg Oral Daily  . pantoprazole sodium  40 mg Per Tube Daily  . traZODone  25 mg Per Tube QHS    Continuous Infusions: . sodium chloride Stopped (07/29/19 0622)  .  ceFAZolin (ANCEF) IV Stopped (08/03/19 4098)  . dextrose 5 % with kcl 75 mL/hr at 08/03/19 1132     Rhetta Mura, MD  [facility] 08/03/2019

## 2019-08-03 NOTE — Progress Notes (Signed)
RT unavail for noon CPT d/t being in another emergency.  Came to room now for CPT and HHN.  Pt being cleaned up and family wants to have some time to visit now.  Will check back.

## 2019-08-04 LAB — GLUCOSE, CAPILLARY
Glucose-Capillary: 105 mg/dL — ABNORMAL HIGH (ref 70–99)
Glucose-Capillary: 99 mg/dL (ref 70–99)
Glucose-Capillary: 106 mg/dL — ABNORMAL HIGH (ref 70–99)

## 2019-08-04 LAB — RENAL FUNCTION PANEL
Albumin: 1.8 g/dL — ABNORMAL LOW (ref 3.5–5.0)
Anion gap: 9 (ref 5–15)
BUN: <5 mg/dL — ABNORMAL LOW (ref 6–20)
CO2: 24 mmol/L (ref 22–32)
Calcium: 7.7 mg/dL — ABNORMAL LOW (ref 8.9–10.3)
Chloride: 106 mmol/L (ref 98–111)
Creatinine, Ser: 0.71 mg/dL (ref 0.61–1.24)
GFR calc Af Amer: >60 mL/min (ref >60)
GFR calc non Af Amer: >60 mL/min (ref >60)
Glucose, Bld: 107 mg/dL — ABNORMAL HIGH (ref 70–99)
Phosphorus: 2.5 mg/dL (ref 2.5–4.6)
Potassium: 2.7 mmol/L — CL (ref 3.5–5.1)
Sodium: 139 mmol/L (ref 135–145)

## 2019-08-04 LAB — CBC WITH DIFFERENTIAL/PLATELET
Abs Immature Granulocytes: 0.09 10*3/uL — ABNORMAL HIGH (ref 0.00–0.07)
Basophils Absolute: 0 10*3/uL (ref 0.0–0.1)
Basophils Relative: 1 %
Eosinophils Absolute: 0 10*3/uL (ref 0.0–0.5)
Eosinophils Relative: 1 %
HCT: 29.8 % — ABNORMAL LOW (ref 39.0–52.0)
Hemoglobin: 9.7 g/dL — ABNORMAL LOW (ref 13.0–17.0)
Immature Granulocytes: 1 %
Lymphocytes Relative: 14 %
Lymphs Abs: 0.9 10*3/uL (ref 0.7–4.0)
MCH: 31.3 pg (ref 26.0–34.0)
MCHC: 32.6 g/dL (ref 30.0–36.0)
MCV: 96.1 fL (ref 80.0–100.0)
Monocytes Absolute: 0.8 10*3/uL (ref 0.1–1.0)
Monocytes Relative: 12 %
Neutro Abs: 4.8 10*3/uL (ref 1.7–7.7)
Neutrophils Relative %: 71 %
Platelets: 208 10*3/uL (ref 150–400)
RBC: 3.1 MIL/uL — ABNORMAL LOW (ref 4.22–5.81)
RDW: 17 % — ABNORMAL HIGH (ref 11.5–15.5)
WBC: 6.6 10*3/uL (ref 4.0–10.5)
nRBC: 0.3 % — ABNORMAL HIGH (ref 0.0–0.2)

## 2019-08-04 LAB — MAGNESIUM: Magnesium: 2 mg/dL (ref 1.7–2.4)

## 2019-08-04 LAB — CULTURE, BLOOD (ROUTINE X 2)
Culture: NO GROWTH
Culture: NO GROWTH
Special Requests: ADEQUATE

## 2019-08-04 MED ORDER — POTASSIUM CHLORIDE 2 MEQ/ML IV SOLN
INTRAVENOUS | Status: AC
  Administered 2019-08-04: 09:00:00 via INTRAVENOUS
  Filled 2019-08-04: qty 1000

## 2019-08-04 MED ORDER — POTASSIUM CHLORIDE 10 MEQ/100ML IV SOLN: 10.0000 meq | INTRAVENOUS | Status: DC

## 2019-08-04 MED ORDER — SCOPOLAMINE 1 MG/3DAYS TD PT72
1.0000 | MEDICATED_PATCH | TRANSDERMAL | Status: DC
  Administered 2019-08-04 – 2019-08-07 (×2): 1.5 mg via TRANSDERMAL
  Filled 2019-08-04 (×2): qty 1

## 2019-08-04 MED ORDER — POTASSIUM CHLORIDE CRYS ER 20 MEQ PO TBCR: 40.0000 meq | EXTENDED_RELEASE_TABLET | ORAL | Status: DC

## 2019-08-04 NOTE — Progress Notes (Signed)
Daily Progress Note   Patient Name: Brian Stanton       Date: 08/04/2019 DOB: 29-Jan-1961  Age: 58 y.o. MRN#: [id number] Attending Physician: Nita Sells, MD Primary Care Physician: Default, Provider, MD Admit Date: 07/28/2019  Reason for Consultation/Follow-up: Establishing goals of care  Subjective: Spoke with RN - patient remains agitated, on 10L oxygen taped to face, has required nasotracheal suctioning multiple times. Received ativan x1 yesterday, discontinued this morning by attending  Length of Stay: 7  Current Medications: Scheduled Meds:  . Chlorhexidine Gluconate Cloth  6 each Topical Q0600  . collagenase   Topical Daily  . donepezil  5 mg Oral Daily  . heparin  5,000 Units Subcutaneous Q8H  . insulin aspart  0-9 Units Subcutaneous Q4H  . ipratropium-albuterol  3 mL Nebulization TID  . levothyroxine  88 mcg Oral Q0600  . memantine  20 mg Oral Daily  . pantoprazole sodium  40 mg Per Tube Daily  . traZODone  25 mg Per Tube QHS    Continuous Infusions: . sodium chloride Stopped (07/29/19 0622)  .  ceFAZolin (ANCEF) IV 2 g (08/04/19 0527)  . dextrose 5 %-0.45% nacl with kcl 100 mL/hr at 08/04/19 0845    PRN Meds: acetaminophen (TYLENOL) oral liquid 160 mg/5 mL, artificial tears, bisacodyl, docusate, haloperidol lactate, oxybutynin         Vital Signs: BP 112/63   Pulse 89   Temp 98.4 F (36.9 C) (Axillary)   Resp (!) 37   Ht 5\' 1"  (1.549 m)   Wt 49.7 kg   SpO2 90%   BMI 20.70 kg/m  SpO2: SpO2: 90 % O2 Device: O2 Device: High Flow Nasal Cannula O2 Flow Rate: O2 Flow Rate (L/min): 10 L/min  Intake/output summary:   Intake/Output Summary (Last 24 hours) at 08/04/2019 1019 Last data filed at 08/04/2019 1000 Gross per 24 hour  Intake 2150.84 ml  Output 2890 ml   Net -739.16 ml   LBM: Last BM Date: 08/04/19 Baseline Weight: Weight: 54.4 kg Most recent weight: Weight: 49.7 kg       Palliative Assessment/Data: PPS 20%    Flowsheet Rows     Most Recent Value  Intake Tab  Referral Department  Hospitalist  Unit at Time of Referral  Med/Surg Unit  Palliative Care Primary Diagnosis  Sepsis/Infectious Disease  Date Notified  08/02/19  Palliative Care Type  New Palliative care  Reason for referral  Clarify Goals of Care  Date of Admission  07/28/19  Date first seen by Palliative Care  08/02/19  # of days Palliative referral response time  0 Day(s)  # of days IP prior to Palliative referral  5  Clinical Assessment  Palliative Performance Scale Score  20%  Psychosocial & Spiritual Assessment  Palliative Care Outcomes  Patient/Family meeting held?  Yes  Who was at the meeting?  2 sisters  Palliative Care Outcomes  Improved non-pain symptom therapy, Counseled regarding hospice, Clarified goals of care      Patient Active Problem List   Diagnosis Date Noted  . Palliative care by specialist   . Goals of care, counseling/discussion   . Staphylococcal pneumonia ([place])   . Healthcare-associated pneumonia   . Scrotal  lesion   . Bacteremia due to Staphylococcus aureus   . Bacteremia due to Streptococcus   . Acute respiratory failure (HCC) 07/28/2019  . Pressure injury of skin 07/28/2019  . Protein-calorie malnutrition, severe 07/28/2019  . Anemia due to acute blood loss 07/09/2019    Class: Acute  . Closed intertrochanteric fracture of hip, left, initial encounter (HCC) 07/05/2019  . Closed displaced fracture of right femoral neck (HCC) 07/05/2019  . Hip fracture (HCC) 07/05/2019  . Hypothyroidism 07/05/2019  . Closed nondisplaced intertrochanteric fracture of left femur (HCC) 07/05/2019    Palliative Care Assessment & Plan   HPI: 58 y.o.malewith past medical history of Down syndrome, dementia, dysphagia, hypothyroidism, and hip  fractureadmitted on 10/15/2020with N/V/D.Found to have septic shock with aspiration pneumonia and required intubation. He was recently hospitalized 9/22-9/29 for bilateral hip fractures and required surgery. 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  Patient now extubated, but requiring 15 L oxygen with HFNC. Desturations overnight requiring nasotracheal suctioning. Per RN, he desaturates with any stimulation. He is also refusing all PO intake including medications.   Per RN, does not appear to have pain. Mostly sleeps unless someone is in the room to provide care. With any intervention, he becomes very anxious/agitated/aggressive with staff.   PMT consulted for GOC and symptoms.  Assessment: Received call from sister Darel Hong this AM requesting update (this is the sister who has a medical background but is not guardian of patient - she does have permission from guardian to participate in goals of care conversations).   We discussed patient's clinical condition - discussed lack of improvement, possible some worsening of situation. Discussed he still refuses PO intake.   Darel Hong expresses concern that patient is nearing end of life and she would like to focus more on his comfort.   We reviewed conversation with other sister [name] who is guardian - discussed that [name] would like to continue current interventions as she remains hopeful for improvement. [name] and I plan to speak again tomorrow.  Darel Hong shares that she plans to speak to [employer] about situation and shares her wishes with [name].   Darel Hong asks specifically what comfort care would entail and we discussed measures used to promote comfort.   Questions and concerns addressed. Contact information provided.   Recommendations/Plan:  Patient with no improvement, possibly worse - shared update with sister - one sister leans towards comfort care but sister who is guardian would like to continue current measures - sisters plan to speak together  tonight regarding how to move forward with patient's care  Plans to follow up with both sisters again tomorrow  Goals of Care and Additional Recommendations:  Limitations on Scope of Treatment: No Artificial Feeding  Code Status:  DNR  Prognosis:   Unable to determine  Discharge Planning:  To Be Determined  Care plan was discussed with RN, sister Darel Hong  Thank you for allowing the Palliative Medicine Team to assist in the care of this patient.  The above conversation was completed via telephone due to the visitor restrictions during the COVID-19 pandemic. Thorough chart review and discussion with necessary members of the care team was completed as part of assessment. All issues were discussed and addressed but no physical exam was performed.   Total Time 25 minutes Prolonged Time Billed  no       Greater than 50%  of this time was spent counseling and coordinating care related to the above assessment and plan.  Gerlean Ren, DNP, AGNP-C  Palliative Medicine Team Team Phone # [phone]  Pager [phone]

## 2019-08-04 NOTE — Progress Notes (Signed)
Paged [facility] regarding conversation with Vaughan Basta patient's sister and she stated she would like comfort care beginning tomorrow morning but would like him to remain comfortable until then. [facility] will put in orders and I will continue to monitor. Modena Morrow E, RN 08/04/2019 1:50 PM

## 2019-08-04 NOTE — Progress Notes (Signed)
[facility] [place] Progress Note Patient Name: Nahmir Zeidman DOB: 1961/03/06 MRN: [id number]   Date of Service  08/04/2019  HPI/Events of Note  K+ 2;7  eICU Interventions  KCL 40 meq  Q 4 hours x 2 doses.        Kerry Kass Ogan 08/04/2019, 6:25 AM

## 2019-08-04 NOTE — Progress Notes (Signed)
Scheduled CPT done at this time, but unable to administer scheduled neb tx due to pt pulling mask off, and swats at my arm if I try to hold the treatment for him. Pt with strong, congested cough. RT will continue to monitor.

## 2019-08-04 NOTE — Progress Notes (Signed)
PROGRESS NOTE  Brian AblerJohn Stanton XLK:[id number] DOB: 10/06/1961 DOA: 07/28/2019 PCP: Default, Provider, MD   LOS: 7 days  Brief narrative: [age] yoM with hx of Down Syndrome from rehab/ SNF with N/V/D found to have septic shock with aspiration pneumonia and requiring intubation for hypoxic respiratory failure.  Additionally, he had AKI and refracture of left hip nailing from recent hip surgery in September. Recently hospitalized 9/22- 9/29 for bilateral hip fractures (left appearing more acute/ right more chronic) and underwent left Biomet now short trochanteric nail with interlocking and the right hip bipolar hemiarthroplasty done by Dr. Ophelia CharterYates on 9/23. Additionally, treated for possible RLL pneumonia with ceftriaxone and azithromycin.  He was discharged to SNF/ rehab.  He is nonverbal at baseline, but can yell incoherently on occasional when he wants someone's attention.  Transferred to medical service from ICU on 08/02/2019.  Assessment/Plan:  Active Problems:   Acute respiratory failure (HCC)   Pressure injury of skin   Protein-calorie malnutrition, severe   Healthcare-associated pneumonia   Scrotal lesion   Bacteremia due to Staphylococcus aureus   Bacteremia due to Streptococcus   Goals of care, counseling/discussion   Staphylococcal pneumonia (HCC)   Palliative care by specialist  Acute respiratory failure, aspiration- staph pneumonia Stable post extubation, wean down O2 as tolerated.  On nasal cannula at this time however has had increasing oxygen requirements and unable to wean down lower than 15 L high flow although this is complicated by patient's secretions and thick crusted blood in the nares which she refuses to allow to be cleaned We will place scopolamine patch 1.5 Continue IV antibiotics until discharge No aggressive measures including BiPAP placement/Artificial feeds. DNR.  Afebrile at this time.  On dysphagia 1 diet.  ID recommends a total of 14 days of cefazolin.  MSSA, group  B strep bacteremia infectious disease on board, Continue cefazolin until d/c.  Goals of care discussion Code status is full DNR, no aggressive care.  Limited scope of care--abx and rx per Palliative input  Agitation, combativeness refusal to eat. D/w Brian QuinLinda sister and she is agreeable to full comfort We will transfer to Med-surg, d/c tele  Hypokalemia. Potassium is still low in the 2.7 range magnesium this morning 2.0 replacing with high-dose potassium in D5 80 mEq and monitor trends No further labs  Scrotal abscess.  Continue current antibiotic.  Continue wound care.  VTE Prophylaxis: Heparin subcu Code Status: DNR Family Communication: called and d/w Brian Stanton patient sister on phone Disposition Plan: Unknown at this time.  Social worker on board.  MICRO Data 10/15 SARS CoV2 >> neg 10/15 BC x 2 >> Staph Aureus, Streptococcus agalactiae 10/15 UC >> 10/15 trach asp >>  Consultants: Ortho  Procedures: 10/17-self extubated.  Did not require reintubation 10/15 ETT >> 10/18 10/16 Lt IJ CVL >>  Antibiotics:  10/15 Vanco >> 10/17 10/15 Cefepime 10/15 zosyn 10/16 10/16 Unasyn >> 10/17  Cefazolin 10/17 >> Flagyl 10/17 >>  Anti-infectives (From admission, onward)   Start     Dose/Rate Route Frequency Ordered Stop   07/30/19 1515  ceFAZolin (ANCEF) IVPB 2g/100 mL premix     2 g 200 mL/hr over 30 Minutes Intravenous Every 8 hours 07/30/19 1505 08/12/19 2359   07/30/19 1515  metroNIDAZOLE (FLAGYL) IVPB 500 mg  Status:  Discontinued     500 mg 100 mL/hr over 60 Minutes Intravenous Every 8 hours 07/30/19 1505 08/01/19 1804   07/29/19 1800  Ampicillin-Sulbactam (UNASYN) 3 g in sodium chloride 0.9 % 100 mL IVPB  Status:  Discontinued     3 g 200 mL/hr over 30 Minutes Intravenous Every 6 hours 07/29/19 1125 07/30/19 1505   07/29/19 1200  ceFEPIme (MAXIPIME) 2 g in sodium chloride 0.9 % 100 mL IVPB  Status:  Discontinued     2 g 200 mL/hr over 30 Minutes Intravenous Every 8 hours  07/29/19 0836 07/29/19 1125   07/29/19 0930  vancomycin (VANCOCIN) 1,250 mg in sodium chloride 0.9 % 250 mL IVPB  Status:  Discontinued     1,250 mg 166.7 mL/hr over 90 Minutes Intravenous Every 24 hours 07/29/19 0840 07/30/19 1505   07/28/19 2200  piperacillin-tazobactam (ZOSYN) IVPB 3.375 g  Status:  Discontinued     3.375 g 12.5 mL/hr over 240 Minutes Intravenous Every 8 hours 07/28/19 0528 07/28/19 0841   07/28/19 1600  ceFEPIme (MAXIPIME) 2 g in sodium chloride 0.9 % 100 mL IVPB  Status:  Discontinued     2 g 200 mL/hr over 30 Minutes Intravenous Every 12 hours 07/28/19 0841 07/29/19 0836   07/28/19 0527  vancomycin variable dose per unstable renal function (pharmacist dosing)  Status:  Discontinued      Does not apply See admin instructions 07/28/19 0528 07/28/19 1017   07/28/19 0430  ampicillin-sulbactam (UNASYN) 1.5 g in sodium chloride 0.9 % 100 mL IVPB  Status:  Discontinued     1.5 g 200 mL/hr over 30 Minutes Intravenous  Once 07/28/19 0421 07/28/19 0516   07/28/19 0245  vancomycin (VANCOCIN) IVPB 1000 mg/200 mL premix     1,000 mg 200 mL/hr over 60 Minutes Intravenous  Once 07/28/19 0240 07/28/19 0435   07/28/19 0245  ceFEPIme (MAXIPIME) 2 g in sodium chloride 0.9 % 100 mL IVPB     2 g 200 mL/hr over 30 Minutes Intravenous  Once 07/28/19 0240 07/28/19 0403     Subjective: Awake to some degree  Refuses oxygen on nose but sats drop ~ 85%   Objective: Vitals:   08/04/19 1052 08/04/19 1120  BP: (!) 144/66   Pulse: 81   Resp: (!) 27   Temp:  98.6 F (37 C)  SpO2: 94%     Intake/Output Summary (Last 24 hours) at 08/04/2019 1132 Last data filed at 08/04/2019 1000 Gross per 24 hour  Intake 2100.87 ml  Output 2890 ml  Net -789.13 ml   Filed Weights   08/02/19 0312 08/03/19 0405 08/04/19 0328  Weight: 59.4 kg 52.7 kg 49.7 kg   Body mass index is 20.7 kg/m.   Physical Exam: GENERAL:  tachypneic on nasal cannula.  Thinly built HENT: No scleral pallor or icterus.  Pupils equally reactive to light. Oral mucosa is moist NECK: is supple, no palpable thyroid enlargement. CHEST: Coarse breath sounds present bilaterally.  Appears to be agitated when trying to auscultate. CVS: S1 and S2 heard, no murmur. Regular rate and rhythm. No pericardial rub. ABDOMEN: Soft, non-tender, scrotal swelling noted.  Foley catheter in place. EXTREMITIES: No edema. CNS: Alert awake anxious and moving all extremities. SKIN: warm and dry   Data Review: I have personally reviewed the following laboratory data and studies,  CBC: Recent Labs  Lab 07/30/19 0434 07/31/19 0601 08/01/19 0441 08/03/19 0459 08/04/19 0303  WBC 27.6* 12.1* 8.2 6.6 6.6  NEUTROABS  --   --   --   --  4.8  HGB 9.3* 9.3* 10.0* 10.1* 9.7*  HCT 30.1* 28.3* 30.6* 31.2* 29.8*  MCV 102.7* 101.4* 97.8 96.3 96.1  PLT 328 236 244 239 208  Basic Metabolic Panel: Recent Labs  Lab 07/29/19 0235 07/30/19 0434 07/31/19 0601 08/01/19 0441 08/02/19 0205 08/02/19 1938 08/03/19 0459 08/04/19 0303 08/04/19 0708  NA 141 143 145 143 141 140 140 139  --   K 3.3* 3.5 3.3* 3.3* 2.5* 3.5 3.1* 2.7*  --   CL 113* 114* 110 109 104 107 107 106  --   CO2 24 21* --   GLUCOSE 112* 138* 96 102* 94 81 91 107*  --   BUN 5* <5*  --   CREATININE 0.90 0.92 0.74 0.74 0.71 0.74 0.90 0.71  --   CALCIUM 7.7* 7.5* 7.7* 8.0* 8.0* 7.9* 7.9* 7.7*  --   MG 1.8 2.1 1.9 2.0  --   --  2.0  --  2.0  PHOS 1.9* 1.6* 2.4* 2.2*  --   --   --  2.5  --    Liver Function Tests: Recent Labs  Lab 07/29/19 0235 08/04/19 0303  AST 19  --   ALT 9  --   ALKPHOS 76  --   BILITOT 1.1  --   PROT 4.6*  --   ALBUMIN 2.2* 1.8*   No results for input(s): LIPASE, AMYLASE in the last 168 hours. No results for input(s): AMMONIA in the last 168 hours. Cardiac Enzymes: No results for input(s): CKTOTAL, CKMB, CKMBINDEX, TROPONINI in the last 168 hours. BNP (last 3 results) No results for input(s): BNP in the  last 8760 hours.  ProBNP (last 3 results) No results for input(s): PROBNP in the last 8760 hours.  CBG: Recent Labs  Lab 08/03/19 1929 08/03/19 2308 08/04/19 0321 08/04/19 0729 08/04/19 1119  GLUCAP 102* 116* 105* 99 106*   Recent Results (from the past 240 hour(s))  Blood Culture (routine x 2)     Status: Abnormal   Collection Time: 07/28/19  2:52 AM   Specimen: BLOOD  Result Value Ref Range Status   Specimen Description BLOOD LEFT ANTECUBITAL  Final   Special Requests   Final    BOTTLES DRAWN AEROBIC AND ANAEROBIC Blood Culture adequate volume   Culture  Setup Time   Final    IN BOTH AEROBIC AND ANAEROBIC BOTTLES GRAM POSITIVE COCCI CRITICAL RESULT CALLED TO, READ BACK BY AND VERIFIED WITH: [facility] 07/28/19 1746 JDW Performed at [facility], [address] [place], [place] [address]    Culture STAPHYLOCOCCUS AUREUS STREPTOCOCCUS AGALACTIAE  (A)  Final   Report Status 07/30/2019 FINAL  Final   Organism ID, Bacteria STAPHYLOCOCCUS AUREUS  Final   Organism ID, Bacteria STREPTOCOCCUS AGALACTIAE  Final      Susceptibility   Streptococcus agalactiae - MIC*    CLINDAMYCIN >=1 RESISTANT Resistant     AMPICILLIN <=0.25 SENSITIVE Sensitive     ERYTHROMYCIN >=8 RESISTANT Resistant     VANCOMYCIN 0.5 SENSITIVE Sensitive     CEFTRIAXONE <=0.12 SENSITIVE Sensitive     LEVOFLOXACIN 1 SENSITIVE Sensitive     * STREPTOCOCCUS AGALACTIAE   Staphylococcus aureus - MIC*    CIPROFLOXACIN <=0.5 SENSITIVE Sensitive     ERYTHROMYCIN >=8 RESISTANT Resistant     GENTAMICIN <=0.5 SENSITIVE Sensitive     OXACILLIN 0.5 SENSITIVE Sensitive     TETRACYCLINE <=1 SENSITIVE Sensitive     VANCOMYCIN <=0.5 SENSITIVE Sensitive     TRIMETH/SULFA <=10 SENSITIVE Sensitive     CLINDAMYCIN RESISTANT Resistant     RIFAMPIN <=0.5 SENSITIVE Sensitive  Inducible Clindamycin POSITIVE Resistant     * STAPHYLOCOCCUS AUREUS  Blood Culture (routine x 2)     Status: None   Collection Time:  07/28/19  3:05 AM   Specimen: BLOOD LEFT HAND  Result Value Ref Range Status   Specimen Description BLOOD LEFT HAND  Final   Special Requests   Final    BOTTLES DRAWN AEROBIC AND ANAEROBIC Blood Culture adequate volume   Culture   Final    NO GROWTH 5 DAYS Performed at [facility], [address] [place], [place] [address]    Report Status 08/02/2019 FINAL  Final  SARS Coronavirus 2 by RT PCR (hospital order, performed in [facility] hospital lab) Nasopharyngeal Nasopharyngeal Swab     Status: None   Collection Time: 07/28/19  3:28 AM   Specimen: Nasopharyngeal Swab  Result Value Ref Range Status   SARS Coronavirus 2 NEGATIVE NEGATIVE Final    Comment: (NOTE) If result is NEGATIVE SARS-CoV-2 target nucleic acids are NOT DETECTED. The SARS-CoV-2 RNA is generally detectable in upper and lower  respiratory specimens during the acute phase of infection. The lowest  concentration of SARS-CoV-2 viral copies this assay can detect is 250  copies / mL. A negative result does not preclude SARS-CoV-2 infection  and should not be used as the sole basis for treatment or other  patient management decisions.  A negative result may occur with  improper specimen collection / handling, submission of specimen other  than nasopharyngeal swab, presence of viral mutation(s) within the  areas targeted by this assay, and inadequate number of viral copies  (<250 copies / mL). A negative result must be combined with clinical  observations, patient history, and epidemiological information. If result is POSITIVE SARS-CoV-2 target nucleic acids are DETECTED. The SARS-CoV-2 RNA is generally detectable in upper and lower  respiratory specimens dur ing the acute phase of infection.  Positive  results are indicative of active infection with SARS-CoV-2.  Clinical  correlation with patient history and other diagnostic information is  necessary to determine patient infection status.  Positive results do    not rule out bacterial infection or co-infection with other viruses. If result is PRESUMPTIVE POSTIVE SARS-CoV-2 nucleic acids MAY BE PRESENT.   A presumptive positive result was obtained on the submitted specimen  and confirmed on repeat testing.  While 2019 novel coronavirus  (SARS-CoV-2) nucleic acids may be present in the submitted sample  additional confirmatory testing may be necessary for epidemiological  and / or clinical management purposes  to differentiate between  SARS-CoV-2 and other Sarbecovirus currently known to infect humans.  If clinically indicated additional testing with an alternate test  methodology [phone]) is advised. The SARS-CoV-2 RNA is generally  detectable in upper and lower respiratory sp ecimens during the acute  phase of infection. The expected result is Negative. Fact Sheet for Patients:  BoilerBrush.com.cy Fact Sheet for Healthcare Providers: https://pope.com/ This test is not yet approved or cleared by the [place] FDA and has been authorized for detection and/or diagnosis of SARS-CoV-2 by FDA under an Emergency Use Authorization (EUA).  This EUA will remain in effect (meaning this test can be used) for the duration of the COVID-19 declaration under Section 564(b)(1) of the Act, 21 U.S.C. section 360bbb-3(b)(1), unless the authorization is terminated or revoked sooner. Performed at [facility], [address], [place], [place] [address]   MRSA PCR Screening     Status: None   Collection Time: 07/28/19  7:25 AM   Specimen: Nasopharyngeal  Result Value Ref Range Status   MRSA by PCR NEGATIVE NEGATIVE Final    Comment:        The GeneXpert MRSA Assay (FDA approved for NASAL specimens only), is one component of a comprehensive MRSA colonization surveillance program. It is not intended to diagnose MRSA infection nor to guide or monitor treatment for MRSA infections. Performed at  [facility], [address], [place], [place] [address]   Urine culture     Status: None   Collection Time: 07/30/19  9:24 AM   Specimen: In/Out Cath Urine  Result Value Ref Range Status   Specimen Description IN/OUT CATH URINE  Final   Special Requests NONE  Final   Culture   Final    NO GROWTH Performed at [facility], [address], [place], [place] [address]    Report Status 07/31/2019 FINAL  Final  Culture, blood (Routine X 2) w Reflex to ID Panel     Status: None   Collection Time: 07/30/19  9:50 AM   Specimen: BLOOD RIGHT HAND  Result Value Ref Range Status   Specimen Description BLOOD RIGHT HAND  Final   Special Requests   Final    BOTTLES DRAWN AEROBIC ONLY Blood Culture adequate volume   Culture   Final    NO GROWTH 5 DAYS Performed at [facility], [address] [place], [place] [address]    Report Status 08/04/2019 FINAL  Final  Culture, blood (Routine X 2) w Reflex to ID Panel     Status: None   Collection Time: 07/30/19 10:00 AM   Specimen: BLOOD LEFT HAND  Result Value Ref Range Status   Specimen Description BLOOD LEFT HAND  Final   Special Requests   Final    BOTTLES DRAWN AEROBIC ONLY Blood Culture results may not be optimal due to an inadequate volume of blood received in culture bottles   Culture   Final    NO GROWTH 5 DAYS Performed at [facility], [address], [place], [place] [address]    Report Status 08/04/2019 FINAL  Final  Culture, respiratory (tracheal aspirate)     Status: None   Collection Time: 07/30/19 11:21 AM   Specimen: Tracheal Aspirate; Respiratory  Result Value Ref Range Status   Specimen Description TRACHEAL ASPIRATE  Final   Special Requests NONE  Final   Gram Stain   Final    NO WBC SEEN NO ORGANISMS SEEN Performed at [name], [address] [place] [address], [place], [place] [address]    Culture FEW STAPHYLOCOCCUS AUREUS  Final   Report Status 08/02/2019 FINAL  Final   Organism ID, Bacteria  STAPHYLOCOCCUS AUREUS  Final      Susceptibility   Staphylococcus aureus - MIC*    CIPROFLOXACIN <=0.5 SENSITIVE Sensitive     ERYTHROMYCIN >=8 RESISTANT Resistant     GENTAMICIN <=0.5 SENSITIVE Sensitive     OXACILLIN 0.5 SENSITIVE Sensitive     TETRACYCLINE <=1 SENSITIVE Sensitive     VANCOMYCIN 1 SENSITIVE Sensitive     TRIMETH/SULFA <=10 SENSITIVE Sensitive     CLINDAMYCIN RESISTANT Resistant     RIFAMPIN <=0.5 SENSITIVE Sensitive     Inducible Clindamycin POSITIVE Resistant     * FEW STAPHYLOCOCCUS AUREUS     Studies: No results found. 10/15 XR b/l hip >> 1.  Comminuted periprosthetic fracture/re-injury of the left femoral neck and intertrochanteric region with large effusion  and overlying swelling. Transcervical fixation hardware stands proud of the cortex. 2.  Normal location of the right hip hemiarthroplasty with recent postsurgical changes. Alignment is suboptimally evaluated on these nondedicated images and given patient rotation.  Echo 10/17-LVEF 60 to 65%, moderate reduced RV systolic function. No obvious vegetation.   Scheduled Meds:  Chlorhexidine Gluconate Cloth  6 each Topical Q0600   collagenase   Topical Daily   donepezil  5 mg Oral Daily   heparin  5,000 Units Subcutaneous Q8H   insulin aspart  0-9 Units Subcutaneous Q4H   ipratropium-albuterol  3 mL Nebulization TID   levothyroxine  88 mcg Oral Q0600   memantine  20 mg Oral Daily   pantoprazole sodium  40 mg Per Tube Daily   traZODone  25 mg Per Tube QHS    Continuous Infusions:  sodium chloride Stopped (07/29/19 0622)    ceFAZolin (ANCEF) IV 2 g (08/04/19 0527)   dextrose 5 %-0.45% nacl with kcl 100 mL/hr at 08/04/19 0845     Nita Sells, MD  [facility] 08/04/2019

## 2019-08-04 NOTE — Progress Notes (Signed)
Paged [facility] regarding low oxygen saturations and attempted to NTS patient. Was not able to due to dried blood in nose from excessive NTS. Spoke with palliative this morning and family still not able to make decision at this time. [facility] came and saw patient and just simply repositioned oxygen in nose. Will continue to monitor and reposition as needed. Modena Morrow E, RN 08/04/2019 11:03 AM

## 2019-08-04 NOTE — Progress Notes (Signed)
CRITICAL VALUE ALERT  Critical Value:  K 2.7   Date & Time Notied: 10/22 0430  Provider Notified: Warren Lacy   Orders Received/Actions taken: Awaiting orders

## 2019-08-05 ENCOUNTER — Other Ambulatory Visit: Payer: Self-pay

## 2019-08-05 DIAGNOSIS — Z515 Encounter for palliative care: Secondary | ICD-10-CM

## 2019-08-05 MED ORDER — MORPHINE SULFATE 10 MG/5ML PO SOLN: 5.0000 mg | ORAL | Status: DC | PRN

## 2019-08-05 MED ORDER — GLYCOPYRROLATE 0.2 MG/ML IJ SOLN
0.2000 mg | INTRAMUSCULAR | Status: DC | PRN
  Administered 2019-08-05 – 2019-08-08 (×5): 0.2 mg via INTRAVENOUS
  Filled 2019-08-05 (×5): qty 1

## 2019-08-05 MED ORDER — MORPHINE SULFATE (PF) 2 MG/ML IV SOLN: 1.0000 mg | INTRAVENOUS | Status: DC | PRN

## 2019-08-05 MED ORDER — LORAZEPAM 2 MG/ML IJ SOLN
1.0000 mg | INTRAMUSCULAR | Status: DC | PRN
  Administered 2019-08-05 – 2019-08-08 (×7): 1 mg via INTRAVENOUS
  Filled 2019-08-05 (×7): qty 1

## 2019-08-05 MED ORDER — LORAZEPAM 2 MG/ML PO CONC: 1.0000 mg | ORAL | Status: DC | PRN

## 2019-08-05 MED ORDER — MORPHINE SULFATE (PF) 2 MG/ML IV SOLN
2.0000 mg | Freq: Once | INTRAVENOUS | Status: AC
  Administered 2019-08-05: 2 mg via INTRAVENOUS
  Filled 2019-08-05: qty 1

## 2019-08-05 MED ORDER — GLYCOPYRROLATE 0.2 MG/ML IJ SOLN
0.2000 mg | INTRAMUSCULAR | Status: DC | PRN
  Administered 2019-08-05 – 2019-08-06 (×2): 0.2 mg via SUBCUTANEOUS
  Filled 2019-08-05 (×2): qty 1

## 2019-08-05 MED ORDER — MORPHINE BOLUS VIA INFUSION
1.0000 mg | INTRAVENOUS | Status: DC | PRN
  Administered 2019-08-06 – 2019-08-07 (×4): 1 mg via INTRAVENOUS
  Filled 2019-08-05: qty 1

## 2019-08-05 MED ORDER — GLYCOPYRROLATE 1 MG PO TABS
1.0000 mg | ORAL_TABLET | ORAL | Status: DC | PRN
  Filled 2019-08-05: qty 1

## 2019-08-05 MED ORDER — LORAZEPAM 1 MG PO TABS: 1.0000 mg | ORAL_TABLET | ORAL | Status: DC | PRN

## 2019-08-05 MED ORDER — MORPHINE SULFATE (PF) 2 MG/ML IV SOLN
0.5000 mg | INTRAVENOUS | Status: DC | PRN
  Administered 2019-08-05: 0.5 mg via INTRAVENOUS
  Filled 2019-08-05: qty 1

## 2019-08-05 MED ORDER — MORPHINE 100MG IN NS 100ML (1MG/ML) PREMIX INFUSION
1.0000 mg/h | INTRAVENOUS | Status: DC
  Administered 2019-08-05: 1 mg/h via INTRAVENOUS
  Administered 2019-08-08: 2 mg/h via INTRAVENOUS
  Filled 2019-08-05 (×2): qty 100

## 2019-08-05 NOTE — Progress Notes (Signed)
Attempt to call report, nurse just received patient from the ED. Will give them 15 mins and call back. Modena Morrow E, RN 08/05/2019 1:52 PM

## 2019-08-05 NOTE — Progress Notes (Signed)
[facility]M 09 - [facility]) Hospital Liaison Note:   Received request from Waunita Schooner, TOC/CSW for family interest in [facility]. Chart reviewed and spoke with family to acknowledge referral, answer questions and explain services.    Unfortunately [facility] is not able to offer a room today. Family and TOC/CSW are aware.  [facility] Liaison will follow up with CSW and family tomorrow or sooner if room becomes available.   Please do not hesitate to call with questions,  Thank you.   Gar Ponto, RN [facility] Liaison  [place] are on AMION

## 2019-08-05 NOTE — Plan of Care (Signed)
  Problem: Respiratory: Goal: Verbalizations of increased ease of respirations will increase Outcome: Not Progressing   Problem: Education: Goal: Knowledge of the prescribed therapeutic regimen will improve Outcome: Not Met (add Reason)   Problem: Coping: Goal: Ability to identify and develop effective coping behavior will improve Outcome: Not Met (add Reason)   Problem: Clinical Measurements: Goal: Quality of life will improve Outcome: Not Met (add Reason)

## 2019-08-05 NOTE — Progress Notes (Signed)
Admitted from @m , patient somewhat agitated with moving, noted increased secretions as well, prn meds given. Will continue to monitor.

## 2019-08-05 NOTE — Progress Notes (Signed)
CSW received consult for patient to monitor for placement at discharge, patient has now been placed on comfort care measures only.  Madilyn Fireman, MSW, LCSW-A Transitions of Care  Clinical Social Worker  [facility] Emergency Departments  Medical ICU [phone]

## 2019-08-05 NOTE — Progress Notes (Signed)
Family was upset about patient not having pain medicine at the bedside for patient when oxygen was removed. Paged [facility] about putting in some pain medication although through the FACES pain scale he is not in any pain. Awaiting medication orders from [facility] as oral morphine was ordered and the patient does not eat or drink at this time. Will continue to monitor. Modena Morrow E, RN 08/05/2019 11:36 AM

## 2019-08-05 NOTE — Progress Notes (Signed)
Daily Progress Note   Patient Name: Brian Stanton       Date: 08/05/2019 DOB: 06-24-1961  Age: 58 y.o. MRN#: [id number] Attending Physician: Nita Sells, MD Primary Care Physician: Default, Provider, MD Admit Date: 07/28/2019  Reason for Consultation/Follow-up: Establishing goals of care  Subjective: Notified by RN of transition to comfort measures, need to speak with family. Per RN, patient has been liberated from oxygen and appears to be comfortable. No symptom management concerns. Continues to refuse all PO intake, family at bedside earlier but have left now.   Length of Stay: 8  Current Medications: Scheduled Meds:  . collagenase   Topical Daily  . scopolamine  1 patch Transdermal Q72H    Continuous Infusions:   PRN Meds: acetaminophen (TYLENOL) oral liquid 160 mg/5 mL, artificial tears, glycopyrrolate **OR** glycopyrrolate **OR** glycopyrrolate, haloperidol lactate, LORazepam **OR** LORazepam **OR** LORazepam, morphine injection         Vital Signs: BP 91/61   Pulse 81   Temp 99.4 F (37.4 C) (Axillary)   Resp (!) 24   Ht 5\' 1"  (1.549 m)   Wt 49.7 kg   SpO2 (!) 86%   BMI 20.70 kg/m  SpO2: SpO2: (!) 86 % O2 Device: O2 Device: Room Air O2 Flow Rate: O2 Flow Rate (L/min): 15 L/min  Intake/output summary:   Intake/Output Summary (Last 24 hours) at 08/05/2019 1306 Last data filed at 08/05/2019 1000 Gross per 24 hour  Intake 1027.33 ml  Output 1850 ml  Net -822.67 ml   LBM: Last BM Date: 08/04/19 Baseline Weight: Weight: 54.4 kg Most recent weight: Weight: 49.7 kg       Palliative Assessment/Data: PPS 20%    Flowsheet Rows     Most Recent Value  Intake Tab  Referral Department  Hospitalist  Unit at Time of Referral  Med/Surg Unit  Palliative Care Primary  Diagnosis  Sepsis/Infectious Disease  Date Notified  08/02/19  Palliative Care Type  New Palliative care  Reason for referral  Clarify Goals of Care  Date of Admission  07/28/19  Date first seen by Palliative Care  08/02/19  # of days Palliative referral response time  0 Day(s)  # of days IP prior to Palliative referral  5  Clinical Assessment  Palliative Performance Scale Score  20%  Psychosocial & Spiritual Assessment  Palliative Care Outcomes  Patient/Family meeting held?  Yes  Who was at the meeting?  2 sisters  Palliative Care Outcomes  Improved non-pain symptom therapy, Counseled regarding hospice, Clarified goals of care      Patient Active Problem List   Diagnosis Date Noted  . Palliative care by specialist   . Goals of care, counseling/discussion   . Staphylococcal pneumonia ([place])   . Healthcare-associated pneumonia   . Scrotal lesion   . Bacteremia due to Staphylococcus aureus   . Bacteremia due to Streptococcus   . Acute respiratory failure ([name]) 07/28/2019  . Pressure injury of skin 07/28/2019  . Protein-calorie malnutrition, severe 07/28/2019  . Anemia due to acute blood loss 07/09/2019    Class: Acute  . Closed intertrochanteric fracture of hip, left, initial encounter ([place]) 07/05/2019  . Closed displaced fracture of right femoral neck ([place]) 07/05/2019  .  Hip fracture ([facility]) 07/05/2019  . Hypothyroidism 07/05/2019  . Closed nondisplaced intertrochanteric fracture of left femur ([facility]) 07/05/2019    Palliative Care Assessment & Plan   HPI: 58 y.o.malewith past medical history of Down syndrome, dementia, dysphagia, hypothyroidism, and hip fractureadmitted on 10/15/2020with N/V/D.Found to have septic shock with aspiration pneumonia and required intubation. He was recently hospitalized 9/22-9/29 for bilateral hip fractures and required surgery. 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  Patient now extubated, but requiring 15 L oxygen with HFNC. Desturations  overnight requiring nasotracheal suctioning. Per RN, he desaturates with any stimulation. He is also refusing all PO intake including medications.   Per RN, does not appear to have pain. Mostly sleeps unless someone is in the room to provide care. With any intervention, he becomes very anxious/agitated/aggressive with staff.   PMT consulted for GOC and symptoms.  Assessment: Spoke with both sisters separately - both agree that they want to focus on comfort measures at this point. Both speak about being at the bedside while patient was freed from oxygen. They request that medications be ordered so that if he has any symptom management needs they can be addressed quickly by nursing. Both also express interest in hospice facility placement if bed is available. All questions and concerns addressed.    Recommendations/Plan:  Full comfort care initiated this morning per [facility]ad  Orders adjusted per nursing/family request  Reaching out to social work regarding placement - family would like [facility] if bed is available while patient can still tolerate transfer  Goals of Care and Additional Recommendations:  Limitations on Scope of Treatment: No Artificial Feeding  Code Status:  DNR  Prognosis:   < 2 weeks  Discharge Planning:  Hospice facility vs hospital death  Care plan was discussed with RN, sisters Darel Hong and linda  Thank you for allowing the Palliative Medicine Team to assist in the care of this patient.  The above conversation was completed via telephone due to the visitor restrictions during the COVID-19 pandemic. Thorough chart review and discussion with necessary members of the care team was completed as part of assessment. All issues were discussed and addressed but no physical exam was performed.   Total Time 35 minutes Prolonged Time Billed  no       Greater than 50%  of this time was spent counseling and coordinating care related to the above assessment and plan.   Gerlean Ren, DNP, [facility] Palliative Medicine Team Team Phone # [phone]  Pager [phone]

## 2019-08-05 NOTE — Progress Notes (Signed)
Nutrition Brief Note  Chart reviewed. Patient is now receiving comfort care.  He is currently NPO, refusing to eat or drink. No further nutrition interventions warranted at this time.  Please re-consult as needed.   Molli Barrows, RD, LDN, [place] Pager [phone] After Hours Pager [phone]

## 2019-08-05 NOTE — Progress Notes (Signed)
PROGRESS NOTE  Brian AblerJohn Stanton ZOX:[id number] DOB: 09/23/1961 DOA: 07/28/2019 PCP: Default, Provider, MD   LOS: 8 days  Brief narrative: [age] yoM with hx of Down Syndrome from rehab/ SNF with N/V/D found to have septic shock with aspiration pneumonia and requiring intubation for hypoxic respiratory failure.  Additionally, he had AKI and refracture of left hip nailing from recent hip surgery in September. Recently hospitalized 9/22- 9/29 for bilateral hip fractures (left appearing more acute/ right more chronic) and underwent left Biomet now short trochanteric nail with interlocking and the right hip bipolar hemiarthroplasty done by Dr. Ophelia Stanton on 9/23. Additionally, treated for possible RLL pneumonia with ceftriaxone and azithromycin.  He was discharged to SNF/ rehab.  Transferred to medical service from ICU on 08/02/2019. Made comfort care 10/22  Assessment/Plan:  Active Problems:   Acute respiratory failure (HCC)   Pressure injury of skin   Protein-calorie malnutrition, severe   Healthcare-associated pneumonia   Scrotal lesion   Bacteremia due to Staphylococcus aureus   Bacteremia due to Streptococcus   Goals of care, counseling/discussion   Staphylococcal pneumonia (HCC)   Palliative care by specialist   Comfort measures only status  Acute respiratory failure, aspiration- staph pneumonia expect poor outcome Gasping breaths at bedside Transition to morphine Gtt Do not think will survive beyond next 48 hours If this occurs would transfer then top free-standing Hopsice MSSA, group B strep bacteremia infectious disease on board-abx stopped 10/22 Goals of care discussion Code status is full DNR Agitation, combativeness refusal to eat. D/w Brian Stanton sister and she is agreeable to full comfort Hypokalemia. Scrotal abscess.  Marland Kitchen.  VTE Prophylaxis: Heparin subcu Code Status: DNR Family Communication:no family present Disposition Plan: Unknown at this time.  Social worker on board.  MICRO Data  10/15 SARS CoV2 >> neg 10/15 BC x 2 >> Staph Aureus, Streptococcus agalactiae 10/15 UC >> 10/15 trach asp >>  Consultants: Ortho  Procedures: 10/17-self extubated.  Did not require reintubation 10/15 ETT >> 10/18 10/16 Lt IJ CVL >>  Antibiotics:  10/15 Vanco >> 10/17 10/15 Cefepime 10/15 zosyn 10/16 10/16 Unasyn >> 10/17  Cefazolin 10/17 >> Flagyl 10/17 >>  Anti-infectives (From admission, onward)   Start     Dose/Rate Route Frequency Ordered Stop   07/30/19 1515  ceFAZolin (ANCEF) IVPB 2g/100 mL premix  Status:  Discontinued     2 g 200 mL/hr over 30 Minutes Intravenous Every 8 hours 07/30/19 1505 08/05/19 1128   07/30/19 1515  metroNIDAZOLE (FLAGYL) IVPB 500 mg  Status:  Discontinued     500 mg 100 mL/hr over 60 Minutes Intravenous Every 8 hours 07/30/19 1505 08/01/19 1804   07/29/19 1800  Ampicillin-Sulbactam (UNASYN) 3 g in sodium chloride 0.9 % 100 mL IVPB  Status:  Discontinued     3 g 200 mL/hr over 30 Minutes Intravenous Every 6 hours 07/29/19 1125 07/30/19 1505   07/29/19 1200  ceFEPIme (MAXIPIME) 2 g in sodium chloride 0.9 % 100 mL IVPB  Status:  Discontinued     2 g 200 mL/hr over 30 Minutes Intravenous Every 8 hours 07/29/19 0836 07/29/19 1125   07/29/19 0930  vancomycin (VANCOCIN) 1,250 mg in sodium chloride 0.9 % 250 mL IVPB  Status:  Discontinued     1,250 mg 166.7 mL/hr over 90 Minutes Intravenous Every 24 hours 07/29/19 0840 07/30/19 1505   07/28/19 2200  piperacillin-tazobactam (ZOSYN) IVPB 3.375 g  Status:  Discontinued     3.375 g 12.5 mL/hr over 240 Minutes Intravenous Every 8 hours  07/28/19 0528 07/28/19 0841   07/28/19 1600  ceFEPIme (MAXIPIME) 2 g in sodium chloride 0.9 % 100 mL IVPB  Status:  Discontinued     2 g 200 mL/hr over 30 Minutes Intravenous Every 12 hours 07/28/19 0841 07/29/19 0836   07/28/19 0527  vancomycin variable dose per unstable renal function (pharmacist dosing)  Status:  Discontinued      Does not apply See admin  instructions 07/28/19 0528 07/28/19 1017   07/28/19 0430  ampicillin-sulbactam (UNASYN) 1.5 g in sodium chloride 0.9 % 100 mL IVPB  Status:  Discontinued     1.5 g 200 mL/hr over 30 Minutes Intravenous  Once 07/28/19 0421 07/28/19 0516   07/28/19 0245  vancomycin (VANCOCIN) IVPB 1000 mg/200 mL premix     1,000 mg 200 mL/hr over 60 Minutes Intravenous  Once 07/28/19 0240 07/28/19 0435   07/28/19 0245  ceFEPIme (MAXIPIME) 2 g in sodium chloride 0.9 % 100 mL IVPB     2 g 200 mL/hr over 30 Minutes Intravenous  Once 07/28/19 0240 07/28/19 0403     Subjective:  Gasping raspy breaths doesn't appear comfortable    Objective: Vitals:   08/05/19 1124 08/05/19 1200  BP:    Pulse:  81  Resp:    Temp:    SpO2: (!) 82% (!) 86%    Intake/Output Summary (Last 24 hours) at 08/05/2019 1557 Last data filed at 08/05/2019 1000 Gross per 24 hour  Intake 927.33 ml  Output 1850 ml  Net -922.67 ml   Filed Weights   08/02/19 0312 08/03/19 0405 08/04/19 0328  Weight: 59.4 kg 52.7 kg 49.7 kg   Body mass index is 20.7 kg/m.   Physical Exam:  tachypneoic drty mucosa Crusting of mouth cta b abd soft scaphoid Incoherent but alert Data Review: I have personally reviewed the following laboratory data and studies,  CBC: Recent Labs  Lab 07/30/19 0434 07/31/19 0601 08/01/19 0441 08/03/19 0459 08/04/19 0303  WBC 27.6* 12.1* 8.2 6.6 6.6  NEUTROABS  --   --   --   --  4.8  HGB 9.3* 9.3* 10.0* 10.1* 9.7*  HCT 30.1* 28.3* 30.6* 31.2* 29.8*  MCV 102.7* 101.4* 97.8 96.3 96.1  PLT 328 236 244 239 208   Basic Metabolic Panel: Recent Labs  Lab 07/30/19 0434 07/31/19 0601 08/01/19 0441 08/02/19 0205 08/02/19 1938 08/03/19 0459 08/04/19 0303 08/04/19 0708  NA 143 145 143 141 140 140 139  --   K 3.5 3.3* 3.3* 2.5* 3.5 3.1* 2.7*  --   CL 114* 110 109 104 107 107 106  --   CO2 21* --   GLUCOSE 138* 96 102* 94 81 91 107*  --   BUN 5* <5*  --   CREATININE  0.92 0.74 0.74 0.71 0.74 0.90 0.71  --   CALCIUM 7.5* 7.7* 8.0* 8.0* 7.9* 7.9* 7.7*  --   MG 2.1 1.9 2.0  --   --  2.0  --  2.0  PHOS 1.6* 2.4* 2.2*  --   --   --  2.5  --    Liver Function Tests: Recent Labs  Lab 08/04/19 0303  ALBUMIN 1.8*   No results for input(s): LIPASE, AMYLASE in the last 168 hours. No results for input(s): AMMONIA in the last 168 hours. Cardiac Enzymes: No results for input(s): CKTOTAL, CKMB, CKMBINDEX, TROPONINI in the last 168 hours. BNP (last 3 results) No results for  input(s): BNP in the last 8760 hours.  ProBNP (last 3 results) No results for input(s): PROBNP in the last 8760 hours.  CBG: Recent Labs  Lab 08/03/19 1929 08/03/19 2308 08/04/19 0321 08/04/19 0729 08/04/19 1119  GLUCAP 102* 116* 105* 99 106*   Recent Results (from the past 240 hour(s))  Blood Culture (routine x 2)     Status: Abnormal   Collection Time: 07/28/19  2:52 AM   Specimen: BLOOD  Result Value Ref Range Status   Specimen Description BLOOD LEFT ANTECUBITAL  Final   Special Requests   Final    BOTTLES DRAWN AEROBIC AND ANAEROBIC Blood Culture adequate volume   Culture  Setup Time   Final    IN BOTH AEROBIC AND ANAEROBIC BOTTLES GRAM POSITIVE COCCI CRITICAL RESULT CALLED TO, READ BACK BY AND VERIFIED WITH: L[facility]le [facility] 07/28/19 1746 JDW Performed at [facility], [address], [place], [place] [address]    Culture STAPHYLOCOCCUS AUREUS STREPTOCOCCUS AGALACTIAE  (A)  Final   Report Status 07/30/2019 FINAL  Final   Organism ID, Bacteria STAPHYLOCOCCUS AUREUS  Final   Organism ID, Bacteria STREPTOCOCCUS AGALACTIAE  Final      Susceptibility   Streptococcus agalactiae - MIC*    CLINDAMYCIN >=1 RESISTANT Resistant     AMPICILLIN <=0.25 SENSITIVE Sensitive     ERYTHROMYCIN >=8 RESISTANT Resistant     VANCOMYCIN 0.5 SENSITIVE Sensitive     CEFTRIAXONE <=0.12 SENSITIVE Sensitive     LEVOFLOXACIN 1 SENSITIVE Sensitive     * STREPTOCOCCUS AGALACTIAE    Staphylococcus aureus - MIC*    CIPROFLOXACIN <=0.5 SENSITIVE Sensitive     ERYTHROMYCIN >=8 RESISTANT Resistant     GENTAMICIN <=0.5 SENSITIVE Sensitive     OXACILLIN 0.5 SENSITIVE Sensitive     TETRACYCLINE <=1 SENSITIVE Sensitive     VANCOMYCIN <=0.5 SENSITIVE Sensitive     TRIMETH/SULFA <=10 SENSITIVE Sensitive     CLINDAMYCIN RESISTANT Resistant     RIFAMPIN <=0.5 SENSITIVE Sensitive     Inducible Clindamycin POSITIVE Resistant     * STAPHYLOCOCCUS AUREUS  Blood Culture (routine x 2)     Status: None   Collection Time: 07/28/19  3:05 AM   Specimen: BLOOD LEFT HAND  Result Value Ref Range Status   Specimen Description BLOOD LEFT HAND  Final   Special Requests   Final    BOTTLES DRAWN AEROBIC AND ANAEROBIC Blood Culture adequate volume   Culture   Final    NO GROWTH 5 DAYS Performed at [facility], [address] [place], [place] [address]    Report Status 08/02/2019 FINAL  Final  SARS Coronavirus 2 by RT PCR (hospital order, performed in [facility] hospital lab) Nasopharyngeal Nasopharyngeal Swab     Status: None   Collection Time: 07/28/19  3:28 AM   Specimen: Nasopharyngeal Swab  Result Value Ref Range Status   SARS Coronavirus 2 NEGATIVE NEGATIVE Final    Comment: (NOTE) If result is NEGATIVE SARS-CoV-2 target nucleic acids are NOT DETECTED. The SARS-CoV-2 RNA is generally detectable in upper and lower  respiratory specimens during the acute phase of infection. The lowest  concentration of SARS-CoV-2 viral copies this assay can detect is 250  copies / mL. A negative result does not preclude SARS-CoV-2 infection  and should not be used as the sole basis for treatment or other  patient management decisions.  A negative result may occur with  improper specimen collection / handling, submission of specimen other  than nasopharyngeal swab, presence of viral mutation(s) within the  areas targeted by this assay, and inadequate number of viral copies  (<250 copies /  mL). A negative result must be combined with clinical  observations, patient history, and epidemiological information. If result is POSITIVE SARS-CoV-2 target nucleic acids are DETECTED. The SARS-CoV-2 RNA is generally detectable in upper and lower  respiratory specimens dur ing the acute phase of infection.  Positive  results are indicative of active infection with SARS-CoV-2.  Clinical  correlation with patient history and other diagnostic information is  necessary to determine patient infection status.  Positive results do  not rule out bacterial infection or co-infection with other viruses. If result is PRESUMPTIVE POSTIVE SARS-CoV-2 nucleic acids MAY BE PRESENT.   A presumptive positive result was obtained on the submitted specimen  and confirmed on repeat testing.  While 2019 novel coronavirus  (SARS-CoV-2) nucleic acids may be present in the submitted sample  additional confirmatory testing may be necessary for epidemiological  and / or clinical management purposes  to differentiate between  SARS-CoV-2 and other Sarbecovirus currently known to infect humans.  If clinically indicated additional testing with an alternate test  methodology [phone]) is advised. The SARS-CoV-2 RNA is generally  detectable in upper and lower respiratory sp ecimens during the acute  phase of infection. The expected result is Negative. Fact Sheet for Patients:  StrictlyIdeas.no Fact Sheet for Healthcare Providers: BankingDealers.co.za This test is not yet approved or cleared by the [place] FDA and has been authorized for detection and/or diagnosis of SARS-CoV-2 by FDA under an Emergency Use Authorization (EUA).  This EUA will remain in effect (meaning this test can be used) for the duration of the COVID-19 declaration under Section 564(b)(1) of the Act, 21 U.S.C. section 360bbb-3(b)(1), unless the authorization is terminated or revoked sooner.  Performed at [facility], [place] [address], [place], [place] [address]   MRSA PCR Screening     Status: None   Collection Time: 07/28/19  7:25 AM   Specimen: Nasopharyngeal  Result Value Ref Range Status   MRSA by PCR NEGATIVE NEGATIVE Final    Comment:        The GeneXpert MRSA Assay (FDA approved for NASAL specimens only), is one component of a comprehensive MRSA colonization surveillance program. It is not intended to diagnose MRSA infection nor to guide or monitor treatment for MRSA infections. Performed at [facility], [place] [address], [place], [place] [address]   Urine culture     Status: None   Collection Time: 07/30/19  9:24 AM   Specimen: In/Out Cath Urine  Result Value Ref Range Status   Specimen Description IN/OUT CATH URINE  Final   Special Requests NONE  Final   Culture   Final    NO GROWTH Performed at [facility], [place] [address], [place], [place] [address]    Report Status 07/31/2019 FINAL  Final  Culture, blood (Routine X 2) w Reflex to ID Panel     Status: None   Collection Time: 07/30/19  9:50 AM   Specimen: BLOOD RIGHT HAND  Result Value Ref Range Status   Specimen Description BLOOD RIGHT HAND  Final   Special Requests   Final    BOTTLES DRAWN AEROBIC ONLY Blood Culture adequate volume   Culture   Final    NO GROWTH 5 DAYS Performed at [facility], [place] [address] [place], [place] [address]    Report Status  08/04/2019 FINAL  Final  Culture, blood (Routine X 2) w Reflex to ID Panel     Status: None   Collection Time: 07/30/19 10:00 AM   Specimen: BLOOD LEFT HAND  Result Value Ref Range Status   Specimen Description BLOOD LEFT HAND  Final   Special Requests   Final    BOTTLES DRAWN AEROBIC ONLY Blood Culture results may not be optimal due to an inadequate volume of blood received in culture bottles   Culture   Final    NO GROWTH 5 DAYS Performed at [facility], [address] [place], [place] [address]    Report  Status 08/04/2019 FINAL  Final  Culture, respiratory (tracheal aspirate)     Status: None   Collection Time: 07/30/19 11:21 AM   Specimen: Tracheal Aspirate; Respiratory  Result Value Ref Range Status   Specimen Description TRACHEAL ASPIRATE  Final   Special Requests NONE  Final   Gram Stain   Final    NO WBC SEEN NO ORGANISMS SEEN Performed at [facility], [address] [place], [place] [address]    Culture FEW STAPHYLOCOCCUS AUREUS  Final   Report Status 08/02/2019 FINAL  Final   Organism ID, Bacteria STAPHYLOCOCCUS AUREUS  Final      Susceptibility   Staphylococcus aureus - MIC*    CIPROFLOXACIN <=0.5 SENSITIVE Sensitive     ERYTHROMYCIN >=8 RESISTANT Resistant     GENTAMICIN <=0.5 SENSITIVE Sensitive     OXACILLIN 0.5 SENSITIVE Sensitive     TETRACYCLINE <=1 SENSITIVE Sensitive     VANCOMYCIN 1 SENSITIVE Sensitive     TRIMETH/SULFA <=10 SENSITIVE Sensitive     CLINDAMYCIN RESISTANT Resistant     RIFAMPIN <=0.5 SENSITIVE Sensitive     Inducible Clindamycin POSITIVE Resistant     * FEW STAPHYLOCOCCUS AUREUS     Studies: No results found. 10/15 XR b/l hip >> 1.  Comminuted periprosthetic fracture/re-injury of the left femoral neck and intertrochanteric region with large effusion and overlying swelling. Transcervical fixation hardware stands proud of the cortex. 2.  Normal location of the right hip hemiarthroplasty with recent postsurgical changes. Alignment is suboptimally evaluated on these nondedicated images and given patient rotation.  Echo 10/17-LVEF 60 to 65%, moderate reduced RV systolic function. No obvious vegetation.   Scheduled Meds: . collagenase   Topical Daily  .  morphine injection  2 mg Intravenous Once  . scopolamine  1 patch Transdermal Q72H    Continuous Infusions: . morphine       Rhetta Mura, MD  [facility] 08/05/2019

## 2019-08-05 NOTE — Progress Notes (Signed)
RT NOTE: RT attempted but was unable to give nebulizer treatment. Patient became extremely agitated and was swatting and hitting RT when tried to place mask on face. CPT given through bed but patient would not take nebulizer. Vitals are stable. RT will continue to monitor.

## 2019-08-05 NOTE — Progress Notes (Signed)
Family is here at bedside and now wishes to take patient off oxygen and make a FULL comfort care patient. Wishes to make patient comfortable, will page [facility] regarding pain medications. Modena Morrow E, RN 08/05/2019 11:24 AM

## 2019-08-05 NOTE — Care Management (Addendum)
CSW received consult for discharge to [facility].  CM called in referral to [facility] with [place] will follow up with CM regarding acceptance and potential beds  Update 1645:  Judeen Hammans reached out to daughter and informed that a bed is not available today - agency will follow up over the weekend

## 2019-08-06 MED ORDER — WHITE PETROLATUM EX OINT
TOPICAL_OINTMENT | CUTANEOUS | Status: AC
  Administered 2019-08-06: 0.2
  Filled 2019-08-06: qty 28.35

## 2019-08-06 NOTE — Progress Notes (Addendum)
Manufacturing engineer [facility])  [facility] does not have a bed to offer today.  TOC manager and family aware.  Thank you, Venia Carbon RN, BSN, [facility] Liaison (in [place]) [phone]  **Addendum 330pm, [facility] will have a bed for Mr. Demicco in the morning (3/25).  [facility] manager aware, will update once consents are complete so transport can be arranged.

## 2019-08-06 NOTE — Progress Notes (Addendum)
PROGRESS NOTE  Brian Stanton CWC:[id number] DOB: July 26, 1961 DOA: 07/28/2019 PCP: Default, Provider, MD   LOS: 9 days  Brief narrative: 41 yoM with hx of Down Syndrome from rehab/ SNF with N/V/D found to have septic shock with aspiration pneumonia and requiring intubation for hypoxic respiratory failure.  Additionally, he had AKI and refracture of left hip nailing from recent hip surgery in September. Recently hospitalized 9/22- 9/29 for bilateral hip fractures (left appearing more acute/ right more chronic) and underwent left Biomet now short trochanteric nail with interlocking and the right hip bipolar hemiarthroplasty done by Dr. Lorin Mercy on 9/23. Additionally, treated for possible RLL pneumonia with ceftriaxone and azithromycin.  He was discharged to SNF/ rehab.  Transferred to medical service from ICU on 08/02/2019. Made comfort care 10/22  Assessment/Plan:  Active Problems:   Acute respiratory failure (HCC)   Pressure injury of skin   Protein-calorie malnutrition, severe   Healthcare-associated pneumonia   Scrotal lesion   Bacteremia due to Staphylococcus aureus   Bacteremia due to Streptococcus   Goals of care, counseling/discussion   Staphylococcal pneumonia ([place])   Palliative care by specialist   Comfort measures only status  Acute respiratory failure, aspiration- staph pneumonia expect poor outcome Gasping breaths at bedside Transition to morphine [facility] facility placement MSSA, group B strep bacteremia infectious disease on board-abx stopped 10/22 Goals of care discussion Code status is full DNR Agitation, combativeness refusal to eat. D/w Vaughan Basta sister and she is agreeable to full comfort Hypokalemia. Scrotal abscess.  Marland Kitchen  VTE Prophylaxis: Heparin subcu Code Status: DNR Family Communication:no family present Disposition Plan: Unknown at this time.  Social worker on board.  MICRO Data 10/15 SARS CoV2 >> neg 10/15 BC x 2 >> Staph Aureus, Streptococcus  agalactiae 10/15 UC >> 10/15 trach asp >>  Consultants: Ortho  Procedures: 10/17-self extubated.  Did not require reintubation 10/15 ETT >> 10/18 10/16 Lt IJ CVL >>  Antibiotics:  10/15 Vanco >> 10/17 10/15 Cefepime 10/15 zosyn 10/16 10/16 Unasyn >> 10/17  Cefazolin 10/17 >> Flagyl 10/17 >>  Anti-infectives (From admission, onward)   Start     Dose/Rate Route Frequency Ordered Stop   07/30/19 1515  ceFAZolin (ANCEF) IVPB 2g/100 mL premix  Status:  Discontinued     2 g 200 mL/hr over 30 Minutes Intravenous Every 8 hours 07/30/19 1505 08/05/19 1128   07/30/19 1515  metroNIDAZOLE (FLAGYL) IVPB 500 mg  Status:  Discontinued     500 mg 100 mL/hr over 60 Minutes Intravenous Every 8 hours 07/30/19 1505 08/01/19 1804   07/29/19 1800  Ampicillin-Sulbactam (UNASYN) 3 g in sodium chloride 0.9 % 100 mL IVPB  Status:  Discontinued     3 g 200 mL/hr over 30 Minutes Intravenous Every 6 hours 07/29/19 1125 07/30/19 1505   07/29/19 1200  ceFEPIme (MAXIPIME) 2 g in sodium chloride 0.9 % 100 mL IVPB  Status:  Discontinued     2 g 200 mL/hr over 30 Minutes Intravenous Every 8 hours 07/29/19 0836 07/29/19 1125   07/29/19 0930  vancomycin (VANCOCIN) 1,250 mg in sodium chloride 0.9 % 250 mL IVPB  Status:  Discontinued     1,250 mg 166.7 mL/hr over 90 Minutes Intravenous Every 24 hours 07/29/19 0840 07/30/19 1505   07/28/19 2200  piperacillin-tazobactam (ZOSYN) IVPB 3.375 g  Status:  Discontinued     3.375 g 12.5 mL/hr over 240 Minutes Intravenous Every 8 hours 07/28/19 0528 07/28/19 0841   07/28/19 1600  ceFEPIme (MAXIPIME) 2 g  in sodium chloride 0.9 % 100 mL IVPB  Status:  Discontinued     2 g 200 mL/hr over 30 Minutes Intravenous Every 12 hours 07/28/19 0841 07/29/19 0836   07/28/19 0527  vancomycin variable dose per unstable renal function (pharmacist dosing)  Status:  Discontinued      Does not apply See admin instructions 07/28/19 0528 07/28/19 1017   07/28/19 0430  ampicillin-sulbactam  (UNASYN) 1.5 g in sodium chloride 0.9 % 100 mL IVPB  Status:  Discontinued     1.5 g 200 mL/hr over 30 Minutes Intravenous  Once 07/28/19 0421 07/28/19 0516   07/28/19 0245  vancomycin (VANCOCIN) IVPB 1000 mg/200 mL premix     1,000 mg 200 mL/hr over 60 Minutes Intravenous  Once 07/28/19 0240 07/28/19 0435   07/28/19 0245  ceFEPIme (MAXIPIME) 2 g in sodium chloride 0.9 % 100 mL IVPB     2 g 200 mL/hr over 30 Minutes Intravenous  Once 07/28/19 0240 07/28/19 0403     Subjective:  More comfortable no other concerns   Objective: Vitals:   08/05/19 1124 08/05/19 1200  BP:    Pulse:  81  Resp:    Temp:    SpO2: (!) 82% (!) 86%    Intake/Output Summary (Last 24 hours) at 08/06/2019 1417 Last data filed at 08/06/2019 0358 Gross per 24 hour  Intake 0 ml  Output 1650 ml  Net -1650 ml   Filed Weights   08/02/19 0312 08/03/19 0405 08/04/19 0328  Weight: 59.4 kg 52.7 kg 49.7 kg   Body mass index is 20.7 kg/m.   Physical Exam:  tachypneic Awakens some but overall sleepy More comfortable appearing today  Data Review: I have personally reviewed the following laboratory data and studies,  CBC: Recent Labs  Lab 07/31/19 0601 08/01/19 0441 08/03/19 0459 08/04/19 0303  WBC 12.1* 8.2 6.6 6.6  NEUTROABS  --   --   --  4.8  HGB 9.3* 10.0* 10.1* 9.7*  HCT 28.3* 30.6* 31.2* 29.8*  MCV 101.4* 97.8 96.3 96.1  PLT 236 244 239 208   Basic Metabolic Panel: Recent Labs  Lab 07/31/19 0601 08/01/19 0441 08/02/19 0205 08/02/19 1938 08/03/19 0459 08/04/19 0303 08/04/19 0708  NA 145 143 141 140 140 139  --   K 3.3* 3.3* 2.5* 3.5 3.1* 2.7*  --   CL 110 109 104 107 107 106  --   CO2 27 28 26 23 25 24   --   GLUCOSE 96 102* 94 81 91 107*  --   BUN 10 7 6 6  5* <5*  --   CREATININE 0.74 0.74 0.71 0.74 0.90 0.71  --   CALCIUM 7.7* 8.0* 8.0* 7.9* 7.9* 7.7*  --   MG 1.9 2.0  --   --  2.0  --  2.0  PHOS 2.4* 2.2*  --   --   --  2.5  --    Liver Function Tests: Recent Labs  Lab  08/04/19 0303  ALBUMIN 1.8*   No results for input(s): LIPASE, AMYLASE in the last 168 hours. No results for input(s): AMMONIA in the last 168 hours. Cardiac Enzymes: No results for input(s): CKTOTAL, CKMB, CKMBINDEX, TROPONINI in the last 168 hours. BNP (last 3 results) No results for input(s): BNP in the last 8760 hours.  ProBNP (last 3 results) No results for input(s): PROBNP in the last 8760 hours.  CBG: Recent Labs  Lab 08/03/19 1929 08/03/19 2308 08/04/19 0321 08/04/19 0729 08/04/19 1119  GLUCAP  102* 116* 105* 99 106*   Recent Results (from the past 240 hour(s))  Blood Culture (routine x 2)     Status: Abnormal   Collection Time: 07/28/19  2:52 AM   Specimen: BLOOD  Result Value Ref Range Status   Specimen Description BLOOD LEFT ANTECUBITAL  Final   Special Requests   Final    BOTTLES DRAWN AEROBIC AND ANAEROBIC Blood Culture adequate volume   Culture  Setup Time   Final    IN BOTH AEROBIC AND ANAEROBIC BOTTLES GRAM POSITIVE COCCI CRITICAL RESULT CALLED TO, READ BACK BY AND VERIFIED WITH: [facility] 07/28/19 1746 JDW Performed at [facility], [address] [place], [place] [address]    Culture STAPHYLOCOCCUS AUREUS STREPTOCOCCUS AGALACTIAE  (A)  Final   Report Status 07/30/2019 FINAL  Final   Organism ID, Bacteria STAPHYLOCOCCUS AUREUS  Final   Organism ID, Bacteria STREPTOCOCCUS AGALACTIAE  Final      Susceptibility   Streptococcus agalactiae - MIC*    CLINDAMYCIN >=1 RESISTANT Resistant     AMPICILLIN <=0.25 SENSITIVE Sensitive     ERYTHROMYCIN >=8 RESISTANT Resistant     VANCOMYCIN 0.5 SENSITIVE Sensitive     CEFTRIAXONE <=0.12 SENSITIVE Sensitive     LEVOFLOXACIN 1 SENSITIVE Sensitive     * STREPTOCOCCUS AGALACTIAE   Staphylococcus aureus - MIC*    CIPROFLOXACIN <=0.5 SENSITIVE Sensitive     ERYTHROMYCIN >=8 RESISTANT Resistant     GENTAMICIN <=0.5 SENSITIVE Sensitive     OXACILLIN 0.5 SENSITIVE Sensitive     TETRACYCLINE <=1 SENSITIVE  Sensitive     VANCOMYCIN <=0.5 SENSITIVE Sensitive     TRIMETH/SULFA <=10 SENSITIVE Sensitive     CLINDAMYCIN RESISTANT Resistant     RIFAMPIN <=0.5 SENSITIVE Sensitive     Inducible Clindamycin POSITIVE Resistant     * STAPHYLOCOCCUS AUREUS  Blood Culture (routine x 2)     Status: None   Collection Time: 07/28/19  3:05 AM   Specimen: BLOOD LEFT HAND  Result Value Ref Range Status   Specimen Description BLOOD LEFT HAND  Final   Special Requests   Final    BOTTLES DRAWN AEROBIC AND ANAEROBIC Blood Culture adequate volume   Culture   Final    NO GROWTH 5 DAYS Performed at [facility], [address], [place], [place] [address]    Report Status 08/02/2019 FINAL  Final  SARS Coronavirus 2 by RT PCR (hospital order, performed in [facility] hospital lab) Nasopharyngeal Nasopharyngeal Swab     Status: None   Collection Time: 07/28/19  3:28 AM   Specimen: Nasopharyngeal Swab  Result Value Ref Range Status   SARS Coronavirus 2 NEGATIVE NEGATIVE Final    Comment: (NOTE) If result is NEGATIVE SARS-CoV-2 target nucleic acids are NOT DETECTED. The SARS-CoV-2 RNA is generally detectable in upper and lower  respiratory specimens during the acute phase of infection. The lowest  concentration of SARS-CoV-2 viral copies this assay can detect is 250  copies / mL. A negative result does not preclude SARS-CoV-2 infection  and should not be used as the sole basis for treatment or other  patient management decisions.  A negative result may occur with  improper specimen collection / handling, submission of specimen other  than nasopharyngeal swab, presence of viral mutation(s) within the  areas targeted by this assay, and inadequate number of viral copies  (<250 copies / mL). A negative result must be combined with clinical  observations, patient history, and  epidemiological information. If result is POSITIVE SARS-CoV-2 target nucleic acids are DETECTED. The SARS-CoV-2 RNA is generally  detectable in upper and lower  respiratory specimens dur ing the acute phase of infection.  Positive  results are indicative of active infection with SARS-CoV-2.  Clinical  correlation with patient history and other diagnostic information is  necessary to determine patient infection status.  Positive results do  not rule out bacterial infection or co-infection with other viruses. If result is PRESUMPTIVE POSTIVE SARS-CoV-2 nucleic acids MAY BE PRESENT.   A presumptive positive result was obtained on the submitted specimen  and confirmed on repeat testing.  While 2019 novel coronavirus  (SARS-CoV-2) nucleic acids may be present in the submitted sample  additional confirmatory testing may be necessary for epidemiological  and / or clinical management purposes  to differentiate between  SARS-CoV-2 and other Sarbecovirus currently known to infect humans.  If clinically indicated additional testing with an alternate test  methodology [phone] is advised. The SARS-CoV-2 RNA is generally  detectable in upper and lower respiratory sp ecimens during the acute  phase of infection. The expected result is Negative. Fact Sheet for Patients:  BoilerBrush.com.cyhttps://www.fda.gov/media/136312/download Fact Sheet for Healthcare Providers: https://pope.com/https://www.fda.gov/media/136313/download This test is not yet approved or cleared by the [place] FDA and has been authorized for detection and/or diagnosis of SARS-CoV-2 by FDA under an Emergency Use Authorization (EUA).  This EUA will remain in effect (meaning this test can be used) for the duration of the COVID-19 declaration under Section 564(b)(1) of the Act, 21 U.S.C. section 360bbb-3(b)(1), unless the authorization is terminated or revoked sooner. Performed at [facility], [address] [place], [place] [address]   MRSA PCR Screening     Status: None   Collection Time: 07/28/19  7:25 AM   Specimen: Nasopharyngeal  Result Value Ref Range Status   MRSA by  PCR NEGATIVE NEGATIVE Final    Comment:        The GeneXpert MRSA Assay (FDA approved for NASAL specimens only), is one component of a comprehensive MRSA colonization surveillance program. It is not intended to diagnose MRSA infection nor to guide or monitor treatment for MRSA infections. Performed at [facility], [address] [place], [place] [address]   Urine culture     Status: None   Collection Time: 07/30/19  9:24 AM   Specimen: In/Out Cath Urine  Result Value Ref Range Status   Specimen Description IN/OUT CATH URINE  Final   Special Requests NONE  Final   Culture   Final    NO GROWTH Performed at [facility], [address] [place], [place] [address]    Report Status 07/31/2019 FINAL  Final  Culture, blood (Routine X 2) w Reflex to ID Panel     Status: None   Collection Time: 07/30/19  9:50 AM   Specimen: BLOOD RIGHT HAND  Result Value Ref Range Status   Specimen Description BLOOD RIGHT HAND  Final   Special Requests   Final    BOTTLES DRAWN AEROBIC ONLY Blood Culture adequate volume   Culture   Final    NO GROWTH 5 DAYS Performed at [facility], [address] [place], [place] [address]    Report Status 08/04/2019 FINAL  Final  Culture, blood (Routine X 2) w Reflex to ID Panel     Status: None   Collection Time: 07/30/19 10:00 AM   Specimen: BLOOD LEFT HAND  Result Value Ref Range  Status   Specimen Description BLOOD LEFT HAND  Final   Special Requests   Final    BOTTLES DRAWN AEROBIC ONLY Blood Culture results may not be optimal due to an inadequate volume of blood received in culture bottles   Culture   Final    NO GROWTH 5 DAYS Performed at [facility], [address] [place], [place] [address]    Report Status 08/04/2019 FINAL  Final  Culture, respiratory (tracheal aspirate)     Status: None   Collection Time: 07/30/19 11:21 AM   Specimen: Tracheal Aspirate; Respiratory  Result Value Ref Range Status   Specimen  Description TRACHEAL ASPIRATE  Final   Special Requests NONE  Final   Gram Stain   Final    NO WBC SEEN NO ORGANISMS SEEN Performed at [facility], [address] [place], [place] [address]    Culture FEW STAPHYLOCOCCUS AUREUS  Final   Report Status 08/02/2019 FINAL  Final   Organism ID, Bacteria STAPHYLOCOCCUS AUREUS  Final      Susceptibility   Staphylococcus aureus - MIC*    CIPROFLOXACIN <=0.5 SENSITIVE Sensitive     ERYTHROMYCIN >=8 RESISTANT Resistant     GENTAMICIN <=0.5 SENSITIVE Sensitive     OXACILLIN 0.5 SENSITIVE Sensitive     TETRACYCLINE <=1 SENSITIVE Sensitive     VANCOMYCIN 1 SENSITIVE Sensitive     TRIMETH/SULFA <=10 SENSITIVE Sensitive     CLINDAMYCIN RESISTANT Resistant     RIFAMPIN <=0.5 SENSITIVE Sensitive     Inducible Clindamycin POSITIVE Resistant     * FEW STAPHYLOCOCCUS AUREUS     Studies: No results found. 10/15 XR b/l hip >> 1.  Comminuted periprosthetic fracture/re-injury of the left femoral neck and intertrochanteric region with large effusion and overlying swelling. Transcervical fixation hardware stands proud of the cortex. 2.  Normal location of the right hip hemiarthroplasty with recent postsurgical changes. Alignment is suboptimally evaluated on these nondedicated images and given patient rotation.  Echo 10/17-LVEF 60 to 65%, moderate reduced RV systolic function. No obvious vegetation.   Scheduled Meds: . collagenase   Topical Daily  . scopolamine  1 patch Transdermal Q72H    Continuous Infusions: . morphine 1 mg/hr (08/05/19 1631)     Rhetta Mura, MD  [facility] 08/06/2019

## 2019-08-07 MED ORDER — LORAZEPAM 2 MG/ML PO CONC: 1.0000 mg | ORAL | 0 refills | Status: AC | PRN

## 2019-08-07 MED ORDER — SCOPOLAMINE 1 MG/3DAYS TD PT72: 1.0000 | MEDICATED_PATCH | TRANSDERMAL | 12 refills | Status: AC

## 2019-08-07 MED ORDER — MORPHINE 100MG IN NS 100ML (1MG/ML) PREMIX INFUSION: 1.0000 mg/h | INTRAVENOUS | 0 refills | Status: AC

## 2019-08-07 NOTE — Discharge Summary (Signed)
Physician Discharge Summary  Brian Stanton EAV:[id number] DOB: 05/04/1961 DOA: 07/28/2019  PCP: Default, Provider, MD  Admit date: 07/28/2019 Discharge date: 08/07/2019  Time spent: 20 minutes  Recommendations for Outpatient Follow-up:  1. Patient transferring to [facility] for end-of-life care  Discharge Diagnoses:  Active Problems:   Acute respiratory failure (HCC)   Pressure injury of skin   Protein-calorie malnutrition, severe   Healthcare-associated pneumonia   Scrotal lesion   Bacteremia due to Staphylococcus aureus   Bacteremia due to Streptococcus   Goals of care, counseling/discussion   Staphylococcal pneumonia (HCC)   Palliative care by specialist   Comfort measures only status   Discharge Condition: Guarded  Diet recommendation: Comfort  Filed Weights   08/02/19 0312 08/03/19 0405 08/04/19 0328  Weight: 59.4 kg 52.7 kg 49.7 kg    History of present illness:  84 white 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 Admitted after having nausea vomiting diarrhea and found to have septic shock which required intubation also had recent left hip nailing recent hip surgery September 2020 and was hospitalized for the same He was initially on the ICU service and was transferred to the medical service-he was found to have staph pneumo bacteremia in addition to respiratory failure secondary to staphylococcal pneumonia-infectious disease was consulted with regards to the same Electrolytes were initially aggressively managed however after Goals of care were delineated by palliative medicine it was decided to make him full comfort care He was transitioned to morphine GTT, glycopyrrolate and felt that because of his poor prognosis he would be a good candidate for [facility] and he was transferred there for end-of-life care I have tried to contact his sister today but have not been able to   Discharge Exam: Vitals:   08/05/19 1124 08/05/19 1200  BP:    Pulse:  81  Resp:     Temp:    SpO2: (!) 82% (!) 86%    General: Awake gasping respirations does move around a little bit in the bed Cardiovascular: S1-S2 no murmur rub or gallop Respiratory: Clear Incoherent neurologically  Discharge Instructions   Discharge Instructions    Diet - low sodium heart healthy   Complete by: As directed    Increase activity slowly   Complete by: As directed      Allergies as of 08/07/2019   No Known Allergies     Medication List    STOP taking these medications   ALIGN PO   aspirin 325 MG EC tablet   BABY SHAMPOO EX   BLISTEX LIP BALM EX   cholecalciferol 25 MCG (1000 UT) tablet Commonly known as: VITAMIN D3   docusate sodium 100 MG capsule Commonly known as: COLACE   donepezil 5 MG tablet Commonly known as: ARICEPT   Ensure   guaiFENesin 600 MG 12 hr tablet Commonly known as: MUCINEX   hydrocortisone 25 MG suppository Commonly known as: ANUSOL-HC   iron polysaccharides 150 MG capsule Commonly known as: NIFEREX   levothyroxine 88 MCG tablet Commonly known as: SYNTHROID   lubiprostone 24 MCG capsule Commonly known as: AMITIZA   Melatonin 3 MG Tabs   Myrbetriq 25 MG Tb24 tablet Generic drug: mirabegron ER   Namenda XR 28 MG Cp24 24 hr capsule Generic drug: memantine   ondansetron 4 MG tablet Commonly known as: ZOFRAN   promethazine 25 MG suppository Commonly known as: PHENERGAN   silver sulfADIAZINE 1 % cream Commonly known as: SILVADENE   traMADol 50 MG tablet Commonly known as: Janean Sark  traZODone 50 MG tablet Commonly known as: DESYREL     TAKE these medications   acetaminophen 325 MG tablet Commonly known as: TYLENOL Take 2 tablets (650 mg total) by mouth every 6 (six) hours as needed for mild pain. What changed: when to take this   LORazepam 2 MG/ML concentrated solution Commonly known as: ATIVAN Place 0.5 mLs (1 mg total) under the tongue every 4 (four) hours as needed for anxiety.   morphine 1 mg/mL Soln  infusion Inject 1-10 mg/hr into the vein continuous.   scopolamine 1 MG/3DAYS Commonly known as: TRANSDERM-SCOP Place 1 patch (1.5 mg total) onto the skin every 3 (three) days.      No Known Allergies    The results of significant diagnostics from this hospitalization (including imaging, microbiology, ancillary and laboratory) are listed below for reference.    Significant Diagnostic Studies: [place] Renal  Result Date: 07/08/2019 CLINICAL DATA:  Acute urinary retention EXAM: RENAL / URINARY TRACT ULTRASOUND COMPLETE COMPARISON:  CT 07/05/2019 FINDINGS: Right Kidney: Renal measurements: 9.2 x 3.5 x 6.7 cm = volume: 78.7 mL . Echogenicity within normal limits. No mass or hydronephrosis visualized. Left Kidney: Renal measurements: 11.0 x 8.6 x 4.2 cm = volume: 205.1 mL. Moderate to severe left hydronephrosis is stable since prior CT compatible with chronic left UPJ obstruction. Normal echotexture. Bladder: Mild wall thickening measuring up to 8 mm. 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 Mild bladder wall thickening could be related to chronic bladder outlet obstruction or cystitis. Recommend clinical correlation. Electronically Signed   By: Charlett NoseKevin  Dover M.D.   On: 07/08/2019 17:38   Ct Hip Left Wo Contrast  Result Date: 07/28/2019 CLINICAL DATA:  Left hip fracture diagnosed 07/05/2019. Mechanism of injury is unknown. Patient admitted with sepsis today. Plain film of the hip demonstrate a periprosthetic fracture. Subsequent encounter. EXAM: CT OF THE LEFT HIP WITHOUT CONTRAST TECHNIQUE: Multidetector CT imaging of the left hip was performed according to the standard protocol. Multiplanar CT image reconstructions were also generated. COMPARISON:  Plain films left hip earlier today intraoperative imaging 07/06/2019 FINDINGS: Bones/Joint/Cartilage Short intramedullary nail and hip screw are in place for fixation of a left hip fracture. The screw is  no longer within the femoral head but is directed an almost straight AP orientation. There is a fracture through the femoral neck at the site of screw placement. As seen on the prior plain films, the screw has backed out approximately 4.5 cm relative to the IM nail. There is angulation at the fracture site of approximately 90 degrees and foreshortening of the femur of approximately 4 cm. The greater trochanter is comminuted and medially and posteriorly displaced. Fracture line extends distally to the junction of the metaphysis and diaphysis of the femur. Ligaments Suboptimally assessed by CT. Muscles and Tendons There is extensive edema in the gluteal musculature and in the iliacus. Edema is also present in the right hip adductors. No focal fluid collection is identified. Soft tissues Extensive stranding is present subcutaneous tissues about the hip. Foley catheter in the urinary bladder noted. IMPRESSION: Status post fixation of a left intertrochanteric fracture. Hip screw is no longer within the femoral head and is now directed in an almost a straight AP orientation. Foreshortening of the left hip and fracture displacement are identified. The greater trochanter is comminuted and medially and posteriorly displaced. Extensive subcutaneous stranding about the visualized left hip and upper leg and multifocal muscle edema. Findings could be related to trauma  or surgery but infectious myositis and cellulitis are possible in this patient with sepsis. No intramuscular fluid collection is identified. Electronically Signed   By: Drusilla Kanner M.D.   On: 07/28/2019 13:15   Dg Chest Port 1 View  Result Date: 08/02/2019 CLINICAL DATA:  Hypoxia. EXAM: PORTABLE CHEST 1 VIEW COMPARISON:  07/30/2019 FINDINGS: Endotracheal tube, enteric tube, and left central line have been removed. Slight progression in diffuse airspace disease throughout the right lung in the central and lower lung zone predominant distribution. Right  pleural effusion grossly unchanged. Hazy opacity at the left lung base likely combination of pleural fluid and airspace disease/atelectasis. Unchanged heart size and mediastinal contours. Calcified mediastinal and left hilar lymph nodes. No visualized pneumothorax. IMPRESSION: 1. Slight progression in diffuse airspace disease throughout the right lung. 2. Unchanged bilateral pleural effusions and left lung base airspace disease/atelectasis. Electronically Signed   By: Narda Rutherford M.D.   On: 08/02/2019 01:18   Dg Chest Port 1 View  Result Date: 07/30/2019 CLINICAL DATA:  ETT. EXAM: PORTABLE CHEST 1 VIEW COMPARISON:  July 30, 2019 FINDINGS: The ETT in left central line are in good position. An NG tube terminates below today's film. No pneumothorax. A layering right pleural effusion is identified, not seen previously. Pulmonary infiltrates, right greater than left, persist. Stable cardiomediastinal silhouette. No pneumothorax. IMPRESSION: 1. Support apparatus as above. Right-sided pleural effusion not seen previously. 2. Bilateral pulmonary infiltrates are worsened in the interval. Electronically Signed   By: Gerome Sam III M.D   On: 07/30/2019 20:50   Dg Chest Port 1 View  Result Date: 07/30/2019 CLINICAL DATA:  Acute respiratory failure. Ventilator support. EXAM: PORTABLE CHEST 1 VIEW COMPARISON:  07/29/2019 FINDINGS: Endotracheal tube tip is 4 cm above the carina. Orogastric or nasogastric tube enters the stomach. Left internal jugular central line tip in the SVC at the azygos level. There is slight improvement in pulmonary infiltrate on the left. Widespread pulmonary infiltrate on the right remains similar. No worsening or new finding. Calcified hilar lymph nodes as seen previously. IMPRESSION: Lines and tubes well positioned. Slight improvement in infiltrate on the left. No change in extensive pulmonary infiltrates on the right. Electronically Signed   By: Paulina Fusi M.D.   On: 07/30/2019  07:22   Dg Chest Port 1 View  Result Date: 07/29/2019 CLINICAL DATA:  Encounter for central line placement EXAM: PORTABLE CHEST 1 VIEW COMPARISON:  Radiograph 07/28/2019 FINDINGS: Left IJ catheter tip terminates at the brachiocephalic-caval confluence. Endotracheal tube terminates in the mid trachea, 3 cm from the carina. Transesophageal tube tip and side port terminates beyond the GE junction with the tip directed towards the gastric antrum, below the level of imaging. Interstitial and airspace opacities throughout the lungs, similar throughout the right lung with some increased perihilar opacity in the left lung. Mediastinal calcifications, likely calcified adenopathy. The aorta is calcified. The remaining cardiomediastinal contours are unremarkable. IMPRESSION: 1. Left IJ catheter tip terminates at the brachiocephalic-caval confluence. 2. Stable satisfactory positioning of the endotracheal and transesophageal tubes. 3. Interstitial and airspace opacities throughout the lungs, similar throughout the right lung with some increased perihilar opacity in the left lung. Electronically Signed   By: Kreg Shropshire M.D.   On: 07/29/2019 00:05   Dg Chest Port 1 View  Result Date: 07/28/2019 CLINICAL DATA:  Hypoxia; ET and OG placement EXAM: PORTABLE CHEST 1 VIEW COMPARISON:  Radiograph 07/08/2019 FINDINGS: Endotracheal tube is positioned low within the trachea terminating 1.5 cm from the carina. Transesophageal  tube tip and side port distal to the GE junction directed towards the gastric antrum. Widespread interstitial and airspace opacities present throughout the right hemithorax and minimally in the left lung base. No pneumothorax or visible effusion. Stable calcified granulomata in the hila. Aortic calcification is noted as well. Cardiomediastinal contours are otherwise unremarkable. No acute osseous or soft tissue abnormality. IMPRESSION: 1. Endotracheal tube is positioned low within the trachea terminating 1.5  cm from the carina. Recommend retraction 2-3 cm to the mid trachea. 2. Transesophageal tube tip and side port distal to the GE junction. 3. Widespread interstitial and airspace opacities throughout the right hemithorax and minimally in the left lung base, could reflect pneumonia or sequela extensive aspiration. These results were called by telephone at the time of interpretation on 07/28/2019 at 4:05 am to provider [facility] , who verbally acknowledged these results. Electronically Signed   By: Lovena Le M.D.   On: 07/28/2019 04:06   Dg Knee Complete 4 Views Left  Result Date: 07/28/2019 CLINICAL DATA:  Left hip fracture EXAM: LEFT KNEE - COMPLETE 4+ VIEW COMPARISON:  None. FINDINGS: No left knee fracture, dislocation or significant joint effusion. No suspicious focal osseous lesion. No significant arthropathy. No radiopaque foreign body. IMPRESSION: No acute osseous abnormality in the left knee. Electronically Signed   By: Ilona Sorrel M.D.   On: 07/28/2019 17:17   Dg Hip Unilat W Or Wo Pelvis 1 View Left  Result Date: 07/28/2019 CLINICAL DATA:  Left hip deformity EXAM: DG HIP (WITH OR WITHOUT PELVIS) 1V*L* COMPARISON:  Radiographs 07/06/2019 FINDINGS: There has been interval re-injury of the left femoral neck now with a highly comminuted periprosthetic fracture pattern extending through the left femoral neck and into the inter trochanteric region of the left femur. Transcervical fixation screw now stands proud of the bone cortex. The lower extremity is varus angulated with extensive overlying soft tissue swelling and a large effusion. Overlying skin staples are noted as well. There are additional postsurgical changes of the right hip post placement a right hip hemiarthroplasty for a transcervical fracture seen comparison images. Articular component is normally located though alignment is incompletely assessed on non dedicated radiographs. IMPRESSION: Comminuted periprosthetic fracture/re-injury  of the left femoral neck and intertrochanteric region with large effusion and overlying swelling. Transcervical fixation hardware stands proud of the cortex. Normal location of the right hip hemiarthroplasty with recent postsurgical changes. Alignment is suboptimally evaluated on these nondedicated images and given patient rotation. These results were called by telephone at the time of interpretation on 07/28/2019 at 4:11 am to provider [facility] , who verbally acknowledged these results. Electronically Signed   By: Lovena Le M.D.   On: 07/28/2019 04:12    Microbiology: Recent Results (from the past 240 hour(s))  Urine culture     Status: None   Collection Time: 07/30/19  9:24 AM   Specimen: In/Out Cath Urine  Result Value Ref Range Status   Specimen Description IN/OUT CATH URINE  Final   Special Requests NONE  Final   Culture   Final    NO GROWTH Performed at [facility], [place] [address] [place], [place] [address]    Report Status 07/31/2019 FINAL  Final  Culture, blood (Routine X 2) w Reflex to ID Panel     Status: None   Collection Time: 07/30/19  9:50 AM   Specimen: BLOOD RIGHT HAND  Result Value Ref Range Status   Specimen Description BLOOD RIGHT HAND  Final  Special Requests   Final    BOTTLES DRAWN AEROBIC ONLY Blood Culture adequate volume   Culture   Final    NO GROWTH 5 DAYS Performed at [facility], [address], [place], [place] [address]    Report Status 08/04/2019 FINAL  Final  Culture, blood (Routine X 2) w Reflex to ID Panel     Status: None   Collection Time: 07/30/19 10:00 AM   Specimen: BLOOD LEFT HAND  Result Value Ref Range Status   Specimen Description BLOOD LEFT HAND  Final   Special Requests   Final    BOTTLES DRAWN AEROBIC ONLY Blood Culture results may not be optimal due to an inadequate volume of blood received in culture bottles   Culture   Final    NO GROWTH 5 DAYS Performed at [facility], [address]  [place], [place] [address]    Report Status 08/04/2019 FINAL  Final  Culture, respiratory (tracheal aspirate)     Status: None   Collection Time: 07/30/19 11:21 AM   Specimen: Tracheal Aspirate; Respiratory  Result Value Ref Range Status   Specimen Description TRACHEAL ASPIRATE  Final   Special Requests NONE  Final   Gram Stain   Final    NO WBC SEEN NO ORGANISMS SEEN Performed at [facility], [address] [place], [place] [address]    Culture FEW STAPHYLOCOCCUS AUREUS  Final   Report Status 08/02/2019 FINAL  Final   Organism ID, Bacteria STAPHYLOCOCCUS AUREUS  Final      Susceptibility   Staphylococcus aureus - MIC*    CIPROFLOXACIN <=0.5 SENSITIVE Sensitive     ERYTHROMYCIN >=8 RESISTANT Resistant     GENTAMICIN <=0.5 SENSITIVE Sensitive     OXACILLIN 0.5 SENSITIVE Sensitive     TETRACYCLINE <=1 SENSITIVE Sensitive     VANCOMYCIN 1 SENSITIVE Sensitive     TRIMETH/SULFA <=10 SENSITIVE Sensitive     CLINDAMYCIN RESISTANT Resistant     RIFAMPIN <=0.5 SENSITIVE Sensitive     Inducible Clindamycin POSITIVE Resistant     * FEW STAPHYLOCOCCUS AUREUS     Labs: Basic Metabolic Panel: Recent Labs  Lab 08/01/19 0441 08/02/19 0205 08/02/19 1938 08/03/19 0459 08/04/19 0303 08/04/19 0708  NA 143 141 140 140 139  --   K 3.3* 2.5* 3.5 3.1* 2.7*  --   CL 109 104 107 107 106  --   CO2 --   GLUCOSE 102* 94 81 91 107*  --   BUN 5* <5*  --   CREATININE 0.74 0.71 0.74 0.90 0.71  --   CALCIUM 8.0* 8.0* 7.9* 7.9* 7.7*  --   MG 2.0  --   --  2.0  --  2.0  PHOS 2.2*  --   --   --  2.5  --    Liver Function Tests: Recent Labs  Lab 08/04/19 0303  ALBUMIN 1.8*   No results for input(s): LIPASE, AMYLASE in the last 168 hours. No results for input(s): AMMONIA in the last 168 hours. CBC: Recent Labs  Lab 08/01/19 0441 08/03/19 0459 08/04/19 0303  WBC 8.2 6.6 6.6  NEUTROABS  --   --  4.8  HGB 10.0* 10.1* 9.7*  HCT 30.6* 31.2* 29.8*  MCV 97.8 96.3 96.1   PLT 244 239 208   Cardiac Enzymes: No results for input(s): CKTOTAL, CKMB, CKMBINDEX, TROPONINI in the last 168 hours. BNP: BNP (last 3  results) No results for input(s): BNP in the last 8760 hours.  ProBNP (last 3 results) No results for input(s): PROBNP in the last 8760 hours.  CBG: Recent Labs  Lab 08/03/19 1929 08/03/19 2308 08/04/19 0321 08/04/19 0729 08/04/19 1119  GLUCAP 102* 116* 105* 99 106*       Signed:  Rhetta Mura MD   [facility] 08/07/2019, 8:53 AM

## 2019-08-07 NOTE — Progress Notes (Signed)
[facility] does not have a bed to offer today after all.  Updated TOC manager.  Was never able to reach the sister yesterday to advise we did have a bed.   Will update tomorrow.  Venia Carbon RN, BSN, [facility] Liaison  [phone]

## 2019-08-08 MED ORDER — MORPHINE BOLUS VIA INFUSION
2.0000 mg | INTRAVENOUS | Status: DC | PRN
  Administered 2019-08-08: 12:00:00 2 mg via INTRAVENOUS
  Filled 2019-08-08: qty 2

## 2019-08-08 NOTE — TOC Transition Note (Signed)
Transition of Care [facility]) - CM/SW Discharge Note   Patient Details  Name: Brian Stanton MRN: [id number] Date of Birth: 1961-08-02  Transition of Care [facility] CM/SW Contact:  Alexander [place], [place] Phone Number: 08/08/2019, 10:27 AM   Clinical Narrative:    Acknowledging pt transfer to 6N- Pt plan is for comfort care. Was alerted by Harmon Pier with [facility] that pt does have a bed available for transfer today. She will complete paperwork. MD paged for updated summary, DNR on the chart and PTAR papers completed.    Final next level of care: [place] Barriers to Discharge: Barriers Resolved   Patient Goals and CMS Choice Patient states their goals for this hospitalization and ongoing recovery are:: comfort care CMS Medicare.gov Compare Post Acute Care list provided to:: (n/a) Choice offered to / list presented to : Sibling  Discharge Placement    Patient chooses bed at: Other - please specify in the comment section below:([facility]) Patient to be transferred to facility by: [place] Name of family member notified: [name] Patient and family notified of of transfer: 08/08/19    Social Determinants of Health ([place]) Interventions     Readmission Risk Interventions Readmission Risk Prevention Plan 08/08/2019  Transportation Screening Not Complete  Transportation Screening Comment from SNF- plan for hospice  PCP or Specialist Appt within 5-7 Days Not Complete  Not Complete comments from SNF- plan for hospice  Home Care Screening Not Complete  Home Care Screening Not Completed Comments from SNF- plan for hospice  Medication Review (RN CM) Not Complete  Med Review comments from SNF- plan for hospice  Some recent data might be hidden

## 2019-08-08 NOTE — Progress Notes (Signed)
AVS, signed printed prescriptions, signed DNR form, and social worker's paperwork placed in discharge packet. Report called and given to Helene Kelp, Therapist, sports at [facility]. PIV remains in place Helene Kelp, RN aware. All questions answered to satisfaction. Awaiting PTAR to transport pt with all belongings.

## 2019-08-08 NOTE — Plan of Care (Signed)
  Problem: Pain Management: Goal: Satisfaction with pain management regimen will improve Outcome: Progressing   

## 2019-08-08 NOTE — Social Work (Signed)
Clinical Social Worker facilitated patient discharge including contacting patient family and facility to confirm patient discharge plans.  Clinical information faxed to facility and family agreeable with plan.  CSW arranged ambulance transport via PTAR to [facility] RN to call [phone]  with report prior to discharge.  Clinical Social Worker will sign off for now as social work intervention is no longer needed. Please consult us again if new need arises.  Jocee Kissick, MSW, LCSWA Clinical Social Worker [phone]  

## 2019-08-08 NOTE — Discharge Summary (Signed)
Physician Discharge Summary  Brian Stanton [id number]:[id number] DOB: [birth year] DOA: 07/28/2019  PCP: Default, Provider, MD No changes to the POC today regarding plan for d/c -patient stabilized to d/c Admit date: 07/28/2019 Discharge date: 08/08/2019  Time spent: 20 minutes  Recommendations for Outpatient Follow-up:  1. Patient transferring to [facility] for end-of-life care  Discharge Diagnoses:  Active Problems:   Acute respiratory failure (HCC)   Pressure injury of skin   Protein-calorie malnutrition, severe   Healthcare-associated pneumonia   Scrotal lesion   Bacteremia due to Staphylococcus aureus   Bacteremia due to Streptococcus   Goals of care, counseling/discussion   Staphylococcal pneumonia ([place])   Palliative care by specialist   Comfort measures only status   Discharge Condition: Guarded  Diet recommendation: Comfort  Filed Weights   08/02/19 0312 08/03/19 0405 08/04/19 0328  Weight: 59.4 kg 52.7 kg 49.7 kg    History of present illness:  52 white 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 Admitted after having nausea vomiting diarrhea and found to have septic shock which required intubation also had recent left hip nailing recent hip surgery September 2020 and was hospitalized for the same He was initially on the ICU service and was transferred to the medical service-he was found to have staph pneumo bacteremia in addition to respiratory failure secondary to staphylococcal pneumonia-infectious disease was consulted with regards to the same Electrolytes were initially aggressively managed however after Goals of care were delineated by palliative medicine it was decided to make him full comfort care He was transitioned to morphine GTT, glycopyrrolate and felt that because of his poor prognosis he would be a good candidate for [facility] and he was transferred there for end-of-life care I have tried to contact his sister today but have not been able to   Discharge  Exam: Vitals:   08/07/19 0940 08/07/19 2210  BP:  101/60  Pulse: 72 70  Resp: (!) 24 16  Temp:  98 F (36.7 C)  SpO2:  (!) 89%    General: Awake gasping respirations does move around a little bit in the bed Cardiovascular: S1-S2 no murmur rub or gallop Respiratory: Clear Incoherent neurologically  Discharge Instructions   Discharge Instructions    Diet - low sodium heart healthy   Complete by: As directed    Increase activity slowly   Complete by: As directed      Allergies as of 08/08/2019   No Known Allergies     Medication List    STOP taking these medications   ALIGN PO   aspirin 325 MG EC tablet   BABY SHAMPOO EX   BLISTEX LIP BALM EX   cholecalciferol 25 MCG (1000 UT) tablet Commonly known as: VITAMIN D3   docusate sodium 100 MG capsule Commonly known as: COLACE   donepezil 5 MG tablet Commonly known as: ARICEPT   Ensure   guaiFENesin 600 MG 12 hr tablet Commonly known as: MUCINEX   hydrocortisone 25 MG suppository Commonly known as: ANUSOL-HC   iron polysaccharides 150 MG capsule Commonly known as: NIFEREX   levothyroxine 88 MCG tablet Commonly known as: SYNTHROID   lubiprostone 24 MCG capsule Commonly known as: AMITIZA   Melatonin 3 MG Tabs   Myrbetriq 25 MG Tb24 tablet Generic drug: mirabegron ER   Namenda XR 28 MG Cp24 24 hr capsule Generic drug: memantine   ondansetron 4 MG tablet Commonly known as: ZOFRAN   promethazine 25 MG suppository Commonly known as: PHENERGAN   silver sulfADIAZINE 1 %  cream Commonly known as: SILVADENE   traMADol 50 MG tablet Commonly known as: ULTRAM   traZODone 50 MG tablet Commonly known as: DESYREL     TAKE these medications   acetaminophen 325 MG tablet Commonly known as: TYLENOL Take 2 tablets (650 mg total) by mouth every 6 (six) hours as needed for mild pain. What changed: when to take this   LORazepam 2 MG/ML concentrated solution Commonly known as: ATIVAN Place 0.5 mLs (1  mg total) under the tongue every 4 (four) hours as needed for anxiety.   morphine 1 mg/mL Soln infusion Inject 1-10 mg/hr into the vein continuous.   scopolamine 1 MG/3DAYS Commonly known as: TRANSDERM-SCOP Place 1 patch (1.5 mg total) onto the skin every 3 (three) days.      No Known Allergies    The results of significant diagnostics from this hospitalization (including imaging, microbiology, ancillary and laboratory) are listed below for reference.    Significant Diagnostic Studies: Ct Hip Left Wo Contrast  Result Date: 07/28/2019 CLINICAL DATA:  Left hip fracture diagnosed 07/05/2019. Mechanism of injury is unknown. Patient admitted with sepsis today. Plain film of the hip demonstrate a periprosthetic fracture. Subsequent encounter. EXAM: CT OF THE LEFT HIP WITHOUT CONTRAST TECHNIQUE: Multidetector CT imaging of the left hip was performed according to the standard protocol. Multiplanar CT image reconstructions were also generated. COMPARISON:  Plain films left hip earlier today intraoperative imaging 07/06/2019 FINDINGS: Bones/Joint/Cartilage Short intramedullary nail and hip screw are in place for fixation of a left hip fracture. The screw is no longer within the femoral head but is directed an almost straight AP orientation. There is a fracture through the femoral neck at the site of screw placement. As seen on the prior plain films, the screw has backed out approximately 4.5 cm relative to the IM nail. There is angulation at the fracture site of approximately 90 degrees and foreshortening of the femur of approximately 4 cm. The greater trochanter is comminuted and medially and posteriorly displaced. Fracture line extends distally to the junction of the metaphysis and diaphysis of the femur. Ligaments Suboptimally assessed by CT. Muscles and Tendons There is extensive edema in the gluteal musculature and in the iliacus. Edema is also present in the right hip adductors. No focal fluid  collection is identified. Soft tissues Extensive stranding is present subcutaneous tissues about the hip. Foley catheter in the urinary bladder noted. IMPRESSION: Status post fixation of a left intertrochanteric fracture. Hip screw is no longer within the femoral head and is now directed in an almost a straight AP orientation. Foreshortening of the left hip and fracture displacement are identified. The greater trochanter is comminuted and medially and posteriorly displaced. Extensive subcutaneous stranding about the visualized left hip and upper leg and multifocal muscle edema. Findings could be related to trauma or surgery but infectious myositis and cellulitis are possible in this patient with sepsis. No intramuscular fluid collection is identified. Electronically Signed   By: Drusilla Kanner M.D.   On: 07/28/2019 13:15   Dg Chest Port 1 View  Result Date: 08/02/2019 CLINICAL DATA:  Hypoxia. EXAM: PORTABLE CHEST 1 VIEW COMPARISON:  07/30/2019 FINDINGS: Endotracheal tube, enteric tube, and left central line have been removed. Slight progression in diffuse airspace disease throughout the right lung in the central and lower lung zone predominant distribution. Right pleural effusion grossly unchanged. Hazy opacity at the left lung base likely combination of pleural fluid and airspace disease/atelectasis. Unchanged heart size and mediastinal contours. Calcified mediastinal and  left hilar lymph nodes. No visualized pneumothorax. IMPRESSION: 1. Slight progression in diffuse airspace disease throughout the right lung. 2. Unchanged bilateral pleural effusions and left lung base airspace disease/atelectasis. Electronically Signed   By: Narda Rutherford M.D.   On: 08/02/2019 01:18   Dg Chest Port 1 View  Result Date: 07/30/2019 CLINICAL DATA:  ETT. EXAM: PORTABLE CHEST 1 VIEW COMPARISON:  July 30, 2019 FINDINGS: The ETT in left central line are in good position. An NG tube terminates below today's film. No  pneumothorax. A layering right pleural effusion is identified, not seen previously. Pulmonary infiltrates, right greater than left, persist. Stable cardiomediastinal silhouette. No pneumothorax. IMPRESSION: 1. Support apparatus as above. Right-sided pleural effusion not seen previously. 2. Bilateral pulmonary infiltrates are worsened in the interval. Electronically Signed   By: Gerome Sam III M.D   On: 07/30/2019 20:50   Dg Chest Port 1 View  Result Date: 07/30/2019 CLINICAL DATA:  Acute respiratory failure. Ventilator support. EXAM: PORTABLE CHEST 1 VIEW COMPARISON:  07/29/2019 FINDINGS: Endotracheal tube tip is 4 cm above the carina. Orogastric or nasogastric tube enters the stomach. Left internal jugular central line tip in the SVC at the azygos level. There is slight improvement in pulmonary infiltrate on the left. Widespread pulmonary infiltrate on the right remains similar. No worsening or new finding. Calcified hilar lymph nodes as seen previously. IMPRESSION: Lines and tubes well positioned. Slight improvement in infiltrate on the left. No change in extensive pulmonary infiltrates on the right. Electronically Signed   By: Paulina Fusi M.D.   On: 07/30/2019 07:22   Dg Chest Port 1 View  Result Date: 07/29/2019 CLINICAL DATA:  Encounter for central line placement EXAM: PORTABLE CHEST 1 VIEW COMPARISON:  Radiograph 07/28/2019 FINDINGS: Left IJ catheter tip terminates at the brachiocephalic-caval confluence. Endotracheal tube terminates in the mid trachea, 3 cm from the carina. Transesophageal tube tip and side port terminates beyond the GE junction with the tip directed towards the gastric antrum, below the level of imaging. Interstitial and airspace opacities throughout the lungs, similar throughout the right lung with some increased perihilar opacity in the left lung. Mediastinal calcifications, likely calcified adenopathy. The aorta is calcified. The remaining cardiomediastinal contours are  unremarkable. IMPRESSION: 1. Left IJ catheter tip terminates at the brachiocephalic-caval confluence. 2. Stable satisfactory positioning of the endotracheal and transesophageal tubes. 3. Interstitial and airspace opacities throughout the lungs, similar throughout the right lung with some increased perihilar opacity in the left lung. Electronically Signed   By: Kreg Shropshire M.D.   On: 07/29/2019 00:05   Dg Chest Port 1 View  Result Date: 07/28/2019 CLINICAL DATA:  Hypoxia; ET and OG placement EXAM: PORTABLE CHEST 1 VIEW COMPARISON:  Radiograph 07/08/2019 FINDINGS: Endotracheal tube is positioned low within the trachea terminating 1.5 cm from the carina. Transesophageal tube tip and side port distal to the GE junction directed towards the gastric antrum. Widespread interstitial and airspace opacities present throughout the right hemithorax and minimally in the left lung base. No pneumothorax or visible effusion. Stable calcified granulomata in the hila. Aortic calcification is noted as well. Cardiomediastinal contours are otherwise unremarkable. No acute osseous or soft tissue abnormality. IMPRESSION: 1. Endotracheal tube is positioned low within the trachea terminating 1.5 cm from the carina. Recommend retraction 2-3 cm to the mid trachea. 2. Transesophageal tube tip and side port distal to the GE junction. 3. Widespread interstitial and airspace opacities throughout the right hemithorax and minimally in the left lung base, could reflect pneumonia  or sequela extensive aspiration. These results were called by telephone at the time of interpretation on 07/28/2019 at 4:05 am to provider [facility] , who verbally acknowledged these results. Electronically Signed   By: Kreg Shropshire M.D.   On: 07/28/2019 04:06   Dg Knee Complete 4 Views Left  Result Date: 07/28/2019 CLINICAL DATA:  Left hip fracture EXAM: LEFT KNEE - COMPLETE 4+ VIEW COMPARISON:  None. FINDINGS: No left knee fracture, dislocation or  significant joint effusion. No suspicious focal osseous lesion. No significant arthropathy. No radiopaque foreign body. IMPRESSION: No acute osseous abnormality in the left knee. Electronically Signed   By: Delbert Phenix M.D.   On: 07/28/2019 17:17   Dg Hip Unilat W Or Wo Pelvis 1 View Left  Result Date: 07/28/2019 CLINICAL DATA:  Left hip deformity EXAM: DG HIP (WITH OR WITHOUT PELVIS) 1V*L* COMPARISON:  Radiographs 07/06/2019 FINDINGS: There has been interval re-injury of the left femoral neck now with a highly comminuted periprosthetic fracture pattern extending through the left femoral neck and into the inter trochanteric region of the left femur. Transcervical fixation screw now stands proud of the bone cortex. The lower extremity is varus angulated with extensive overlying soft tissue swelling and a large effusion. Overlying skin staples are noted as well. There are additional postsurgical changes of the right hip post placement a right hip hemiarthroplasty for a transcervical fracture seen comparison images. Articular component is normally located though alignment is incompletely assessed on non dedicated radiographs. IMPRESSION: Comminuted periprosthetic fracture/re-injury of the left femoral neck and intertrochanteric region with large effusion and overlying swelling. Transcervical fixation hardware stands proud of the cortex. Normal location of the right hip hemiarthroplasty with recent postsurgical changes. Alignment is suboptimally evaluated on these nondedicated images and given patient rotation. These results were called by telephone at the time of interpretation on 07/28/2019 at 4:11 am to provider [facility] , who verbally acknowledged these results. Electronically Signed   By: Kreg Shropshire M.D.   On: 07/28/2019 04:12    Microbiology: Recent Results (from the past 240 hour(s))  Urine culture     Status: None   Collection Time: 07/30/19  9:24 AM   Specimen: In/Out Cath Urine  Result  Value Ref Range Status   Specimen Description IN/OUT CATH URINE  Final   Special Requests NONE  Final   Culture   Final    NO GROWTH Performed at [facility], [address] [place], [place] [address]    Report Status 07/31/2019 FINAL  Final  Culture, blood (Routine X 2) w Reflex to ID Panel     Status: None   Collection Time: 07/30/19  9:50 AM   Specimen: BLOOD RIGHT HAND  Result Value Ref Range Status   Specimen Description BLOOD RIGHT HAND  Final   Special Requests   Final    BOTTLES DRAWN AEROBIC ONLY Blood Culture adequate volume   Culture   Final    NO GROWTH 5 DAYS Performed at [facility], [address] [place], [place] [address]    Report Status 08/04/2019 FINAL  Final  Culture, blood (Routine X 2) w Reflex to ID Panel     Status: None   Collection Time: 07/30/19 10:00 AM   Specimen: BLOOD LEFT HAND  Result Value Ref Range Status   Specimen Description BLOOD LEFT HAND  Final   Special Requests   Final    BOTTLES DRAWN AEROBIC ONLY Blood Culture results may not be optimal  due to an inadequate volume of blood received in culture bottles   Culture   Final    NO GROWTH 5 DAYS Performed at [facility], [address] [place], [place] [address]    Report Status 08/04/2019 FINAL  Final  Culture, respiratory (tracheal aspirate)     Status: None   Collection Time: 07/30/19 11:21 AM   Specimen: Tracheal Aspirate; Respiratory  Result Value Ref Range Status   Specimen Description TRACHEAL ASPIRATE  Final   Special Requests NONE  Final   Gram Stain   Final    NO WBC SEEN NO ORGANISMS SEEN Performed at [facility], [address] [place], [place] [address]    Culture FEW STAPHYLOCOCCUS AUREUS  Final   Report Status 08/02/2019 FINAL  Final   Organism ID, Bacteria STAPHYLOCOCCUS AUREUS  Final      Susceptibility   Staphylococcus aureus - MIC*    CIPROFLOXACIN <=0.5 SENSITIVE Sensitive     ERYTHROMYCIN >=8 RESISTANT Resistant     GENTAMICIN  <=0.5 SENSITIVE Sensitive     OXACILLIN 0.5 SENSITIVE Sensitive     TETRACYCLINE <=1 SENSITIVE Sensitive     VANCOMYCIN 1 SENSITIVE Sensitive     TRIMETH/SULFA <=10 SENSITIVE Sensitive     CLINDAMYCIN RESISTANT Resistant     RIFAMPIN <=0.5 SENSITIVE Sensitive     Inducible Clindamycin POSITIVE Resistant     * FEW STAPHYLOCOCCUS AUREUS     Labs: Basic Metabolic Panel: Recent Labs  Lab 08/02/19 0205 08/02/19 1938 08/03/19 0459 08/04/19 0303 08/04/19 0708  NA 141 140 140 139  --   K 2.5* 3.5 3.1* 2.7*  --   CL 104 107 107 106  --   CO2 26 23 25 24   --   GLUCOSE 94 81 91 107*  --   BUN 6 6 5* <5*  --   CREATININE 0.71 0.74 0.90 0.71  --   CALCIUM 8.0* 7.9* 7.9* 7.7*  --   MG  --   --  2.0  --  2.0  PHOS  --   --   --  2.5  --    Liver Function Tests: Recent Labs  Lab 08/04/19 0303  ALBUMIN 1.8*   No results for input(s): LIPASE, AMYLASE in the last 168 hours. No results for input(s): AMMONIA in the last 168 hours. CBC: Recent Labs  Lab 08/03/19 0459 08/04/19 0303  WBC 6.6 6.6  NEUTROABS  --  4.8  HGB 10.1* 9.7*  HCT 31.2* 29.8*  MCV 96.3 96.1  PLT 239 208   Cardiac Enzymes: No results for input(s): CKTOTAL, CKMB, CKMBINDEX, TROPONINI in the last 168 hours. BNP: BNP (last 3 results) No results for input(s): BNP in the last 8760 hours.  ProBNP (last 3 results) No results for input(s): PROBNP in the last 8760 hours.  CBG: Recent Labs  Lab 08/03/19 1929 08/03/19 2308 08/04/19 0321 08/04/19 0729 08/04/19 1119  GLUCAP 102* 116* 105* 99 106*       Signed:  Rhetta MuraJai-Gurmukh Jamea Robicheaux MD   [facility] 08/08/2019, 10:41 AM

## 2019-08-08 NOTE — Progress Notes (Signed)
150mL wasted in stericycle from morphine drip with Davina Poke, RN.

## 2019-08-14 DEATH — deceased

## 2020-12-21 IMAGING — CT CT CERVICAL SPINE W/O CM
3 of 8 series · 11 of 33 positions shown, 12 images · non-contrast
Comparison: None.

CLINICAL DATA: Altered mental status.  No known injury

EXAM:
CT HEAD WITHOUT CONTRAST
CT CERVICAL SPINE WITHOUT CONTRAST
TECHNIQUE: Multidetector CT imaging of the head and cervical spine was
performed following the standard protocol without intravenous
contrast. Multiplanar CT image reconstructions of the cervical spine
were also generated.

[Series 6: orthogonal bone · axial · 0.23mm/px · z∈[+1470,+1570]mm · 3 of 118 slices shown, 4 images]
[im 30/118  soft-tissue]
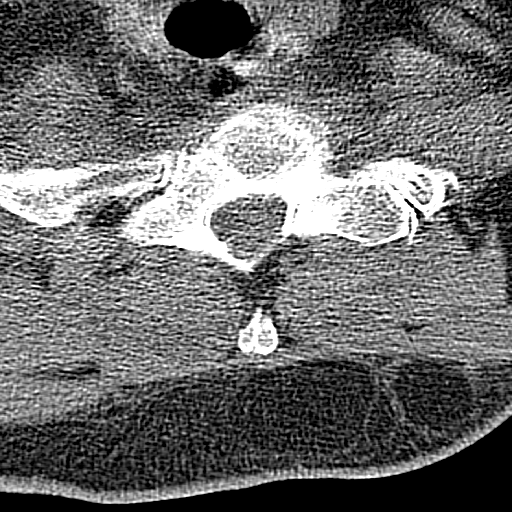
[im 30/118  bone]
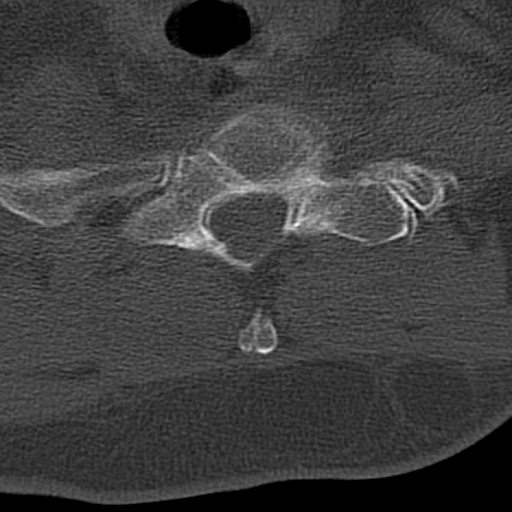
[im 59/118  bone]
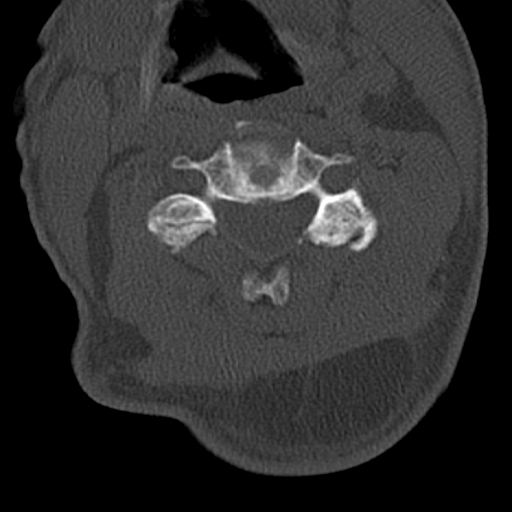
[im 88/118  bone]
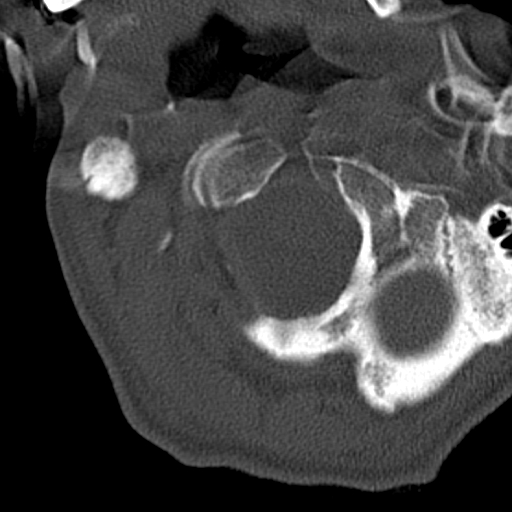

[Series 7: coronal bone · coronal · 0.23mm/px · 3 of 61 slices shown]
[im 6/61  bone]
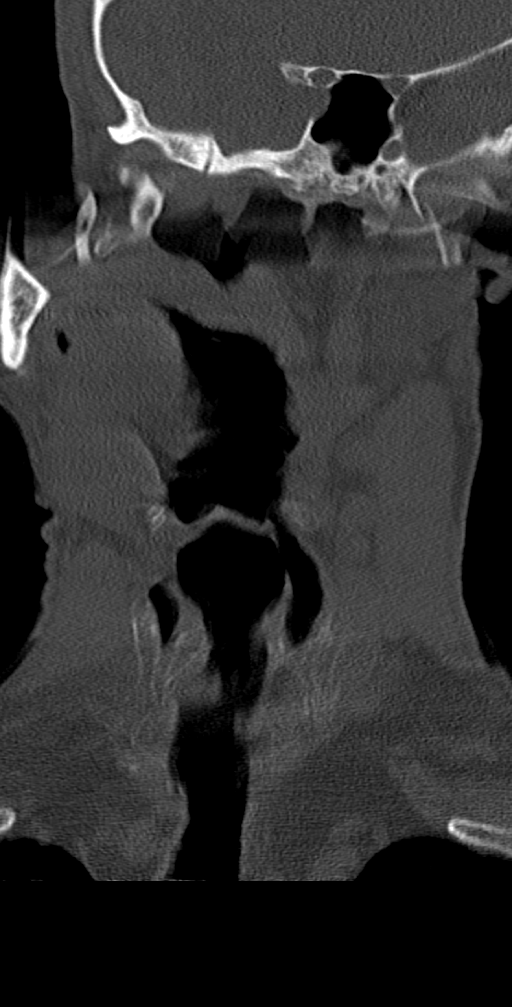
[im 31/61  bone]
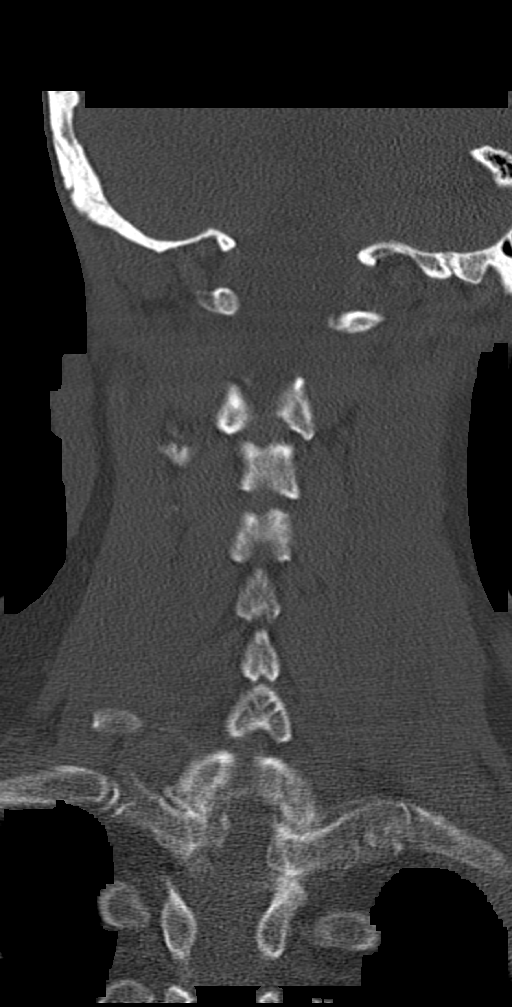
[im 56/61  bone]
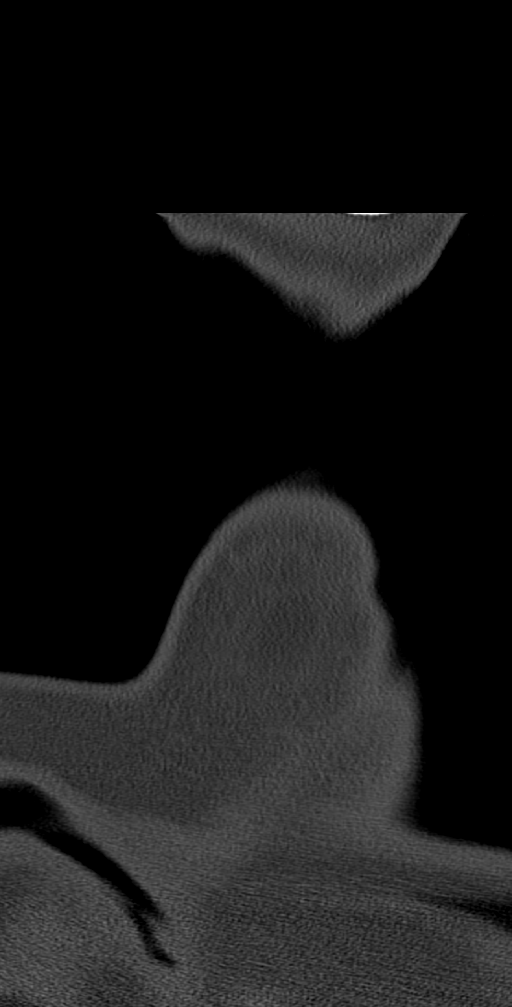

[Series 8: sagittal bone · sagittal · 0.23mm/px · 5 of 61 slices shown]
[im 11/61  bone]
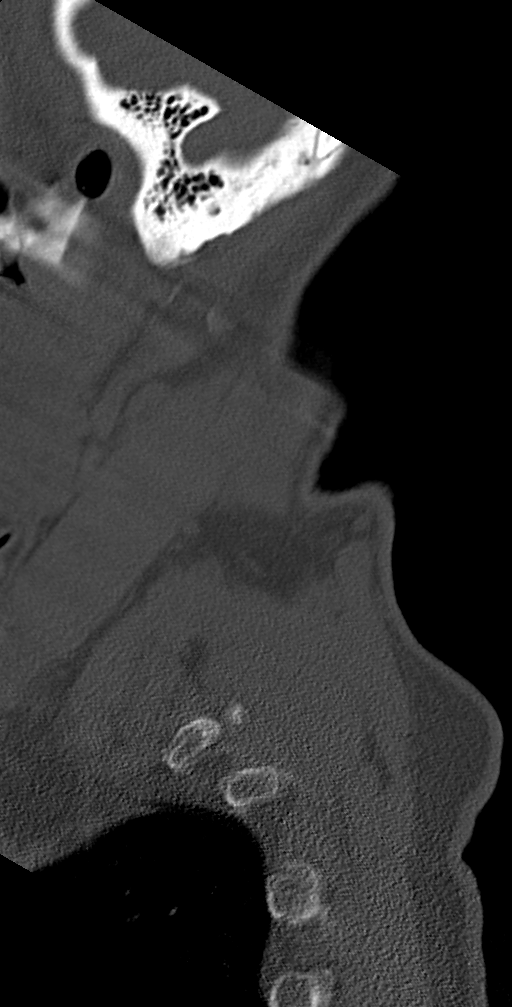
[im 21/61  bone]
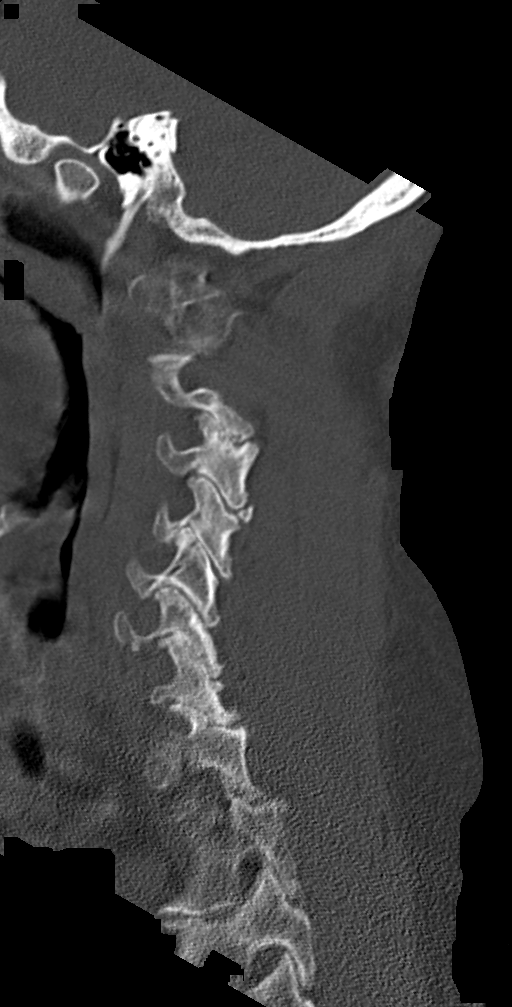
[im 31/61  bone]
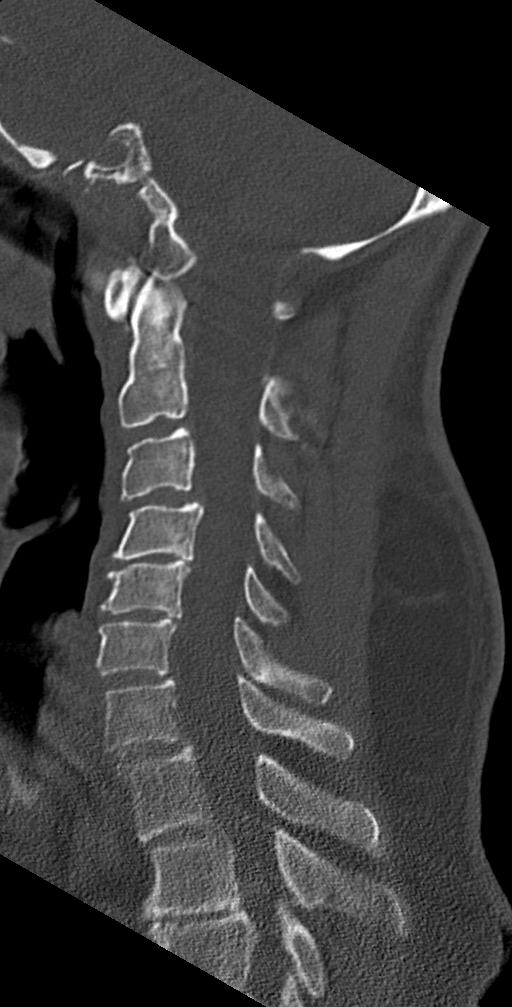
[im 41/61  bone]
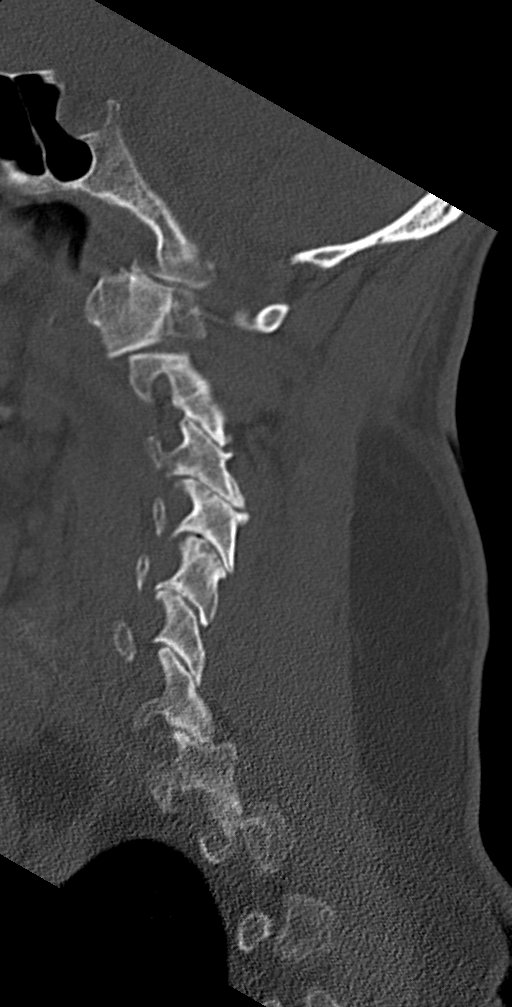
[im 51/61  bone]
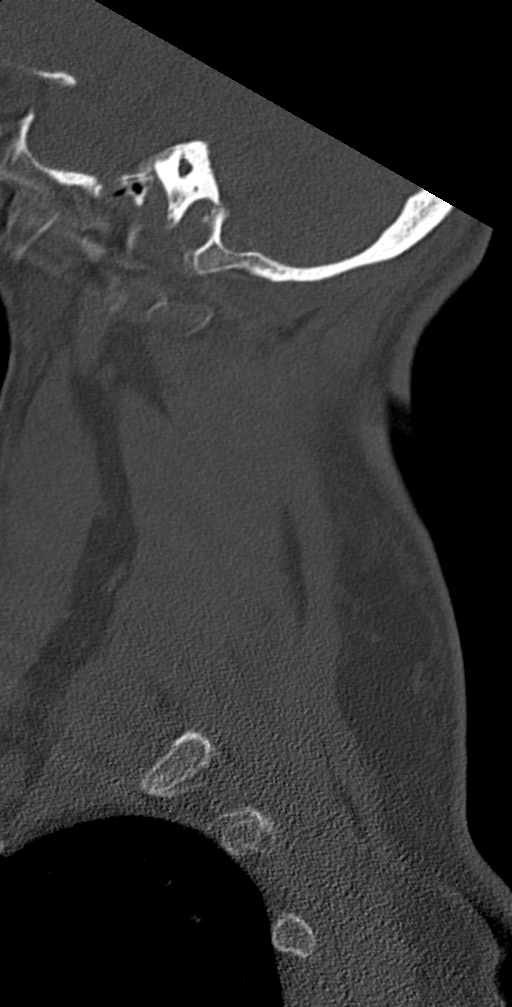

[11 of 33 positions shown; findings below may reference images not displayed]

FINDINGS: CT HEAD FINDINGS

Brain: Motion degraded study.  Multiple repeats

Moderate atrophy without hydrocephalus. No acute infarct. Negative
for acute hemorrhage or mass. Benign-appearing calcification basal
ganglia bilaterally.

Vascular: Negative for hyperdense vessel

Skull: Negative

Sinuses/Orbits: Mild mucosal edema left maxillary sinus likely
odontogenic with periapical abscess left upper teeth.

Other: None

CT CERVICAL SPINE FINDINGS

Alignment: 2 mm anterolisthesis C3-4 and C7-T1. Mild retrolisthesis
C4-5 and C5-6. Mild anterolisthesis C6-7.

Skull base and vertebrae: Negative for cervical spine fracture

Soft tissues and spinal canal: Fat containing mass in the
subcutaneous tissues of the posterior neck measures approximately
6.6 x 3.1 x 8.3 cm. There is stranding within the fatty mass as well
as stranding in the surrounding tissues.

Disc levels: Multilevel disc and facet degeneration throughout the
cervical spine. Multilevel foraminal stenosis due to spurring.

Upper chest: Negative

Other: Motion degraded study.  Multiple repeat images.
IMPRESSION: 1. Motion degraded CT head and cervical spine
2. No acute intracranial abnormality.  Moderate atrophy.
3. Cervical spondylosis.  Negative for fracture.
4. Large fat containing mass in the subcutaneous tissues posterior
neck. This may represent a lipoma or liposarcoma. Physical
examination and close follow-up recommended.

## 2020-12-21 IMAGING — CR DG KNEE COMPLETE 4+V*R*
4 series · 4 of 4 positions shown · non-contrast
Comparison: None.

CLINICAL DATA: Right knee pain

EXAM:
RIGHT KNEE - COMPLETE 4+ VIEW

[t knee obl right (1 of 2)]
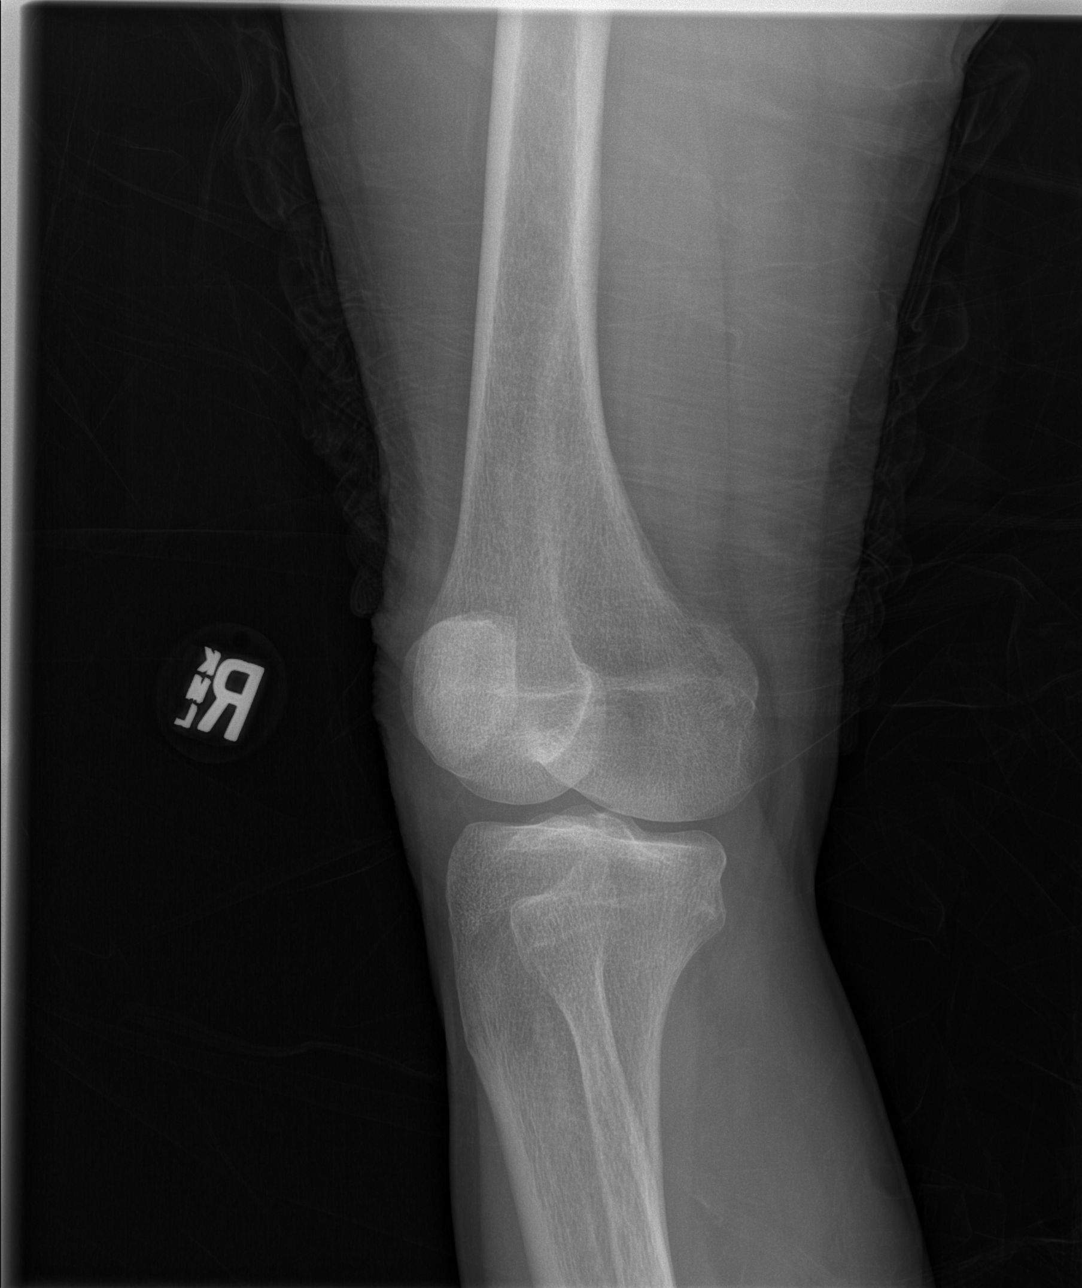

[t knee ap right]
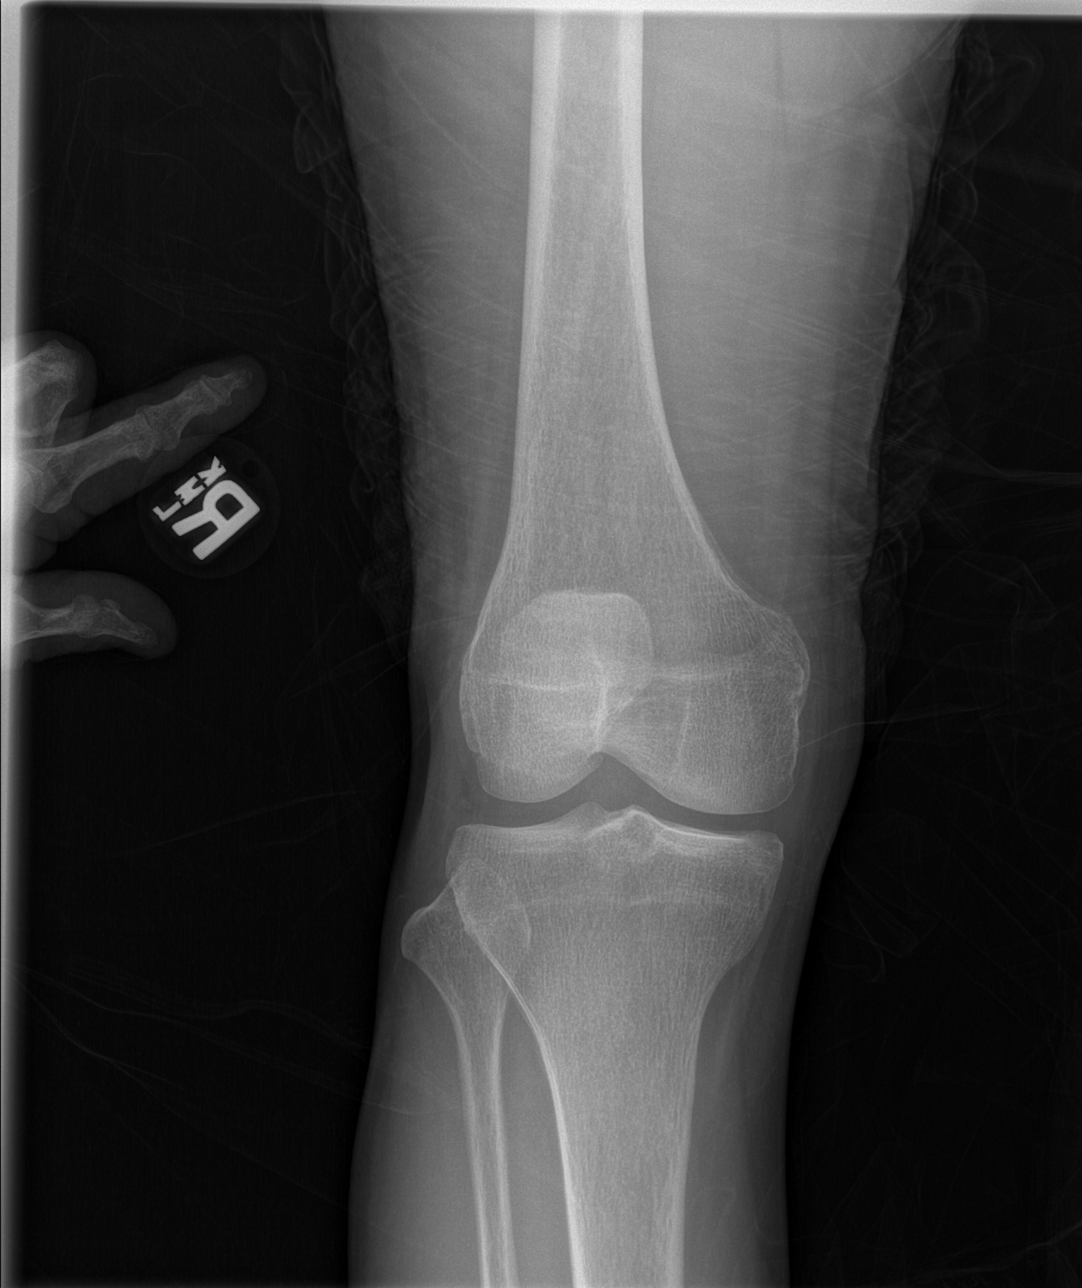

[t knee obl right (2 of 2)]
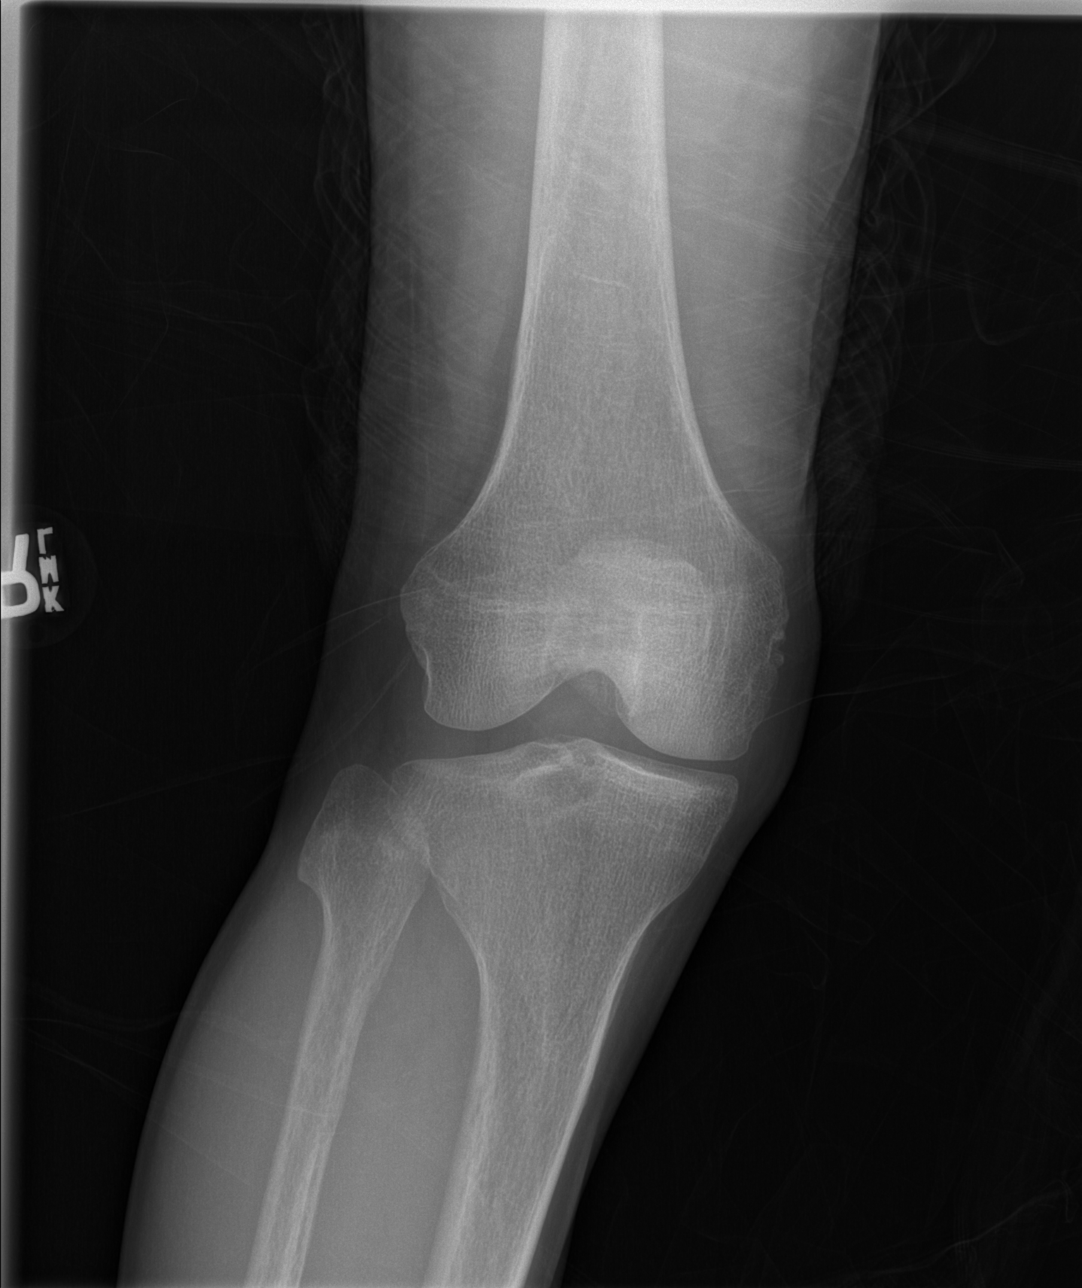

[x knee lat right]
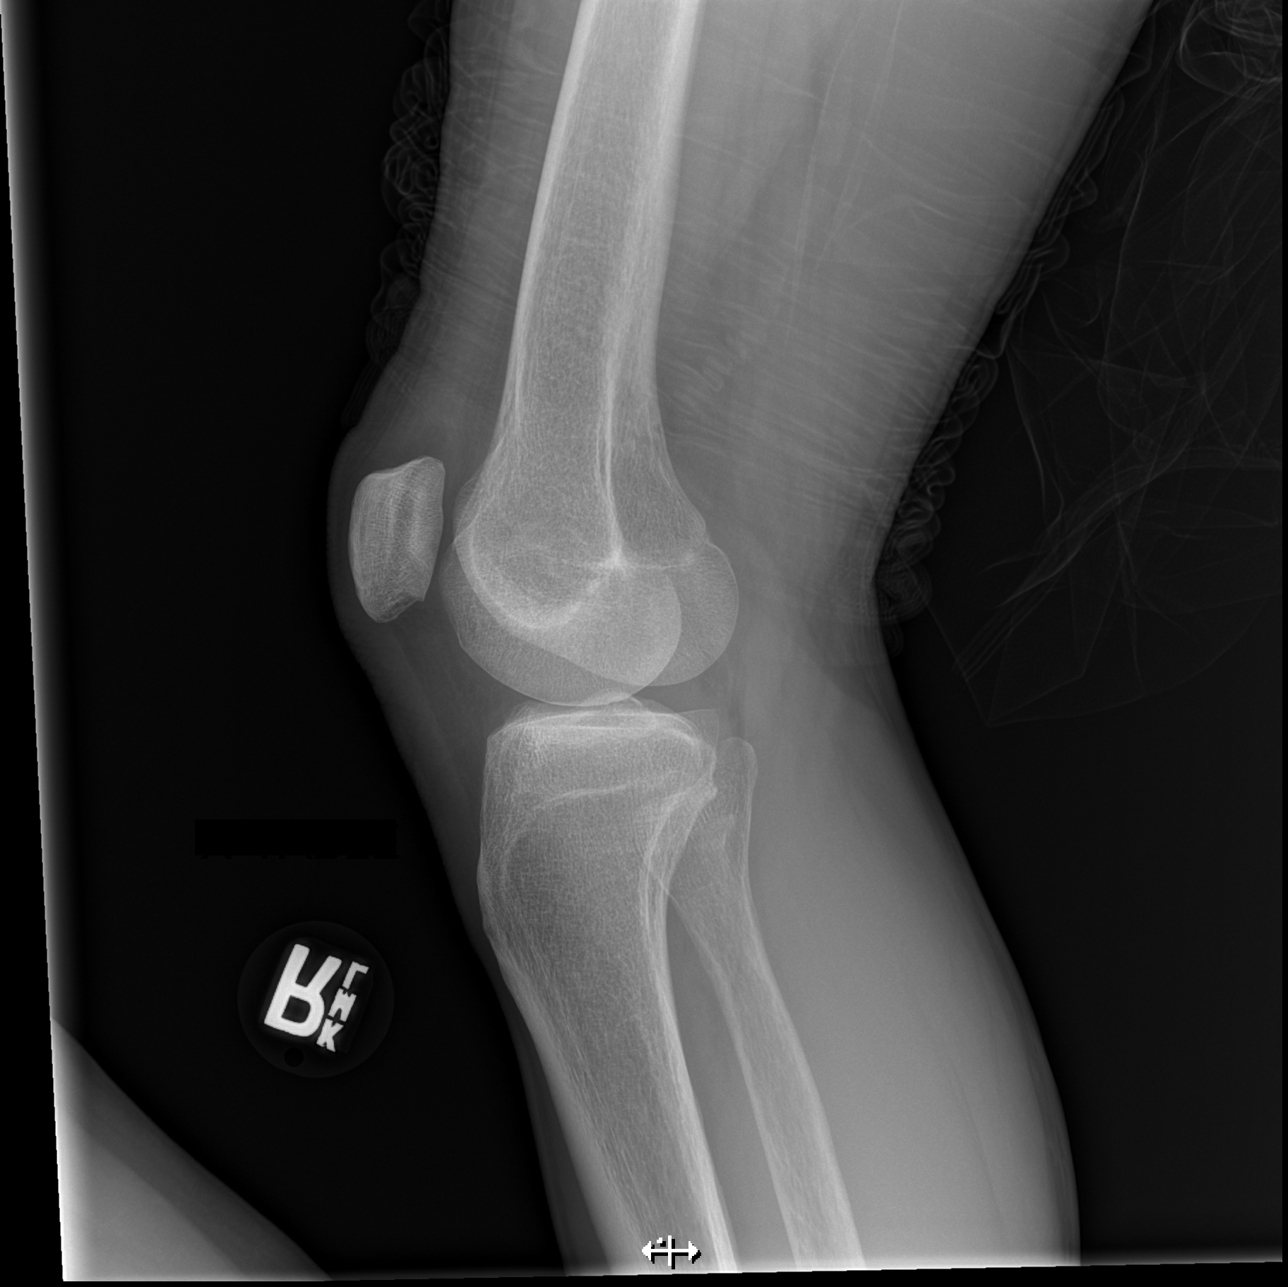

[4 of 4 positions shown; findings below may reference images not displayed]

FINDINGS: No evidence of fracture, dislocation, or joint effusion. No evidence
of arthropathy or other focal bone abnormality. Soft tissues are
unremarkable.
IMPRESSION: Negative.

## 2021-01-13 IMAGING — DX DG CHEST 1V PORT
1 series · 1 of 1 positions shown · non-contrast
Comparison: Radiograph 07/08/2019

CLINICAL DATA: Hypoxia; ET and OG placement

EXAM:
PORTABLE CHEST 1 VIEW

[chest]
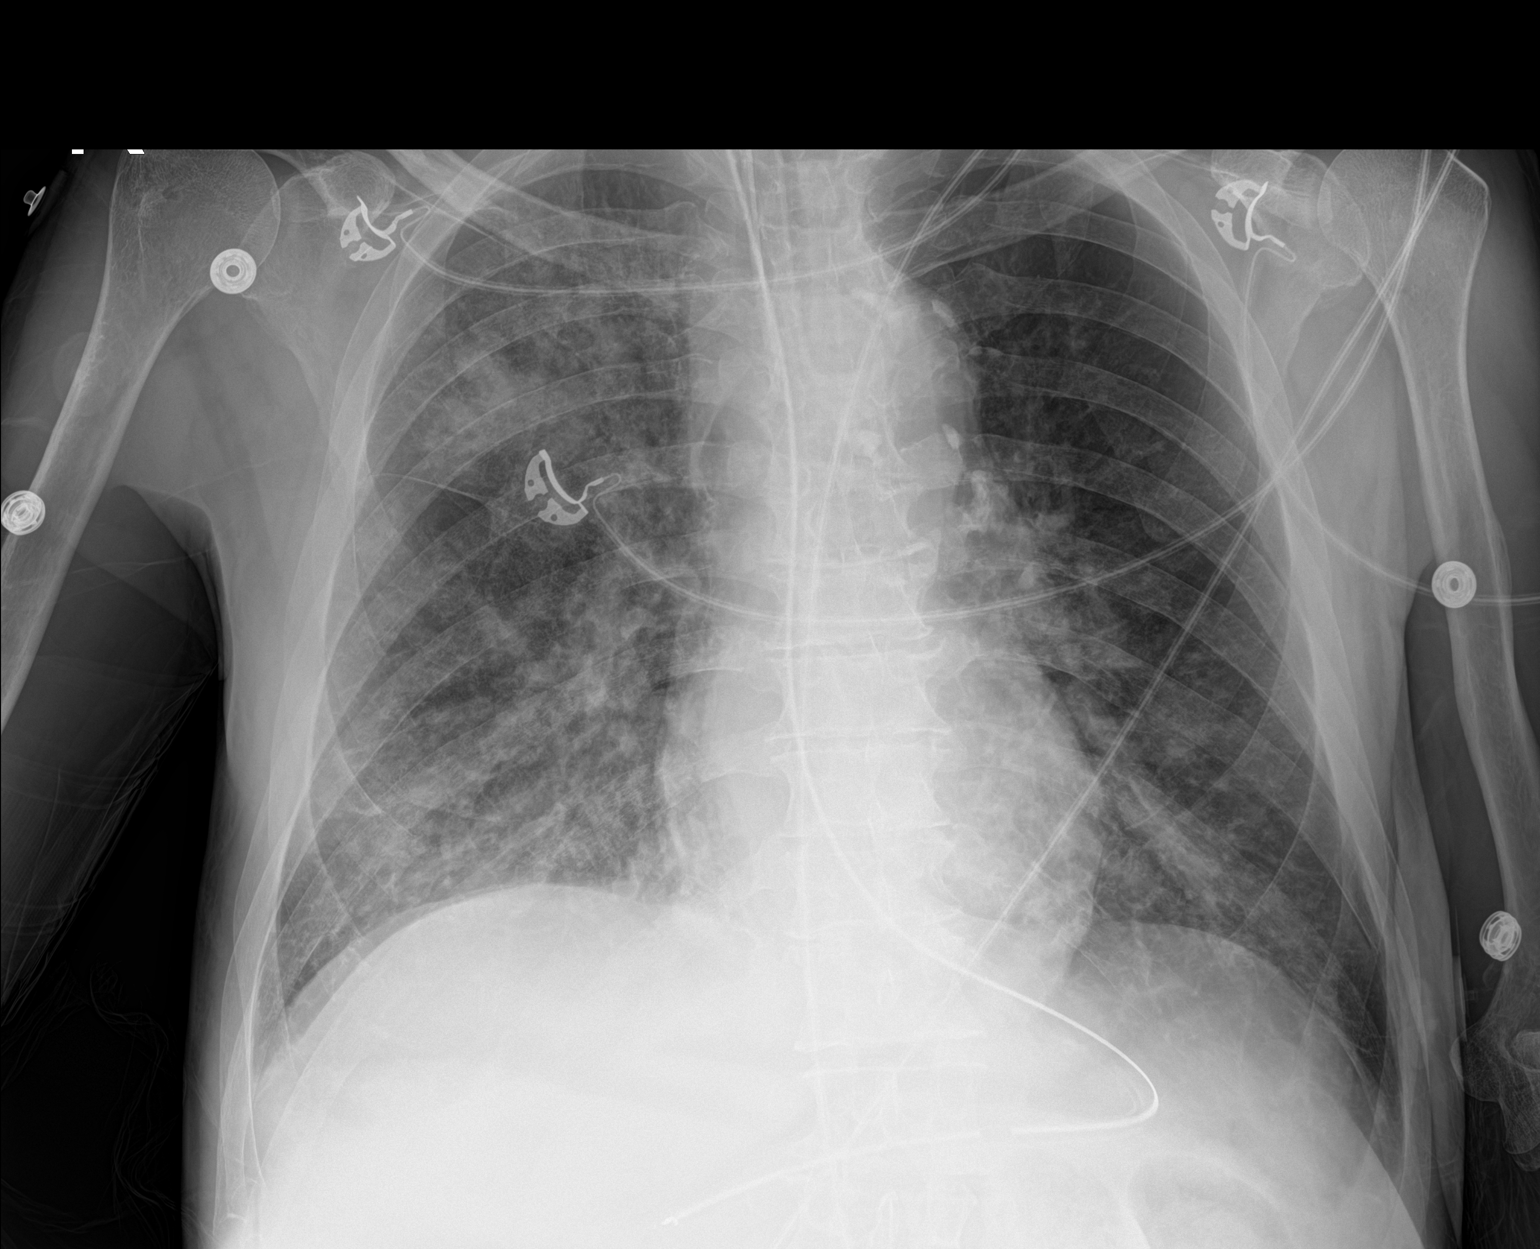

[1 of 1 positions shown; findings below may reference images not displayed]

FINDINGS: Endotracheal tube is positioned low within the trachea terminating
1.5 cm from the carina. Transesophageal tube tip and side port
distal to the GE junction directed towards the gastric antrum.

Widespread interstitial and airspace opacities present throughout
the right hemithorax and minimally in the left lung base. No
pneumothorax or visible effusion. Stable calcified granulomata in
the hila. Aortic calcification is noted as well. Cardiomediastinal
contours are otherwise unremarkable. No acute osseous or soft tissue
abnormality.
IMPRESSION: 1. Endotracheal tube is positioned low within the trachea
terminating 1.5 cm from the carina. Recommend retraction 2-3 cm to
the mid trachea.
2. Transesophageal tube tip and side port distal to the GE junction.
3. Widespread interstitial and airspace opacities throughout the
right hemithorax and minimally in the left lung base, could reflect
pneumonia or sequela extensive aspiration.

These results were called by telephone at the time of interpretation
on 07/28/2019 at [DATE] to provider MUKHTYAR SWEETS , who verbally
acknowledged these results.
